# Patient Record
Sex: Male | Born: 1945
Health system: Southern US, Community
[De-identification: ages and names within clinical notes are randomized; demographics above are authoritative.]

## PROBLEM LIST (undated history)

## (undated) DIAGNOSIS — R21 Rash and other nonspecific skin eruption: Secondary | ICD-10-CM

## (undated) DIAGNOSIS — K219 Gastro-esophageal reflux disease without esophagitis: Secondary | ICD-10-CM

## (undated) DIAGNOSIS — H9110 Presbycusis, unspecified ear: Secondary | ICD-10-CM

## (undated) DIAGNOSIS — G3184 Mild cognitive impairment, so stated: Secondary | ICD-10-CM

## (undated) DIAGNOSIS — T7840XA Allergy, unspecified, initial encounter: Secondary | ICD-10-CM

## (undated) DIAGNOSIS — A048 Other specified bacterial intestinal infections: Secondary | ICD-10-CM

## (undated) DIAGNOSIS — K648 Other hemorrhoids: Secondary | ICD-10-CM

## (undated) DIAGNOSIS — Z0000 Encounter for general adult medical examination without abnormal findings: Secondary | ICD-10-CM

## (undated) DIAGNOSIS — F1910 Other psychoactive substance abuse, uncomplicated: Secondary | ICD-10-CM

## (undated) DIAGNOSIS — Z86010 Personal history of colonic polyps: Secondary | ICD-10-CM

## (undated) DIAGNOSIS — D179 Benign lipomatous neoplasm, unspecified: Secondary | ICD-10-CM

## (undated) DIAGNOSIS — H547 Unspecified visual loss: Secondary | ICD-10-CM

## (undated) DIAGNOSIS — F17200 Nicotine dependence, unspecified, uncomplicated: Secondary | ICD-10-CM

## (undated) DIAGNOSIS — B182 Chronic viral hepatitis C: Secondary | ICD-10-CM

## (undated) DIAGNOSIS — M25539 Pain in unspecified wrist: Secondary | ICD-10-CM

## (undated) DIAGNOSIS — H9313 Tinnitus, bilateral: Secondary | ICD-10-CM

## (undated) DIAGNOSIS — R634 Abnormal weight loss: Secondary | ICD-10-CM

## (undated) DIAGNOSIS — M1990 Unspecified osteoarthritis, unspecified site: Secondary | ICD-10-CM

## (undated) HISTORY — DX: Benign lipomatous neoplasm, unspecified: D17.9

## (undated) HISTORY — DX: Chronic viral hepatitis C: B18.2

## (undated) HISTORY — DX: Allergy, unspecified, initial encounter: T78.40XA

## (undated) HISTORY — DX: Rash and other nonspecific skin eruption: R21

## (undated) HISTORY — DX: Other psychoactive substance abuse, uncomplicated: F19.10

## (undated) HISTORY — DX: Unspecified visual loss: H54.7

## (undated) HISTORY — DX: Pain in unspecified wrist: M25.539

## (undated) HISTORY — DX: Other hemorrhoids: K64.8

## (undated) HISTORY — DX: Gastro-esophageal reflux disease without esophagitis: K21.9

## (undated) HISTORY — DX: Personal history of colonic polyps: Z86.010

## (undated) HISTORY — DX: Presbycusis, unspecified ear: H91.10

## (undated) HISTORY — DX: Mild cognitive impairment of uncertain or unknown etiology: G31.84

## (undated) HISTORY — DX: Unspecified osteoarthritis, unspecified site: M19.90

## (undated) HISTORY — DX: Abnormal weight loss: R63.4

## (undated) HISTORY — PX: COLONOSCOPY: SHX174

## (undated) HISTORY — DX: Encounter for general adult medical examination without abnormal findings: Z00.00

## (undated) HISTORY — DX: Nicotine dependence, unspecified, uncomplicated: F17.200

## (undated) HISTORY — DX: Tinnitus, bilateral: H93.13

## (undated) HISTORY — PX: POLYPECTOMY: SHX149

## (undated) HISTORY — DX: Other specified bacterial intestinal infections: A04.8

---

## 1988-04-15 HISTORY — PX: OTHER SURGICAL HISTORY: SHX169

## 2000-08-07 ENCOUNTER — Encounter: Payer: Self-pay | Admitting: Emergency Medicine

## 2000-08-07 ENCOUNTER — Emergency Department (HOSPITAL_COMMUNITY): Admission: EM | Admit: 2000-08-07 | Discharge: 2000-08-07 | Payer: Self-pay | Admitting: Emergency Medicine

## 2006-06-30 ENCOUNTER — Emergency Department (HOSPITAL_COMMUNITY): Admission: EM | Admit: 2006-06-30 | Discharge: 2006-06-30 | Payer: Self-pay | Admitting: Emergency Medicine

## 2008-11-07 ENCOUNTER — Emergency Department (HOSPITAL_COMMUNITY): Admission: EM | Admit: 2008-11-07 | Discharge: 2008-11-07 | Payer: Self-pay | Admitting: Emergency Medicine

## 2009-05-25 ENCOUNTER — Encounter: Payer: Self-pay | Admitting: Internal Medicine

## 2009-06-02 ENCOUNTER — Encounter (INDEPENDENT_AMBULATORY_CARE_PROVIDER_SITE_OTHER): Payer: Self-pay | Admitting: *Deleted

## 2009-06-05 ENCOUNTER — Ambulatory Visit: Payer: Self-pay | Admitting: Internal Medicine

## 2009-06-19 ENCOUNTER — Ambulatory Visit: Payer: Self-pay | Admitting: Internal Medicine

## 2009-06-19 DIAGNOSIS — Z860100 Personal history of colon polyps, unspecified: Secondary | ICD-10-CM | POA: Insufficient documentation

## 2009-06-27 ENCOUNTER — Encounter: Payer: Self-pay | Admitting: Internal Medicine

## 2010-05-15 NOTE — Procedures (Signed)
Summary: Colonoscopy  Patient: Ryan Ortega Note: All result statuses are Final unless otherwise noted.  Tests: (1) Colonoscopy (COL)   COL Colonoscopy           DONE (C)     [facility]     [address]     [place], [place]  [address]           COLONOSCOPY PROCEDURE REPORT           PATIENT:  [name], [name]  MR#:  [id number]     BIRTHDATE:  24-Mar-1946, 63 yrs. old  GENDER:  male           ENDOSCOPIST:  Iva Boop, MD, [facility]     Referred by:  Doran Durand, M.D.           PROCEDURE DATE:  06/19/2009     PROCEDURE:  Colonoscopy with snare polypectomy ADDENDUM: and     biopsy     ASA CLASS:  Class I     INDICATIONS:  Elevated Risk Screening mother had colon cancer in     40's           MEDICATIONS:   Fentanyl 75 mcg IV, Versed 6 mg IV           DESCRIPTION OF PROCEDURE:   After the risks benefits and     alternatives of the procedure were thoroughly explained, informed     consent was obtained.  Digital rectal exam was performed and     revealed no abnormalities and normal prostate.   The LB CF-[id number]     [id number] endoscope was introduced through the anus and advanced to     the cecum, which was identified by both the appendix and ileocecal     valve, without limitations.  The quality of the prep was     excellent, using MoviPrep.  The instrument was then slowly     withdrawn as the colon was fully examined.     Insertion: 2:10 minutes Withdrawal: 9:34 minutes     <<PROCEDUREIMAGES>>           FINDINGS:  A diminutive polyp was found in the descending colon.     It was 3 mm in size. The polyp was removed using cold biopsy     forceps.  A sessile polyp was found in the sigmoid colon. It was 1     cm in size. Polyp was snared, then cauterized with monopolar     cautery. Retrieval was successful. snare polyp  Moderate     diverticulosis was found in the sigmoid colon.  A diverticulum was     found in the cecum.  This was otherwise a normal examination of  the colon.   Retroflexed views in the rectum revealed internal     hemorrhoids.    The scope was then withdrawn from the patient and     the procedure completed.           COMPLICATIONS:  None           ENDOSCOPIC IMPRESSION:     1) 1 cm sessile polyp in the sigmoid colon - removed     2) 3 mm diminutive polyp in the descending colon - removed     3) Moderate diverticulosis in the sigmoid colon     4) Diverticulum in the cecum     5) Internal hemorrhoids     6) Otherwise normal examination, excellent prep     RECOMMENDATIONS:  1) No aspirin or NSAID's for 2 weeks           REPEAT EXAM:  In for Colonoscopy, pending biopsy results.           Iva Boop, MD, [facility]           CC:  Doran Durand MD     The Patient           n.     REVISED:  06/19/2009 01:03 PM     eSIGNED:   Iva Boop at 06/19/2009 01:03 PM           Freddi Starr, [id number]  Note: An exclamation mark (!) indicates a result that was not dispersed into the flowsheet. Document Creation Date: 06/19/2009 1:03 PM _______________________________________________________________________  (1) Order result status: Final Collection or observation date-time: 06/19/2009 12:49 Requested date-time:  Receipt date-time:  Reported date-time:  Referring Physician:   Ordering Physician: Stan Head [phone]) Specimen Source:  Source: Launa Grill Order Number: [id number] Lab site:   Appended Document: Colonoscopy     Procedures Next Due Date:    Colonoscopy: 06/2012

## 2010-05-15 NOTE — Miscellaneous (Signed)
Summary: previsit/rm  Clinical Lists Changes  Medications: Added new medication of MOVIPREP 100 GM  SOLR (PEG-KCL-NACL-NASULF-NA ASC-C) As per prep instructions. - Signed Rx of MOVIPREP 100 GM  SOLR (PEG-KCL-NACL-NASULF-NA ASC-C) As per prep instructions.;  #1 x 0;  Signed;  Entered by: Sherren Kerns RN;  Authorized by: Iva Boop MD, [facility];  Method used: [facility]  [address] [phone]*, [address] [place], [place], [place]  [id number], Ph: [phone] or [phone], Fax: [phone] Observations: Added new observation of ALLERGY REV: Done (06/05/2009 14:47) Added new observation of NKA: T (06/05/2009 14:47)    Prescriptions: MOVIPREP 100 GM  SOLR (PEG-KCL-NACL-NASULF-NA ASC-C) As per prep instructions.  #1 x 0   Entered by:   Sherren Kerns RN   Authorized by:   Iva Boop MD, [facility]   Signed by:   Sherren Kerns RN on 06/05/2009   Method used:   [facility]        [facility]  [address] [phone]* (retail)       [address]       [place], [place]  [id number]       Ph: [phone] or [phone]       Fax: [phone]   RxID:   [id number]

## 2010-05-15 NOTE — Letter (Signed)
Summary: [facility] Instructions  [facility]  [address] [place], [place] [address]   Phone: [phone]  Fax: [phone]       Ryan Ortega    03/08/1946    MRN: [id number]        Procedure Day Dorna Bloom:  Duanne Limerick  11:30AM     Arrival Time:  10:30AM     Procedure Time:  11:30AM     Location of Procedure:                    Juliann Pares _  [facility] (4th Floor)   PREPARATION FOR COLONOSCOPY WITH MOVIPREP   Starting 5 days prior to your procedure 06/14/09 do not eat nuts, seeds, popcorn, corn, beans, peas,  salads, or any raw vegetables.  Do not take any fiber supplements (e.g. Metamucil, Citrucel, and Benefiber).  THE DAY BEFORE YOUR PROCEDURE         DATE: 06/18/09  DAY: SUNDAY  1.  Drink clear liquids the entire day-NO SOLID FOOD  2.  Do not drink anything colored red or purple.  Avoid juices with pulp.  No orange juice.  3.  Drink at least 64 oz. (8 glasses) of fluid/clear liquids during the day to prevent dehydration and help the prep work efficiently.  CLEAR LIQUIDS INCLUDE: Water Jello Ice Popsicles Tea (sugar ok, no milk/cream) Powdered fruit flavored drinks Coffee (sugar ok, no milk/cream) Gatorade Juice: apple, white grape, white cranberry  Lemonade Clear bullion, consomm, broth Carbonated beverages (any kind) Strained chicken noodle soup Hard Candy                             4.  In the morning, mix first dose of MoviPrep solution:    Empty 1 Pouch A and 1 Pouch B into the disposable container    Add lukewarm drinking water to the top line of the container. Mix to dissolve    Refrigerate (mixed solution should be used within 24 hrs)  5.  Begin drinking the prep at 5:00 p.m. The MoviPrep container is divided by 4 marks.   Every 15 minutes drink the solution down to the next mark (approximately 8 oz) until the full liter is complete.   6.  Follow completed prep with 16 oz of clear liquid of your choice (Nothing red or purple).   Continue to drink clear liquids until bedtime.  7.  Before going to bed, mix second dose of MoviPrep solution:    Empty 1 Pouch A and 1 Pouch B into the disposable container    Add lukewarm drinking water to the top line of the container. Mix to dissolve    Refrigerate  THE DAY OF YOUR PROCEDURE      DATE: 06/19/09  GBT:DVVOHY  Beginning at 6:30AM (5 hours before procedure):         1. Every 15 minutes, drink the solution down to the next mark (approx 8 oz) until the full liter is complete.  2. Follow completed prep with 16 oz. of clear liquid of your choice.    3. You may drink clear liquids until 9:30AM (2 HOURS BEFORE PROCEDURE).   MEDICATION INSTRUCTIONS  Unless otherwise instructed, you should take regular prescription medications with a small sip of water   as early as possible the morning of your procedure.      Additional medication instructions:  n/a         OTHER  INSTRUCTIONS  You will need a responsible adult at least 66 years of age to accompany you and drive you home.   This person must remain in the waiting room during your procedure.  Wear loose fitting clothing that is easily removed.  Leave jewelry and other valuables at home.  However, you may wish to bring a book to read or  an iPod/MP3 player to listen to music as you wait for your procedure to start.  Remove all body piercing jewelry and leave at home.  Total time from sign-in until discharge is approximately 2-3 hours.  You should go home directly after your procedure and rest.  You can resume normal activities the  day after your procedure.  The day of your procedure you should not:   Drive   Make legal decisions   Operate machinery   Drink alcohol   Return to work  You will receive specific instructions about eating, activities and medications before you leave.    The above instructions have been reviewed and explained to me by   Sherren Kerns RN  June 05, 2009 3:13  PM      I fully understand and can verbalize these instructions _____________________________ Date _________

## 2010-05-15 NOTE — Letter (Signed)
Summary: Patient Notice- Polyp Results  [facility]  [address] [place], [place] [address]   Phone: [phone]  Fax: [phone]        June 27, 2009 MRN: [id number]    Ryan Ortega [address] [place], [place]  [address]    Dear Ryan Ortega,  One of the polyps removed from your colon was adenomatous. This means that it was pre-cancerous or that  they had the potential to change into cancer over time. The other polyp was not a true colon polyp.  I recommend that you have a repeat colonoscopy in 3 years to determine if you have developed any new polyps over time. If you develop any new rectal bleeding, abdominal pain or significant bowel habit changes, please contact us before then.  In addition to repeating colonoscopy, changing health habits may reduce your risk of having more colon polyps and possibly, colon cancer. You may lower your risk of future polyps and colon cancer by adopting healthy habits such as not smoking or using tobacco (if you do), being physically active, losing weight (if overweight), and eating a diet which includes fruits and vegetables and limits red meat.  Please call us if you are having persistent problems or have questions about your condition that have not been fully answered at this time.   Sincerely,  Iva Boop MD, [facility]  This letter has been electronically signed by your physician.  Appended Document: Patient Notice- Polyp Results Letter mailed 3.16.11

## 2010-05-15 NOTE — Letter (Signed)
Summary: Pt's Hx/[facility]  Pt's Hx/[facility]   Imported By: Sherian Rein 06/26/2009 11:31:15  _____________________________________________________________________  External Attachment:    Type:   Image     Comment:   External Document

## 2010-05-15 NOTE — Letter (Signed)
Summary: [facility]   Imported By: Sherian Rein 06/26/2009 11:29:49  _____________________________________________________________________  External Attachment:    Type:   Image     Comment:   External Document

## 2010-07-22 LAB — CBC
HCT: 40.4 % (ref 39.0–52.0)
Hemoglobin: 14 g/dL (ref 13.0–17.0)
MCHC: 34.6 g/dL (ref 30.0–36.0)
MCV: 98.8 fL (ref 78.0–100.0)
Platelets: 152 10*3/uL (ref 150–400)
RBC: 4.09 MIL/uL — ABNORMAL LOW (ref 4.22–5.81)
RDW: 12.9 % (ref 11.5–15.5)
WBC: 7.9 10*3/uL (ref 4.0–10.5)

## 2010-07-22 LAB — POCT I-STAT, CHEM 8
BUN: 7 mg/dL (ref 6–23)
Calcium, Ion: 1.07 mmol/L — ABNORMAL LOW (ref 1.12–1.32)
Chloride: 101 mEq/L (ref 96–112)
Creatinine, Ser: 1 mg/dL (ref 0.4–1.5)
Glucose, Bld: 94 mg/dL (ref 70–99)
HCT: 44 % (ref 39.0–52.0)
Hemoglobin: 15 g/dL (ref 13.0–17.0)
Potassium: 3.7 mEq/L (ref 3.5–5.1)
Sodium: 134 mEq/L — ABNORMAL LOW (ref 135–145)
TCO2: 19 mmol/L (ref 0–100)

## 2010-07-22 LAB — DIFFERENTIAL
Basophils Absolute: 0 10*3/uL (ref 0.0–0.1)
Basophils Relative: 0 % (ref 0–1)
Eosinophils Absolute: 0 10*3/uL (ref 0.0–0.7)
Eosinophils Relative: 1 % (ref 0–5)
Lymphocytes Relative: 19 % (ref 12–46)
Lymphs Abs: 1.5 10*3/uL (ref 0.7–4.0)
Monocytes Absolute: 0.2 10*3/uL (ref 0.1–1.0)
Monocytes Relative: 3 % (ref 3–12)
Neutro Abs: 6.1 10*3/uL (ref 1.7–7.7)
Neutrophils Relative %: 77 % (ref 43–77)

## 2010-07-22 LAB — PROTIME-INR
INR: 1 (ref 0.00–1.49)
Prothrombin Time: 13.8 seconds (ref 11.6–15.2)

## 2010-07-22 LAB — ETHANOL: Alcohol, Ethyl (B): 171 mg/dL — ABNORMAL HIGH (ref 0–10)

## 2010-07-22 LAB — APTT: aPTT: 29 seconds (ref 24–37)

## 2011-08-22 ENCOUNTER — Ambulatory Visit: Payer: Self-pay | Admitting: Gastroenterology

## 2012-03-06 ENCOUNTER — Other Ambulatory Visit: Payer: Self-pay | Admitting: Internal Medicine

## 2012-03-06 ENCOUNTER — Ambulatory Visit
Admission: RE | Admit: 2012-03-06 | Discharge: 2012-03-06 | Disposition: A | Discharge status: Home / Self Care | Payer: Medicare Other | Source: Ambulatory Visit | Attending: Internal Medicine | Admitting: Internal Medicine

## 2012-03-06 DIAGNOSIS — M25562 Pain in left knee: Secondary | ICD-10-CM

## 2012-06-25 ENCOUNTER — Encounter: Payer: Self-pay | Admitting: Internal Medicine

## 2012-06-25 DIAGNOSIS — Z860100 Personal history of colon polyps, unspecified: Secondary | ICD-10-CM

## 2012-06-25 HISTORY — DX: Personal history of colonic polyps: Z86.010

## 2012-06-25 HISTORY — DX: Personal history of colon polyps, unspecified: Z86.0100

## 2012-06-26 ENCOUNTER — Encounter: Payer: Self-pay | Admitting: Internal Medicine

## 2012-07-13 ENCOUNTER — Encounter: Payer: Self-pay | Admitting: Internal Medicine

## 2012-07-31 ENCOUNTER — Encounter: Payer: Self-pay | Admitting: *Deleted

## 2012-08-03 ENCOUNTER — Encounter: Payer: Self-pay | Admitting: Internal Medicine

## 2012-08-03 ENCOUNTER — Ambulatory Visit (INDEPENDENT_AMBULATORY_CARE_PROVIDER_SITE_OTHER): Payer: Medicare Other | Admitting: Internal Medicine

## 2012-08-03 VITALS — BP 130/70 | HR 96 | Temp 97.9°F | Resp 18 | Ht 66.0 in | Wt 135.0 lb

## 2012-08-03 DIAGNOSIS — M47812 Spondylosis without myelopathy or radiculopathy, cervical region: Secondary | ICD-10-CM

## 2012-08-03 DIAGNOSIS — M4712 Other spondylosis with myelopathy, cervical region: Secondary | ICD-10-CM

## 2012-08-03 DIAGNOSIS — B182 Chronic viral hepatitis C: Secondary | ICD-10-CM

## 2012-08-03 DIAGNOSIS — Z13220 Encounter for screening for lipoid disorders: Secondary | ICD-10-CM

## 2012-08-03 MED ORDER — PNEUMOCOCCAL VAC POLYVALENT 25 MCG/0.5ML IJ INJ
0.5000 mL | INJECTION | Freq: Once | INTRAMUSCULAR | Status: AC
  Administered 2012-08-03: 0.5 mL via INTRAMUSCULAR

## 2012-08-03 MED ORDER — TRAMADOL HCL 50 MG PO TABS: 50.0000 mg | ORAL_TABLET | Freq: Three times a day (TID) | ORAL | Status: DC | PRN

## 2012-08-03 NOTE — Progress Notes (Signed)
Patient ID: Ryan Ortega, male   DOB: Mar 11, 1946, 67 y.o.   MRN: [id number]   No Known Allergies  Chief Complaint  Patient presents with  . Medical Managment of Chronic Issues    sinus and allergy trouble    HPI: Patient is a 67 y.o. AA male seen in the office today for management of chronic diseases.  C/o left knee pain unrelieved with meloxicam--worst at night and keeps him awake.  Also notes sinus and allergy problems.  Continues to smoke cigarettes.  No hematochezia or melena.  Has cscope next month.  Goes every 4 years.    Review of Systems:  Review of Systems  Constitutional: Negative for fever.  HENT: Positive for congestion and neck pain.   Eyes: Negative for blurred vision.  Respiratory: Negative for shortness of breath.   Cardiovascular: Negative for chest pain and palpitations.  Gastrointestinal: Negative for abdominal pain, constipation, blood in stool and melena.  Genitourinary: Negative for dysuria.  Musculoskeletal: Positive for myalgias. Negative for falls.  Skin: Negative for rash.  Neurological: Positive for tingling, sensory change and headaches.  Psychiatric/Behavioral: Negative for memory loss.     Past Medical History  Diagnosis Date  . Personal history of colonic adenoma 06/25/2012  . Chronic hepatitis C without mention of hepatic coma   . Pain in joint, forearm   . Lipoma of unspecified site   . Mild cognitive impairment, so stated   . Unspecified visual loss   . Tobacco use disorder   . Tinnitus of both ears   . Hearing loss of aging   . Internal hemorrhoids without mention of complication   . Osteoarthrosis, unspecified whether generalized or localized, unspecified site   . Rash and other nonspecific skin eruption   . Loss of weight   . Routine general medical examination at a health care facility    Past Surgical History  Procedure Laterality Date  . Left shoulder  1990   Social History:   reports that he has been smoking Cigarettes.  He has  a 40 pack-year smoking history. He does not have any smokeless tobacco history on file. He reports that he drinks about 10.8 ounces of alcohol per week. He reports that he uses illicit drugs (Marijuana) about 5 times per week.  Family History  Problem Relation Age of Onset  . Cancer Mother   . Stroke Sister     Medications: Patient's Medications  New Prescriptions   No medications on file  Previous Medications   MELOXICAM (MOBIC) 15 MG TABLET    Take 15 mg by mouth daily. Take one tablet once a day for pain  Modified Medications   No medications on file  Discontinued Medications   No medications on file     Physical Exam:  Filed Vitals:   08/03/12 1347  BP: 130/70  Pulse: 96  Temp: 97.9 F (36.6 C)  TempSrc: Oral  Resp: 18  Height: 5\' 6"  (1.676 m)  Weight: 135 lb (61.236 kg)  SpO2: 95%   Physical Exam  Constitutional: He is oriented to person, place, and time. No distress.  HENT:  Head: Normocephalic and atraumatic.  Eyes: EOM are normal. Pupils are equal, round, and reactive to light.  Neck:  Decreased ROM due to pain, has radiation down arms, some thenar atrophy  Cardiovascular: Normal rate, regular rhythm, normal heart sounds and intact distal pulses.   Pulmonary/Chest: Effort normal and breath sounds normal. No respiratory distress.  Abdominal: Soft. Bowel sounds are normal. He exhibits  no distension. There is no tenderness.  Musculoskeletal: He exhibits tenderness. He exhibits no edema.  Neurological: He is alert and oriented to person, place, and time.    Assessment/Plan 1. Cervical osteoarthritis - needs imaging - traMADol (ULTRAM) 50 MG tablet; Take 1 tablet (50 mg total) by mouth every 8 (eight) hours as needed for pain.  Dispense: 90 tablet; Refill: 3 - DG Cervical Spine Complete; Future  2. Spondylosis, cervical, with myelopathy - has difficulty with weakness and dropping items due to pain -review of ED records shows all visits were pain related -  DG Cervical Spine Complete; Future  3. Need for lipid screening - check Lipid panel  4. Hepatitis C, chronic - CBC with Differential - CMP - pt is over 65 and immunocompromised due to chronic hep c so given his pneumococcal 23 valent vaccine (PNU-IMMUNE) injection 0.5 mL; Inject 0.5 mLs into the muscle once.  Labs/tests ordered:  cspine xrays, cbc, cmp, lipids

## 2012-08-17 ENCOUNTER — Encounter: Payer: Self-pay | Admitting: Internal Medicine

## 2012-08-17 ENCOUNTER — Ambulatory Visit (AMBULATORY_SURGERY_CENTER): Payer: Medicare Other | Admitting: *Deleted

## 2012-08-17 VITALS — Ht 65.0 in | Wt 131.3 lb

## 2012-08-17 DIAGNOSIS — Z860100 Personal history of colon polyps, unspecified: Secondary | ICD-10-CM

## 2012-08-17 DIAGNOSIS — Z1211 Encounter for screening for malignant neoplasm of colon: Secondary | ICD-10-CM

## 2012-08-17 MED ORDER — NA SULFATE-K SULFATE-MG SULF 17.5-3.13-1.6 GM/177ML PO SOLN: ORAL | Status: DC

## 2012-08-31 ENCOUNTER — Ambulatory Visit (AMBULATORY_SURGERY_CENTER): Payer: Medicare Other | Admitting: Internal Medicine

## 2012-08-31 ENCOUNTER — Encounter: Payer: Self-pay | Admitting: Internal Medicine

## 2012-08-31 VITALS — BP 130/73 | HR 69 | Temp 97.9°F | Resp 29 | Ht 65.0 in | Wt 131.0 lb

## 2012-08-31 DIAGNOSIS — D126 Benign neoplasm of colon, unspecified: Secondary | ICD-10-CM

## 2012-08-31 DIAGNOSIS — Z86010 Personal history of colonic polyps: Secondary | ICD-10-CM

## 2012-08-31 DIAGNOSIS — K648 Other hemorrhoids: Secondary | ICD-10-CM

## 2012-08-31 DIAGNOSIS — Z1211 Encounter for screening for malignant neoplasm of colon: Secondary | ICD-10-CM

## 2012-08-31 MED ORDER — SODIUM CHLORIDE 0.9 % IV SOLN: 500.0000 mL | INTRAVENOUS | Status: DC

## 2012-08-31 NOTE — Patient Instructions (Addendum)
I found and removed two tiny polyps from the colon. You also have hemorrhoids.  I will let you know pathology results and when to have another routine colonoscopy by mail.  Iva Boop, MD, FACG  YOU HAD AN ENDOSCOPIC PROCEDURE TODAY AT THE [facility]: Refer to the procedure report that was given to you for any specific questions about what was found during the examination.  If the procedure report does not answer your questions, please call your gastroenterologist to clarify.  If you requested that your care partner not be given the details of your procedure findings, then the procedure report has been included in a sealed envelope for you to review at your convenience later.  YOU SHOULD EXPECT: Some feelings of bloating in the abdomen. Passage of more gas than usual.  Walking can help get rid of the air that was put into your GI tract during the procedure and reduce the bloating. If you had a lower endoscopy (such as a colonoscopy or flexible sigmoidoscopy) you may notice spotting of blood in your stool or on the toilet paper. If you underwent a bowel prep for your procedure, then you may not have a normal bowel movement for a few days.  DIET: Your first meal following the procedure should be a light meal and then it is ok to progress to your normal diet.  A half-sandwich or bowl of soup is an example of a good first meal.  Heavy or fried foods are harder to digest and may make you feel nauseous or bloated.  Likewise meals heavy in dairy and vegetables can cause extra gas to form and this can also increase the bloating.  Drink plenty of fluids but you should avoid alcoholic beverages for 24 hours.  ACTIVITY: Your care partner should take you home directly after the procedure.  You should plan to take it easy, moving slowly for the rest of the day.  You can resume normal activity the day after the procedure however you should NOT DRIVE or use heavy machinery for 24 hours (because of  the sedation medicines used during the test).    SYMPTOMS TO REPORT IMMEDIATELY: A gastroenterologist can be reached at any hour.  During normal business hours, 8:30 AM to 5:00 PM Monday through Friday, call [phone].  After hours and on weekends, please call the GI answering service at [phone] who will take a message and have the physician on call contact you.   Following lower endoscopy (colonoscopy or flexible sigmoidoscopy):  Excessive amounts of blood in the stool  Significant tenderness or worsening of abdominal pains  Swelling of the abdomen that is new, acute  Fever of 100F or higher   FOLLOW UP: If any biopsies were taken you will be contacted by phone or by letter within the next 1-3 weeks.  Call your gastroenterologist if you have not heard about the biopsies in 3 weeks.  Our staff will call the home number listed on your records the next business day following your procedure to check on you and address any questions or concerns that you may have at that time regarding the information given to you following your procedure. This is a courtesy call and so if there is no answer at the home number and we have not heard from you through the emergency physician on call, we will assume that you have returned to your regular daily activities without incident.  SIGNATURES/CONFIDENTIALITY: You and/or your care partner have signed paperwork which  will be entered into your electronic medical record.  These signatures attest to the fact that that the information above on your After Visit Summary has been reviewed and is understood.  Full responsibility of the confidentiality of this discharge information lies with you and/or your care-partner.  Polyps, hemorrhoids-handout given  Repeat colonoscopy will be determined by pathology

## 2012-08-31 NOTE — Progress Notes (Signed)
Patient did not experience any of the following events: a burn prior to discharge; a fall within the facility; wrong site/side/patient/procedure/implant event; or a hospital transfer or hospital admission upon discharge from the facility. (G8907)Patient did not have preoperative order for IV antibiotic SSI prophylaxis. (G8918) ewm 

## 2012-08-31 NOTE — Op Note (Signed)
[facility] [address] [place], [address]   COLONOSCOPY PROCEDURE REPORT  PATIENT: Ryan, Ortega  MR#: [id number] BIRTHDATE: May 11, 1945 , 67  yrs. old GENDER: Male ENDOSCOPIST: Iva Boop, MD, [facility] PROCEDURE DATE:  08/31/2012 PROCEDURE:   Colonoscopy with snare polypectomy ASA CLASS:   Class III INDICATIONS:Screening and surveillance,personal history of colonic polyps.   Last colonoscopy 2011 (3 yrs ago) MEDICATIONS: propofol (Diprivan) 300mg  IV, MAC sedation, administered by CRNA, and These medications were titrated to patient response per physician's verbal order  DESCRIPTION OF PROCEDURE:   After the risks benefits and alternatives of the procedure were thoroughly explained, informed consent was obtained.  A digital rectal exam revealed no abnormalities of the rectum, A digital rectal exam revealed no prostatic nodules, and A digital rectal exam revealed the prostate was not enlarged.   The LB [id number]  endoscope was introduced through the anus and advanced to the cecum, which was identified by both the appendix and ileocecal valve. No adverse events experienced.   The quality of the prep was excellent using Suprep  The instrument was then slowly withdrawn as the colon was fully examined.      COLON FINDINGS: Two diminutive sessile polyps were found in the descending colon and rectum.  A polypectomy was performed with a cold snare.  The resection was complete and the polyp tissue was completely retrieved.   The colon mucosa was otherwise normal. Retroflexed views revealed internal hemorrhoids. The time to cecum=3 minutes 08 seconds.  Withdrawal time=9 minutes 00 seconds. The scope was withdrawn and the procedure completed. COMPLICATIONS: There were no complications.  ENDOSCOPIC IMPRESSION: 1.   Two diminutive sessile polyps were found in the descending colon and rectum; polypectomy was performed with a cold snare 2.   The colon mucosa  was otherwise normal - excellent prep 3.   Internal hemorrhoids - in the rectum  RECOMMENDATIONS: Timing of repeat colonoscopy will be determined by pathology findings in a patient w/ hx of 1 cm adenoma removed 2011   eSigned:  Iva Boop, MD, [facility] 08/31/2012 11:51 AM cc: The Patient

## 2012-08-31 NOTE — Progress Notes (Signed)
Called to room to assist during endoscopic procedure.  Patient ID and intended procedure confirmed with present staff. Received instructions for my participation in the procedure from the performing physician.  

## 2012-09-01 ENCOUNTER — Telehealth: Payer: Self-pay | Admitting: *Deleted

## 2012-09-01 NOTE — Telephone Encounter (Signed)
  Follow up Call-  Call back number 08/31/2012  Post procedure Call Back phone  # [phone]  Permission to leave phone message Yes     Patient questions:  Do you have a fever, pain , or abdominal swelling? no Pain Score  0 *  Have you tolerated food without any problems? yes  Have you been able to return to your normal activities? yes  Do you have any questions about your discharge instructions: Diet   no Medications  no Follow up visit  no  Do you have questions or concerns about your Care? no  Actions: * If pain score is 4 or above: No action needed, pain <4.

## 2012-09-03 ENCOUNTER — Encounter: Payer: Self-pay | Admitting: Internal Medicine

## 2012-09-03 NOTE — Progress Notes (Signed)
Quick Note:  2 adenomas - diminutive Next colonoscopy about 08/2017 ______

## 2013-03-19 ENCOUNTER — Other Ambulatory Visit: Payer: Self-pay | Admitting: Internal Medicine

## 2013-04-16 ENCOUNTER — Ambulatory Visit: Payer: Medicare Other | Admitting: Internal Medicine

## 2013-05-07 ENCOUNTER — Ambulatory Visit: Payer: Medicare Other | Admitting: Internal Medicine

## 2013-05-17 ENCOUNTER — Encounter: Payer: Self-pay | Admitting: Internal Medicine

## 2013-05-17 ENCOUNTER — Ambulatory Visit (INDEPENDENT_AMBULATORY_CARE_PROVIDER_SITE_OTHER): Payer: Medicare Other | Admitting: Internal Medicine

## 2013-05-17 ENCOUNTER — Ambulatory Visit
Admission: RE | Admit: 2013-05-17 | Discharge: 2013-05-17 | Disposition: A | Discharge status: Home / Self Care | Payer: Medicare Other | Source: Ambulatory Visit | Attending: Internal Medicine | Admitting: Internal Medicine

## 2013-05-17 VITALS — BP 120/68 | HR 80 | Temp 97.9°F | Wt 126.8 lb

## 2013-05-17 DIAGNOSIS — F1210 Cannabis abuse, uncomplicated: Secondary | ICD-10-CM

## 2013-05-17 DIAGNOSIS — Z23 Encounter for immunization: Secondary | ICD-10-CM

## 2013-05-17 DIAGNOSIS — B182 Chronic viral hepatitis C: Secondary | ICD-10-CM

## 2013-05-17 DIAGNOSIS — M47812 Spondylosis without myelopathy or radiculopathy, cervical region: Secondary | ICD-10-CM

## 2013-05-17 DIAGNOSIS — M4712 Other spondylosis with myelopathy, cervical region: Secondary | ICD-10-CM

## 2013-05-17 DIAGNOSIS — F1290 Cannabis use, unspecified, uncomplicated: Secondary | ICD-10-CM

## 2013-05-17 MED ORDER — GABAPENTIN 100 MG PO CAPS: 100.0000 mg | ORAL_CAPSULE | Freq: Three times a day (TID) | ORAL | Status: DC

## 2013-05-17 MED ORDER — TETANUS-DIPHTH-ACELL PERTUSSIS 5-2.5-18.5 LF-MCG/0.5 IM SUSP: 0.5000 mL | Freq: Once | INTRAMUSCULAR | Status: DC

## 2013-05-17 NOTE — Progress Notes (Signed)
Patient ID: Ryan Ortega, male   DOB: 04-08-1946, 68 y.o.   MRN: [id number]   Location:  [facility] / [place]  No Known Allergies  Chief Complaint  Patient presents with  . Acute Visit    Lt shoulder/neck pain on-going getting worse  . Immunizations    RX for Tdap to be printed  . other    depression screening done.    HPI: Patient is a 68 y.o.  seen in the office today for acute visit.  He has not been seen for over a year.  Last time, he was seen for cervical myelopathy and imaging studies were ordered.  Did not have the neck xrays done last time.  Now left shoulder and neck pain is getting worse.  Still dropping items with his right hand.  Legs also feel weaker.  Right hand numb all of the time--4 fingers.  Tramadol helped pain a lot.  Does not have any.  Pain remains.    Says mood is fine.  He is not depressed.   Review of Systems:  Review of Systems  Constitutional: Negative for malaise/fatigue.  HENT: Negative for congestion.   Eyes: Negative for blurred vision.  Respiratory: Negative for shortness of breath.   Cardiovascular: Negative for chest pain.  Gastrointestinal: Negative for constipation.  Genitourinary: Negative for dysuria.  Musculoskeletal: Negative for falls.  Skin: Negative for rash.  Neurological: Positive for tingling, sensory change and focal weakness.  Psychiatric/Behavioral: Negative for depression and memory loss.    Past Medical History  Diagnosis Date  . Personal history of colonic adenoma 06/25/2012  . Chronic hepatitis C without mention of hepatic coma   . Pain in joint, forearm   . Lipoma of unspecified site   . Mild cognitive impairment, so stated   . Unspecified visual loss   . Tobacco use disorder   . Tinnitus of both ears   . Hearing loss of aging   . Internal hemorrhoids without mention of complication   . Osteoarthrosis, unspecified whether generalized or localized, unspecified site   . Rash and other  nonspecific skin eruption   . Loss of weight   . Routine general medical examination at a health care facility   . Allergy   . Substance abuse     Past Surgical History  Procedure Laterality Date  . Left shoulder  1990  . Colonoscopy      Social History:   reports that he has been smoking Cigarettes.  He has a 40 pack-year smoking history. He has never used smokeless tobacco. He reports that he drinks about 10.8 ounces of alcohol per week. He reports that he uses illicit drugs (Marijuana) about 5 times per week.  Family History  Problem Relation Age of Onset  . Cancer Mother   . Stroke Sister   . Breast cancer Sister   . Colitis Neg Hx   . Esophageal cancer Neg Hx   . Stomach cancer Neg Hx   . Rectal cancer Neg Hx     Medications: Patient's Medications  New Prescriptions   No medications on file  Previous Medications   MELOXICAM (MOBIC) 15 MG TABLET    Take 15 mg by mouth daily. Take one tablet once a day for pain   TDAP (BOOSTRIX) 5-2.5-18.5 LF-MCG/0.5 INJECTION    Inject 0.5 mLs into the muscle once.   TRAMADOL (ULTRAM) 50 MG TABLET    TAKE 1 TABLET BY MOUTH EVERY 8 HOURS AS NEEDED  Modified  Medications   No medications on file  Discontinued Medications   No medications on file     Physical Exam: Filed Vitals:   05/17/13 1042  BP: 120/68  Pulse: 80  Temp: 97.9 F (36.6 C)  TempSrc: Oral  Weight: 126 lb 12.8 oz (57.516 kg)  SpO2: 97%   Physical Exam  Constitutional: He is oriented to person, place, and time. No distress.  HENT:  Head: Normocephalic and atraumatic.  Cardiovascular: Normal rate, regular rhythm, normal heart sounds and intact distal pulses.   Pulmonary/Chest: Effort normal and breath sounds normal.  Abdominal: Soft. Bowel sounds are normal. He exhibits no distension and no mass. There is no tenderness.  Musculoskeletal: He exhibits no edema and no tenderness.  Weakness of right hand, atrophy of thenar muscles  Neurological: He is alert and  oriented to person, place, and time.  Skin: Skin is warm and dry.  Psychiatric:  Sherran Needs sort of attitude    Assessment/Plan 1. Spondylosis, cervical, with myelopathy -due to worsening symptoms, will obtain xrays to further evaluate his neck pain and arm weakness, dropping things - CBC with Differential - Comprehensive metabolic panel - Sedimentation Rate - DG Cervical Spine Complete; Future - Drug Screen, Urine - gabapentin (NEURONTIN) 100 MG capsule; Take 1 capsule (100 mg total) by mouth 3 (three) times daily.  Dispense: 90 capsule; Refill: 3 prescribed to help with pain  2. Cervical osteoarthritis - noted to be cause on previous imaging, and now worse - CBC with Differential - Comprehensive metabolic panel - Sedimentation Rate - DG Cervical Spine Complete; Future - gabapentin (NEURONTIN) 100 MG capsule; Take 1 capsule (100 mg total) by mouth 3 (three) times daily.  Dispense: 90 capsule; Refill: 3  3. Marijuana use -continues, he admits to this--discussed it should not be combined with narcotics, benzos, etc due to potential respiratory depression, interactions - CBC with Differential - Comprehensive metabolic panel - Drug Screen, Urine  4. Hepatitis C -not treated, would benefit from treatment in the future now that there are more available options--suspect his financial situation will interfere with potential treatment options - CBC with Differential - Comprehensive metabolic panel  5. Need for tetanus booster -script given for tdap  Labs/tests ordered:   Orders Placed This Encounter  Procedures  . DG Cervical Spine Complete    Standing Status: Future     Number of Occurrences: 1     Standing Expiration Date: 07/16/2014    Order Specific Question:  Reason for Exam (SYMPTOM  OR DIAGNOSIS REQUIRED)    Answer:  ongoing numbness, tingling, weakness of upper extremities, pain in neck    Order Specific Question:  Preferred imaging location?    Answer:  [facility]  . CBC with Differential  . Comprehensive metabolic panel  . Sedimentation Rate  . Drug Screen, Urine    Next appt: 2 wks, f/u on neck pain

## 2013-05-17 NOTE — Patient Instructions (Signed)
Please go get your xray of your neck to figure out why you are dropping things and getting weaker in your arms.  This will help me figure out what you need to best manage your pain.

## 2013-05-18 LAB — COMPREHENSIVE METABOLIC PANEL
ALT: 29 IU/L (ref 0–44)
AST: 33 IU/L (ref 0–40)
Albumin/Globulin Ratio: 1.6 (ref 1.1–2.5)
Albumin: 4.6 g/dL (ref 3.6–4.8)
Alkaline Phosphatase: 77 IU/L (ref 39–117)
BUN/Creatinine Ratio: 13 (ref 10–22)
BUN: 13 mg/dL (ref 8–27)
CO2: 19 mmol/L (ref 18–29)
Calcium: 9.7 mg/dL (ref 8.6–10.2)
Chloride: 104 mmol/L (ref 97–108)
Creatinine, Ser: 0.99 mg/dL (ref 0.76–1.27)
GFR calc Af Amer: 90 mL/min/{1.73_m2} (ref >59)
GFR calc non Af Amer: 78 mL/min/{1.73_m2} (ref >59)
Globulin, Total: 2.9 g/dL (ref 1.5–4.5)
Glucose: 77 mg/dL (ref 65–99)
Potassium: 3.9 mmol/L (ref 3.5–5.2)
Sodium: 140 mmol/L (ref 134–144)
Total Bilirubin: 0.9 mg/dL (ref 0.0–1.2)
Total Protein: 7.5 g/dL (ref 6.0–8.5)

## 2013-05-18 LAB — DRUG SCREEN, URINE
Amphetamines, Urine: NEGATIVE ng/mL
Barbiturates: NEGATIVE ng/mL
Benzodiazepines: NEGATIVE ng/mL
Cannabinoid: POSITIVE ng/mL
Cocaine Metabolite: NEGATIVE ng/mL
Opiates: NEGATIVE ng/mL
Phencyclidine: NEGATIVE ng/mL

## 2013-05-18 LAB — CBC WITH DIFFERENTIAL/PLATELET
Basophils Absolute: 0 10*3/uL (ref 0.0–0.2)
Basos: 0 %
Eos: 3 %
Eosinophils Absolute: 0.2 10*3/uL (ref 0.0–0.4)
HCT: 40.7 % (ref 37.5–51.0)
Hemoglobin: 13.8 g/dL (ref 12.6–17.7)
Immature Grans (Abs): 0 10*3/uL (ref 0.0–0.1)
Immature Granulocytes: 0 %
Lymphocytes Absolute: 2.3 10*3/uL (ref 0.7–3.1)
Lymphs: 31 %
MCH: 33.4 pg — ABNORMAL HIGH (ref 26.6–33.0)
MCHC: 33.9 g/dL (ref 31.5–35.7)
MCV: 99 fL — ABNORMAL HIGH (ref 79–97)
Monocytes Absolute: 0.6 10*3/uL (ref 0.1–0.9)
Monocytes: 8 %
Neutrophils Absolute: 4.2 10*3/uL (ref 1.4–7.0)
Neutrophils Relative %: 58 %
RBC: 4.13 x10E6/uL — ABNORMAL LOW (ref 4.14–5.80)
RDW: 12.6 % (ref 12.3–15.4)
WBC: 7.4 10*3/uL (ref 3.4–10.8)

## 2013-05-18 LAB — SEDIMENTATION RATE: Sed Rate: 19 mm/hr (ref 0–30)

## 2013-05-25 ENCOUNTER — Encounter: Payer: Self-pay | Admitting: *Deleted

## 2013-06-03 ENCOUNTER — Ambulatory Visit (INDEPENDENT_AMBULATORY_CARE_PROVIDER_SITE_OTHER): Payer: Medicare Other | Admitting: Nurse Practitioner

## 2013-06-03 ENCOUNTER — Encounter: Payer: Self-pay | Admitting: Nurse Practitioner

## 2013-06-03 VITALS — BP 126/80 | HR 84 | Temp 97.5°F | Wt 127.0 lb

## 2013-06-03 DIAGNOSIS — M4712 Other spondylosis with myelopathy, cervical region: Secondary | ICD-10-CM

## 2013-06-03 NOTE — Progress Notes (Signed)
Patient ID: Ryan Ortega, male   DOB: [birth year], 68 y.o.   MRN: [id number]    No Known Allergies  Chief Complaint  Patient presents with  . Follow-up    2 week follow-up, no concerns     HPI: Patient is a 68 y.o. male seen in the office today for follow up on neck pain and left sided weakness; Patient's xray is consistent with significant arthritis and was given Rx for gabapentin 100mg  po tid for the pain.  Reports medication is helping with pain but still dropping items with his right hand. Legs are better. Right hand is still numb all of the time.   Review of Systems:  Review of Systems  Constitutional: Negative for fever, chills and weight loss.  Respiratory: Negative for shortness of breath.   Cardiovascular: Negative for chest pain.  Gastrointestinal: Negative for diarrhea and constipation.  Genitourinary: Negative for dysuria.  Musculoskeletal: Positive for neck pain. Negative for myalgias.  Neurological: Positive for dizziness, tingling, sensory change and focal weakness. Negative for weakness and headaches.     Past Medical History  Diagnosis Date  . Personal history of colonic adenoma 06/25/2012  . Chronic hepatitis C without mention of hepatic coma   . Pain in joint, forearm   . Lipoma of unspecified site   . Mild cognitive impairment, so stated   . Unspecified visual loss   . Tobacco use disorder   . Tinnitus of both ears   . Hearing loss of aging   . Internal hemorrhoids without mention of complication   . Osteoarthrosis, unspecified whether generalized or localized, unspecified site   . Rash and other nonspecific skin eruption   . Loss of weight   . Routine general medical examination at a health care facility   . Allergy   . Substance abuse    Past Surgical History  Procedure Laterality Date  . Left shoulder  1990  . Colonoscopy     Social History:   reports that he has been smoking Cigarettes.  He has a 40 pack-year smoking history. He has never used  smokeless tobacco. He reports that he drinks about 10.8 ounces of alcohol per week. He reports that he uses illicit drugs (Marijuana) about 5 times per week.  Family History  Problem Relation Age of Onset  . Cancer Mother   . Stroke Sister   . Breast cancer Sister   . Colitis Neg Hx   . Esophageal cancer Neg Hx   . Stomach cancer Neg Hx   . Rectal cancer Neg Hx     Medications: Patient's Medications  New Prescriptions   No medications on file  Previous Medications   GABAPENTIN (NEURONTIN) 100 MG CAPSULE    Take 1 capsule (100 mg total) by mouth 3 (three) times daily.  Modified Medications   No medications on file  Discontinued Medications   TDAP (BOOSTRIX) 5-2.5-18.5 LF-MCG/0.5 INJECTION    Inject 0.5 mLs into the muscle once.     Physical Exam:  Filed Vitals:   06/03/13 1052  BP: 126/80  Pulse: 84  Temp: 97.5 F (36.4 C)  TempSrc: Oral  Weight: 127 lb (57.607 kg)  SpO2: 99%    Physical Exam  Constitutional: He is oriented to person, place, and time and well-developed, well-nourished, and in no distress.  HENT:  Head: Normocephalic and atraumatic.  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 Neck supple. No thyromegaly present.  Pain to paraspinal muscles on bilateral sides  of cervical spine  Cardiovascular: Normal rate, regular rhythm and normal heart sounds.   Pulmonary/Chest: Effort normal and breath sounds normal. No respiratory distress.  Abdominal: Soft. Bowel sounds are normal. He exhibits no distension. There is no tenderness.  Musculoskeletal: Normal range of motion. He exhibits no edema and no tenderness.  Lymphadenopathy:    He has no cervical adenopathy.  Neurological: He is alert and oriented to person, place, and time. He has normal strength. He displays normal reflexes. A sensory deficit (decrease sensation to right hand and fingers) is present. No cranial nerve deficit. Gait normal. Coordination and gait normal.  Skin: Skin is  warm and dry.     Labs reviewed: Basic Metabolic Panel:  Recent Labs  05/17/13 1204  NA 140  K 3.9  CL 104  CO2 19  GLUCOSE 77  BUN 13  CREATININE 0.99  CALCIUM 9.7   Liver Function Tests:  Recent Labs  05/17/13 1204  AST 33  ALT 29  ALKPHOS 77  BILITOT 0.9  PROT 7.5   No results found for this basename: LIPASE, AMYLASE,  in the last 8760 hours No results found for this basename: AMMONIA,  in the last 8760 hours CBC:  Recent Labs  05/17/13 1204  WBC 7.4  NEUTROABS 4.2  HGB 13.8  HCT 40.7  MCV 99*   CERVICAL SPINE 4+ VIEWS  COMPARISON: None.  FINDINGS:  Diffuse degenerative disc disease changes with disc space narrowing  and spurring. Findings most pronounced from C3-4 through C5-6.  Uncovertebral spurring and facet disease causes mild to moderate  bilateral neural foraminal narrowing at C3-4 and C4-5. Prevertebral  soft tissues are normal. No fracture or subluxation.  IMPRESSION:  Degenerative disc and facet disease. Mild bilateral neural foraminal  narrowing at C3-4 and C4-5.    Assessment/Plan 1. Cervical spondylosis with myelopathy -pt has attempted NSAID which was unsuccessful in helping pain or changes in hand strength, gabapentin has helped with the pain; with diminished sensation to right hand - will cont this will get  Ambulatory referral to Neurosurgery for further evaluation and treatment -pt to follow up after neurosurgery referral -given return precautions

## 2013-08-16 ENCOUNTER — Ambulatory Visit: Payer: Medicare Other | Admitting: Internal Medicine

## 2013-08-16 DIAGNOSIS — Z0289 Encounter for other administrative examinations: Secondary | ICD-10-CM

## 2013-10-04 ENCOUNTER — Encounter: Payer: Self-pay | Admitting: Internal Medicine

## 2013-10-04 ENCOUNTER — Ambulatory Visit (INDEPENDENT_AMBULATORY_CARE_PROVIDER_SITE_OTHER): Payer: Medicare Other | Admitting: Internal Medicine

## 2013-10-04 ENCOUNTER — Ambulatory Visit
Admission: RE | Admit: 2013-10-04 | Discharge: 2013-10-04 | Disposition: A | Discharge status: Home / Self Care | Payer: Medicare Other | Source: Ambulatory Visit | Attending: Internal Medicine | Admitting: Internal Medicine

## 2013-10-04 ENCOUNTER — Telehealth: Payer: Self-pay | Admitting: *Deleted

## 2013-10-04 ENCOUNTER — Other Ambulatory Visit: Payer: Self-pay | Admitting: *Deleted

## 2013-10-04 VITALS — BP 142/72 | HR 71 | Temp 97.5°F | Resp 18 | Ht 65.0 in | Wt 122.6 lb

## 2013-10-04 DIAGNOSIS — F17200 Nicotine dependence, unspecified, uncomplicated: Secondary | ICD-10-CM

## 2013-10-04 DIAGNOSIS — R0989 Other specified symptoms and signs involving the circulatory and respiratory systems: Secondary | ICD-10-CM

## 2013-10-04 DIAGNOSIS — G992 Myelopathy in diseases classified elsewhere: Secondary | ICD-10-CM

## 2013-10-04 DIAGNOSIS — R252 Cramp and spasm: Secondary | ICD-10-CM | POA: Insufficient documentation

## 2013-10-04 DIAGNOSIS — R0609 Other forms of dyspnea: Secondary | ICD-10-CM

## 2013-10-04 DIAGNOSIS — B182 Chronic viral hepatitis C: Secondary | ICD-10-CM | POA: Insufficient documentation

## 2013-10-04 DIAGNOSIS — R0600 Dyspnea, unspecified: Secondary | ICD-10-CM | POA: Insufficient documentation

## 2013-10-04 DIAGNOSIS — R634 Abnormal weight loss: Secondary | ICD-10-CM

## 2013-10-04 DIAGNOSIS — R911 Solitary pulmonary nodule: Secondary | ICD-10-CM

## 2013-10-04 DIAGNOSIS — F17210 Nicotine dependence, cigarettes, uncomplicated: Secondary | ICD-10-CM

## 2013-10-04 DIAGNOSIS — M4712 Other spondylosis with myelopathy, cervical region: Secondary | ICD-10-CM

## 2013-10-04 DIAGNOSIS — M4802 Spinal stenosis, cervical region: Secondary | ICD-10-CM

## 2013-10-04 DIAGNOSIS — Z13220 Encounter for screening for lipoid disorders: Secondary | ICD-10-CM

## 2013-10-04 NOTE — Telephone Encounter (Signed)
Call patient regarding chest x-ray, Dr. Mariea Clonts requested that he receive a repeat x-ray with nipple markers. Patient was not at home when phone call was made and unable to leave a message. Sr RMA

## 2013-10-04 NOTE — Progress Notes (Signed)
Patient ID: Ryan Ortega, male   DOB: 03-10-1946, 68 y.o.   MRN: [id number]   Location:  [facility] / [facility]  Code Status: full code  No Known Allergies  Chief Complaint  Patient presents with  . Follow-up    HPI: Patient is a 68 y.o. black male seen in the office today for medical mgt of chronic diseases.  No significant change in numbness of his right hand.  Still dropping items.  Gabapentin is helping with the numbness of his hand.    No new concerns today.    Discussed smoking cessation--has quit in the past.  Was cold [place] in the past.  Smokes 1/2ppd.  All of his friends smoke, as well.  Sometimes doesn't have an appetite. Has been losing weight gradually over the past couple of years upon review of the vitals flowsheet.  Not any more active than before.  Always has some dyspnea on exertion.  No change.  Does have chronic cough.  Nonproductive.  No wheezing or tightness of chest that he notices.  No numbness or tingling of feet.  Denies feeling that they are cold.  Does catch a cramp in them every once in a while.  Happen at night when laying down.  Does sometimes get pain in the backs of his legs when walking.    Review of Systems:  Review of Systems  Constitutional: Positive for weight loss. Negative for fever, chills and malaise/fatigue.  HENT: Negative for congestion.   Respiratory: Positive for cough and shortness of breath. Negative for hemoptysis, sputum production and wheezing.   Cardiovascular: Negative for chest pain, palpitations and leg swelling.  Gastrointestinal: Negative for abdominal pain, constipation, blood in stool and melena.  Musculoskeletal: Negative for falls.       Leg cramps  Skin: Negative for rash.  Neurological: Negative for dizziness, loss of consciousness and headaches.  Psychiatric/Behavioral: Negative for memory loss.     Past Medical History  Diagnosis Date  . Personal history of colonic adenoma 06/25/2012    . Chronic hepatitis C without mention of hepatic coma   . Pain in joint, forearm   . Lipoma of unspecified site   . Mild cognitive impairment, so stated   . Unspecified visual loss   . Tobacco use disorder   . Tinnitus of both ears   . Hearing loss of aging   . Internal hemorrhoids without mention of complication   . Osteoarthrosis, unspecified whether generalized or localized, unspecified site   . Rash and other nonspecific skin eruption   . Loss of weight   . Routine general medical examination at a health care facility   . Allergy   . Substance abuse     Past Surgical History  Procedure Laterality Date  . Left shoulder  1990  . Colonoscopy      Social History:   reports that he has been smoking Cigarettes.  He has a 40 pack-year smoking history. He has never used smokeless tobacco. He reports that he drinks about 10.8 ounces of alcohol per week. He reports that he uses illicit drugs (Marijuana) about 5 times per week.  Family History  Problem Relation Age of Onset  . Cancer Mother   . Stroke Sister   . Breast cancer Sister   . Colitis Neg Hx   . Esophageal cancer Neg Hx   . Stomach cancer Neg Hx   . Rectal cancer Neg Hx     Medications: Patient's Medications  New  Prescriptions   No medications on file  Previous Medications   GABAPENTIN (NEURONTIN) 100 MG CAPSULE    Take 1 capsule (100 mg total) by mouth 3 (three) times daily.  Modified Medications   No medications on file  Discontinued Medications   No medications on file     Physical Exam: Filed Vitals:   10/04/13 0832  BP: 142/72  Pulse: 71  Temp: 97.5 F (36.4 C)  TempSrc: Oral  Resp: 18  Height: 5\' 5"  (1.651 m)  Weight: 122 lb 9.6 oz (55.611 kg)  SpO2: 98%  Physical Exam  Constitutional: He is oriented to person, place, and time.  Increasingly frail black male  Cardiovascular:  Regular with frequent skipped beats  Pulmonary/Chest: Effort normal and breath sounds normal. No respiratory  distress.  Abdominal: Soft. Bowel sounds are normal. He exhibits no distension and no mass. There is no tenderness.  Musculoskeletal: Normal range of motion.  Loss of thenar musculature, numbness and weakness of right hand  Neurological: He is alert and oriented to person, place, and time.  Skin: Skin is warm and dry.  Psychiatric:  Laissez-faire affect    Labs reviewed: Basic Metabolic Panel:  Recent Labs  05/17/13 1204  NA 140  K 3.9  CL 104  CO2 19  GLUCOSE 77  BUN 13  CREATININE 0.99  CALCIUM 9.7   Liver Function Tests:  Recent Labs  05/17/13 1204  AST 33  ALT 29  ALKPHOS 77  BILITOT 0.9  PROT 7.5  CBC:  Recent Labs  05/17/13 1204  WBC 7.4  NEUTROABS 4.2  HGB 13.8  HCT 40.7  MCV 99*   Assessment/Plan 1. Loss of weight - precipitous for the past few years upon review of flowsheet -pt admits (only when asked), that this also worries him -will start with CXR to evaluate due to rhonchi and emphysematous appearance  - DG Chest 2 View; Future -may need CT chest, PFTs in the future, but he does not always follow through with tests due to cost 2. Stenosis of cervical spine with myelopathy -persists, has seen Dr. Christella Noa and had MRI of his neck done -I received Dr. Lacy Duverney note before the MRI but not the MRI or f/u note--pt says he was not seen after the MRI 3. Smoking 1/2 pack a day or less - DG Chest 2 View; Future -says he would like to quit, but all friends smoke -will work on this more in the future 4. Dyspnea on exertion -says this has not progressed, but is still present so in combination with weight loss and smoking, will check CXR - DG Chest 2 View; Future 5. Bilateral leg cramps -will need ABIs done in the near future, but he has trouble following up on appts and affording testing  6. Chronic hepatitis C without hepatic coma -discussed that there are new treatments available for this, and I may refer him in the future for this at least to  determine if this might be affordable for him 7. Screening, lipid -not done anytime recently so will check - Lipid panel  Labs/tests ordered:   Orders Placed This Encounter  Procedures  . DG Chest 2 View    Standing Status: Future     Number of Occurrences:      Standing Expiration Date: 12/05/2014    Order Specific Question:  Reason for Exam (SYMPTOM  OR DIAGNOSIS REQUIRED)    Answer:  dyspnea on exertion, weight loss, smoker    Order Specific Question:  Preferred  imaging location?    Answer:  [facility]    Order Specific Question:  Call Report- Best Contact Number?    Answer:  [phone]  . Lipid panel    Order Specific Question:  Has the patient fasted?    Answer:  Yes    Next appt:  6 wks re: sob, weight loss, leg cramps

## 2013-10-04 NOTE — Telephone Encounter (Signed)
Message copied by RICE, SHARON L on Mon Oct 04, 2013  1:28 PM ------      Message from: [place], [place] L      Created: Mon Oct 04, 2013 12:35 PM       There is question whether there is a nodule in his lung or if it is a shadow of his nipple.  They want to repeat the xray with the nipple markers. ------

## 2013-10-05 ENCOUNTER — Telehealth: Payer: Self-pay

## 2013-10-05 ENCOUNTER — Encounter: Payer: Self-pay | Admitting: *Deleted

## 2013-10-05 LAB — LIPID PANEL
Chol/HDL Ratio: 1.9 ratio units (ref 0.0–5.0)
Cholesterol, Total: 136 mg/dL (ref 100–199)
HDL: 72 mg/dL (ref >39)
LDL Calculated: 39 mg/dL (ref 0–99)
Triglycerides: 124 mg/dL (ref 0–149)
VLDL Cholesterol Cal: 25 mg/dL (ref 5–40)

## 2013-10-05 NOTE — Telephone Encounter (Signed)
Wife [name] called, Dr. Mariea Clonts had received MRI and note from Dr. Christella Noa. Needs to f/u with Dr. Christella Noa due to spinal stenosis on MRI. Needs to return to [place] for repeat chest x-ray, something was seen on x-ray and need to repeat it. Gave Mrs. Bills the phone number of Dr. Christella Noa to call to make appt. Appt at [place] is walk in.

## 2013-10-06 ENCOUNTER — Ambulatory Visit
Admission: RE | Admit: 2013-10-06 | Discharge: 2013-10-06 | Disposition: A | Discharge status: Home / Self Care | Payer: Medicare Other | Source: Ambulatory Visit | Attending: Internal Medicine | Admitting: Internal Medicine

## 2013-10-06 ENCOUNTER — Other Ambulatory Visit: Payer: Self-pay | Admitting: Internal Medicine

## 2013-10-06 ENCOUNTER — Telehealth: Payer: Self-pay | Admitting: *Deleted

## 2013-10-06 DIAGNOSIS — R911 Solitary pulmonary nodule: Secondary | ICD-10-CM

## 2013-10-06 NOTE — Telephone Encounter (Signed)
Called patient to give him chest x-ray report, no answer.

## 2013-10-06 NOTE — Telephone Encounter (Signed)
Message copied by RICE, SHARON L on Wed Oct 06, 2013  5:00 PM ------      Message from: [place], [place] L      Created: Wed Oct 06, 2013  1:57 PM       Nothing bad on xray--it was his nipple that looked like a nodule. ------

## 2013-11-18 ENCOUNTER — Ambulatory Visit (INDEPENDENT_AMBULATORY_CARE_PROVIDER_SITE_OTHER): Payer: Medicare Other | Admitting: Internal Medicine

## 2013-11-18 ENCOUNTER — Encounter: Payer: Self-pay | Admitting: Internal Medicine

## 2013-11-18 VITALS — BP 120/60 | HR 76 | Temp 98.1°F | Resp 18 | Ht 65.0 in | Wt 130.6 lb

## 2013-11-18 DIAGNOSIS — B182 Chronic viral hepatitis C: Secondary | ICD-10-CM

## 2013-11-18 DIAGNOSIS — M4712 Other spondylosis with myelopathy, cervical region: Secondary | ICD-10-CM

## 2013-11-18 DIAGNOSIS — R634 Abnormal weight loss: Secondary | ICD-10-CM

## 2013-11-18 DIAGNOSIS — G992 Myelopathy in diseases classified elsewhere: Secondary | ICD-10-CM

## 2013-11-18 DIAGNOSIS — F17200 Nicotine dependence, unspecified, uncomplicated: Secondary | ICD-10-CM

## 2013-11-18 DIAGNOSIS — F17210 Nicotine dependence, cigarettes, uncomplicated: Secondary | ICD-10-CM

## 2013-11-18 DIAGNOSIS — M47812 Spondylosis without myelopathy or radiculopathy, cervical region: Secondary | ICD-10-CM

## 2013-11-18 DIAGNOSIS — M4802 Spinal stenosis, cervical region: Secondary | ICD-10-CM

## 2013-11-18 MED ORDER — GABAPENTIN 300 MG PO CAPS: 300.0000 mg | ORAL_CAPSULE | Freq: Three times a day (TID) | ORAL | Status: DC

## 2013-11-18 NOTE — Progress Notes (Signed)
Patient ID: Ryan Ortega, male   DOB: 1945/12/10, 68 y.o.   MRN: [id number]   Location:  [facility] / [place] [facility]  Code Status: full code  No Known Allergies  Chief Complaint  Patient presents with  . Medical Management of Chronic Issues    HPI: Patient is a 68 y.o.  seen in the office today for  Has gained weight now.  CXR was negative.   Has not been back to Dr. Christella Noa after his MRI.  Has to pay copay and that has prevented him from going.   Hand weakness, dropping things and numbness is about the same on the right.  Pain same in his neck also.  Gabapentin has helped some.   Leg cramps continue now and then. Smokes 1/2ppd.   Not short of breath except when exercises.   Reviewed cholesterol results with him which were excellent.    Review of Systems:  Review of Systems  Constitutional: Negative for fever.  HENT: Negative for congestion.   Eyes: Negative for blurred vision.  Respiratory: Negative for shortness of breath.   Cardiovascular: Negative for chest pain.  Gastrointestinal: Negative for abdominal pain and constipation.  Genitourinary: Negative for dysuria.  Musculoskeletal: Positive for joint pain, myalgias and neck pain. Negative for falls.  Skin: Negative for rash.  Neurological: Negative for dizziness and headaches.  Psychiatric/Behavioral: Negative for memory loss.     Past Medical History  Diagnosis Date  . Personal history of colonic adenoma 06/25/2012  . Chronic hepatitis C without mention of hepatic coma   . Pain in joint, forearm   . Lipoma of unspecified site   . Mild cognitive impairment, so stated   . Unspecified visual loss   . Tobacco use disorder   . Tinnitus of both ears   . Hearing loss of aging   . Internal hemorrhoids without mention of complication   . Osteoarthrosis, unspecified whether generalized or localized, unspecified site   . Rash and other nonspecific skin eruption   . Loss of weight   . Routine  general medical examination at a health care facility   . Allergy   . Substance abuse     Past Surgical History  Procedure Laterality Date  . Left shoulder  1990  . Colonoscopy      Social History:   reports that he has been smoking Cigarettes.  He has a 40 pack-year smoking history. He has never used smokeless tobacco. He reports that he drinks about 10.8 ounces of alcohol per week. He reports that he uses illicit drugs (Marijuana) about 5 times per week.  Family History  Problem Relation Age of Onset  . Cancer Mother   . Stroke Sister   . Breast cancer Sister   . Colitis Neg Hx   . Esophageal cancer Neg Hx   . Stomach cancer Neg Hx   . Rectal cancer Neg Hx     Medications: Patient's Medications  New Prescriptions   No medications on file  Previous Medications   GABAPENTIN (NEURONTIN) 100 MG CAPSULE    Take 1 capsule (100 mg total) by mouth 3 (three) times daily.  Modified Medications   No medications on file  Discontinued Medications   No medications on file     Physical Exam: Filed Vitals:   11/18/13 0838  BP: 120/60  Pulse: 76  Temp: 98.1 F (36.7 C)  TempSrc: Oral  Resp: 18  Height: 5\' 5"  (1.651 m)  Weight: 130 lb 9.6 oz (  59.24 kg)  SpO2: 94%  Physical Exam  Constitutional: He is oriented to person, place, and time.  Thin black male  Cardiovascular: Normal rate, regular rhythm, normal heart sounds and intact distal pulses.   Pulmonary/Chest:  Coarse rhonchi  Abdominal: Soft. Bowel sounds are normal. He exhibits no distension.  Musculoskeletal: Normal range of motion.  Neurological: He is alert and oriented to person, place, and time.  Skin: Skin is warm and dry.  Psychiatric: He has a normal mood and affect.    Labs reviewed: Basic Metabolic Panel:  Recent Labs  05/17/13 1204  NA 140  K 3.9  CL 104  CO2 19  GLUCOSE 77  BUN 13  CREATININE 0.99  CALCIUM 9.7   Liver Function Tests:  Recent Labs  05/17/13 1204  AST 33  ALT 29    ALKPHOS 77  BILITOT 0.9  PROT 7.5   No results found for this basename: LIPASE, AMYLASE,  in the last 8760 hours No results found for this basename: AMMONIA,  in the last 8760 hours CBC:  Recent Labs  05/17/13 1204  WBC 7.4  NEUTROABS 4.2  HGB 13.8  HCT 40.7  MCV 99*   Lipid Panel:  Recent Labs  10/04/13 0910  HDL 72  LDLCALC 39  TRIG 124  CHOLHDL 1.9   Assessment/Plan 1. Loss of weight -has now gained weight since last appt  2. Stenosis of cervical spine with myelopathy -persists, had MRI at Dr Lacy Duverney, but has not followed up for financial reasons  3. Smoking 1/2 pack a day or less -unable to quit and not willing to try any new approaches right now  4. Chronic hepatitis C without hepatic coma -cannot afford treatments at this time--will plan to refer when meds become more affordable  5. Spondylosis, cervical, with myelopathy -will try to increase gabapentin to 300 tid from 100 tid b/c of persistent symptoms -warned about potential side effects at higher doses--to call us if he becomes unsteady - gabapentin (NEURONTIN) 300 MG capsule; Take 1 capsule (300 mg total) by mouth 3 (three) times daily. For neck pain and numbness of right arm  Dispense: 90 capsule; Refill: 3  6. Cervical osteoarthritis - as above - gabapentin (NEURONTIN) 300 MG capsule; Take 1 capsule (300 mg total) by mouth 3 (three) times daily. For neck pain and numbness of right arm  Dispense: 90 capsule; Refill: 3    Labs/tests ordered:  F/u with Dr. Christella Noa  Next appt:  3 mos

## 2014-02-24 ENCOUNTER — Ambulatory Visit: Payer: Medicare Other | Admitting: Internal Medicine

## 2014-03-24 ENCOUNTER — Ambulatory Visit (INDEPENDENT_AMBULATORY_CARE_PROVIDER_SITE_OTHER): Payer: Medicare Other | Admitting: Internal Medicine

## 2014-03-24 ENCOUNTER — Ambulatory Visit (INDEPENDENT_AMBULATORY_CARE_PROVIDER_SITE_OTHER): Payer: Medicare Other | Admitting: *Deleted

## 2014-03-24 ENCOUNTER — Encounter: Payer: Self-pay | Admitting: Internal Medicine

## 2014-03-24 VITALS — BP 122/78 | HR 78 | Temp 98.2°F | Resp 18 | Ht 65.0 in | Wt 133.4 lb

## 2014-03-24 DIAGNOSIS — M4712 Other spondylosis with myelopathy, cervical region: Secondary | ICD-10-CM

## 2014-03-24 DIAGNOSIS — B182 Chronic viral hepatitis C: Secondary | ICD-10-CM

## 2014-03-24 DIAGNOSIS — G992 Myelopathy in diseases classified elsewhere: Secondary | ICD-10-CM

## 2014-03-24 DIAGNOSIS — R350 Frequency of micturition: Secondary | ICD-10-CM

## 2014-03-24 DIAGNOSIS — M4802 Spinal stenosis, cervical region: Secondary | ICD-10-CM

## 2014-03-24 DIAGNOSIS — K4090 Unilateral inguinal hernia, without obstruction or gangrene, not specified as recurrent: Secondary | ICD-10-CM

## 2014-03-24 DIAGNOSIS — Z23 Encounter for immunization: Secondary | ICD-10-CM

## 2014-03-24 DIAGNOSIS — F17210 Nicotine dependence, cigarettes, uncomplicated: Secondary | ICD-10-CM

## 2014-03-24 NOTE — Progress Notes (Signed)
Patient ID: Ryan Ortega, male   DOB: [birth year], 68 y.o.   MRN: [id number]   Location:  [facility] / [place] [facility]  Code Status: full code  No Known Allergies  Chief Complaint  Patient presents with  . Medical Management of Chronic Issues    HPI: Patient is a 68 y.o. black male seen in the office today for f/u of chronic conditions.    Gabapentin increased dose did help his arm pain.  Still has not returned to Ryan Ortega.    Having to go to the bathroom 6-8 times per night.  Discussed not drinking 2 hours before bedtime.    Has a little knot coming in his right groin.  Gets worse when stands.  Not painful.  Started this week.  About 3 weeks ago, picked up something heavy, and did hurt his back.  Comes and goes.    Review of Systems:  Review of Systems  Constitutional: Negative for fever and chills.  HENT: Negative for congestion.   Eyes: Negative for blurred vision.  Respiratory: Positive for cough and shortness of breath.   Cardiovascular: Negative for chest pain.  Gastrointestinal: Negative for abdominal pain.  Genitourinary: Positive for frequency. Negative for dysuria, urgency and hematuria.       Right groin lump  Musculoskeletal: Positive for myalgias. Negative for falls.  Skin: Negative for rash.  Neurological: Positive for dizziness, sensory change and focal weakness. Negative for headaches.       Arms  Psychiatric/Behavioral: Positive for memory loss. Negative for depression.     Past Medical History  Diagnosis Date  . Personal history of colonic adenoma 06/25/2012  . Chronic hepatitis C without mention of hepatic coma   . Pain in joint, forearm   . Lipoma of unspecified site   . Mild cognitive impairment, so stated   . Unspecified visual loss   . Tobacco use disorder   . Tinnitus of both ears   . Hearing loss of aging   . Internal hemorrhoids without mention of complication   . Osteoarthrosis, unspecified whether generalized or  localized, unspecified site   . Rash and other nonspecific skin eruption   . Loss of weight   . Routine general medical examination at a health care facility   . Allergy   . Substance abuse     Past Surgical History  Procedure Laterality Date  . Left shoulder  1990  . Colonoscopy      Social History:   reports that he has been smoking Cigarettes.  He has a 40 pack-year smoking history. He has never used smokeless tobacco. He reports that he drinks about 10.8 oz of alcohol per week. He reports that he uses illicit drugs (Marijuana) about 5 times per week.  Family History  Problem Relation Age of Onset  . Cancer Mother   . Stroke Sister   . Breast cancer Sister   . Colitis Neg Hx   . Esophageal cancer Neg Hx   . Stomach cancer Neg Hx   . Rectal cancer Neg Hx     Medications: Patient's Medications  New Prescriptions   No medications on file  Previous Medications   GABAPENTIN (NEURONTIN) 300 MG CAPSULE    Take 1 capsule (300 mg total) by mouth 3 (three) times daily. For neck pain and numbness of right arm  Modified Medications   No medications on file  Discontinued Medications   No medications on file     Physical Exam: Filed Vitals:  03/24/14 0840  BP: 122/78  Pulse: 78  Temp: 98.2 F (36.8 C)  TempSrc: Oral  Resp: 18  Height: 5\' 5"  (1.651 m)  Weight: 133 lb 6.4 oz (60.51 kg)  SpO2: 99%  Physical Exam  Constitutional: He is oriented to person, place, and time.  Cachectic appearing black male  HENT:  Head: Normocephalic and atraumatic.  Cardiovascular: Normal rate, regular rhythm and normal heart sounds.   Pulmonary/Chest: Effort normal and breath sounds normal.  Abdominal: Soft. Bowel sounds are normal. He exhibits no distension and no mass. There is no tenderness.  Genitourinary:  Right inguinal hernia, nontender, reducible  Musculoskeletal: Normal range of motion.  Loss of thenar eminence bilaterally, tenderness of paravertebral muscles  Neurological:  He is alert and oriented to person, place, and time.  Skin: Skin is warm and dry.    Labs reviewed: Basic Metabolic Panel:  Recent Labs  05/17/13 1204  NA 140  K 3.9  CL 104  CO2 19  GLUCOSE 77  BUN 13  CREATININE 0.99  CALCIUM 9.7   Liver Function Tests:  Recent Labs  05/17/13 1204  AST 33  ALT 29  ALKPHOS 77  BILITOT 0.9  PROT 7.5   No results for input(s): LIPASE, AMYLASE in the last 8760 hours. No results for input(s): AMMONIA in the last 8760 hours. CBC:  Recent Labs  05/17/13 1204  WBC 7.4  NEUTROABS 4.2  HGB 13.8  HCT 40.7  MCV 99*   Lipid Panel:  Recent Labs  10/04/13 0910  HDL 72  LDLCALC 39  TRIG 124  CHOLHDL 1.9   Assessment/Plan 1. Right inguinal hernia -watchful waiting, given info and warning signs of strangulation -currently has no pain whatsoever -advised not to do any heavy lifting  2. Stenosis of cervical spine with myelopathy -gradually progressing -pain is helped with increased dose of gabapentin  3. Smoking 1/2 pack a day or less -1/3 ppd -has gradually cut back but not ready to quit  4. Chronic hepatitis C without hepatic coma -refuses referral due to cost of treatment and copays - CBC With differential/Platelet - Basic metabolic panel - Hepatic function panel  5. Need for prophylactic vaccination and inoculation against influenza -flu shot given today  6. Urinary frequency - PSA today due to this and some difficulty starting stream  Labs/tests ordered:   Orders Placed This Encounter  Procedures  . CBC With differential/Platelet  . Basic metabolic panel  . Hepatic function panel  . PSA    Next appt:  3 mos  Ryan Ortega, D.O. [facility] [address], [place] [address] Cell Phone (Mon-Fri 8am-5pm):  [phone] On Call:  [phone] & follow prompts after 5pm & weekends Office Phone:  [phone] Office Fax:  [phone]

## 2014-03-24 NOTE — Patient Instructions (Signed)

## 2014-03-25 LAB — PSA: PSA: 0.8 ng/mL (ref 0.0–4.0)

## 2014-03-25 LAB — HEPATIC FUNCTION PANEL
ALT: 63 IU/L — ABNORMAL HIGH (ref 0–44)
AST: 66 IU/L — ABNORMAL HIGH (ref 0–40)
Albumin: 4.2 g/dL (ref 3.6–4.8)
Alkaline Phosphatase: 68 IU/L (ref 39–117)
Bilirubin, Direct: 0.38 mg/dL (ref 0.00–0.40)
Total Bilirubin: 1.3 mg/dL — ABNORMAL HIGH (ref 0.0–1.2)
Total Protein: 7.5 g/dL (ref 6.0–8.5)

## 2014-03-25 LAB — CBC WITH DIFFERENTIAL
Basophils Absolute: 0 10*3/uL (ref 0.0–0.2)
Basos: 1 %
Eos: 2 %
Eosinophils Absolute: 0.1 10*3/uL (ref 0.0–0.4)
HCT: 39.9 % (ref 37.5–51.0)
Hemoglobin: 13.6 g/dL (ref 12.6–17.7)
Immature Grans (Abs): 0 10*3/uL (ref 0.0–0.1)
Immature Granulocytes: 0 %
Lymphocytes Absolute: 1.8 10*3/uL (ref 0.7–3.1)
Lymphs: 38 %
MCH: 33.7 pg — ABNORMAL HIGH (ref 26.6–33.0)
MCHC: 34.1 g/dL (ref 31.5–35.7)
MCV: 99 fL — ABNORMAL HIGH (ref 79–97)
Monocytes Absolute: 0.4 10*3/uL (ref 0.1–0.9)
Monocytes: 8 %
Neutrophils Absolute: 2.5 10*3/uL (ref 1.4–7.0)
Neutrophils Relative %: 51 %
Platelets: 201 10*3/uL (ref 150–379)
RBC: 4.03 x10E6/uL — ABNORMAL LOW (ref 4.14–5.80)
RDW: 13.2 % (ref 12.3–15.4)
WBC: 4.8 10*3/uL (ref 3.4–10.8)

## 2014-03-25 LAB — BASIC METABOLIC PANEL
BUN/Creatinine Ratio: 17 (ref 10–22)
BUN: 14 mg/dL (ref 8–27)
CO2: 23 mmol/L (ref 18–29)
Calcium: 9.5 mg/dL (ref 8.6–10.2)
Chloride: 106 mmol/L (ref 97–108)
Creatinine, Ser: 0.81 mg/dL (ref 0.76–1.27)
GFR calc Af Amer: 105 mL/min/{1.73_m2} (ref >59)
GFR calc non Af Amer: 91 mL/min/{1.73_m2} (ref >59)
Glucose: 103 mg/dL — ABNORMAL HIGH (ref 65–99)
Potassium: 4.6 mmol/L (ref 3.5–5.2)
Sodium: 141 mmol/L (ref 134–144)

## 2014-04-13 ENCOUNTER — Other Ambulatory Visit: Payer: Self-pay | Admitting: Internal Medicine

## 2014-05-11 ENCOUNTER — Other Ambulatory Visit: Payer: Self-pay | Admitting: Internal Medicine

## 2014-05-11 ENCOUNTER — Other Ambulatory Visit: Payer: Self-pay | Admitting: *Deleted

## 2014-05-11 MED ORDER — GABAPENTIN 300 MG PO CAPS: ORAL_CAPSULE | ORAL | Status: DC

## 2014-05-11 NOTE — Telephone Encounter (Signed)
Patient requested to be faxed to pharmacy 

## 2014-06-24 ENCOUNTER — Ambulatory Visit (INDEPENDENT_AMBULATORY_CARE_PROVIDER_SITE_OTHER): Payer: Medicare Other | Admitting: Internal Medicine

## 2014-06-24 ENCOUNTER — Encounter: Payer: Self-pay | Admitting: Internal Medicine

## 2014-06-24 VITALS — BP 128/76 | HR 72 | Temp 97.7°F | Resp 20 | Ht 65.0 in | Wt 130.4 lb

## 2014-06-24 DIAGNOSIS — Z23 Encounter for immunization: Secondary | ICD-10-CM | POA: Diagnosis not present

## 2014-06-24 DIAGNOSIS — B182 Chronic viral hepatitis C: Secondary | ICD-10-CM | POA: Diagnosis not present

## 2014-06-24 DIAGNOSIS — M4712 Other spondylosis with myelopathy, cervical region: Secondary | ICD-10-CM

## 2014-06-24 DIAGNOSIS — G992 Myelopathy in diseases classified elsewhere: Secondary | ICD-10-CM

## 2014-06-24 DIAGNOSIS — F17210 Nicotine dependence, cigarettes, uncomplicated: Secondary | ICD-10-CM

## 2014-06-24 DIAGNOSIS — K4090 Unilateral inguinal hernia, without obstruction or gangrene, not specified as recurrent: Secondary | ICD-10-CM

## 2014-06-24 DIAGNOSIS — M4802 Spinal stenosis, cervical region: Principal | ICD-10-CM

## 2014-06-24 MED ORDER — GABAPENTIN 300 MG PO CAPS: ORAL_CAPSULE | ORAL | Status: DC

## 2014-06-24 NOTE — Progress Notes (Signed)
Patient ID: Ryan Ortega, male   DOB: 1945/12/16, 69 y.o.   MRN: [id number]   Location:  [facility] / [place] [facility]  Code Status: full code  No Known Allergies  Chief Complaint  Patient presents with  . Medical Management of Chronic Issues    3 month follow up    HPI: Patient is a 69 y.o. black male seen in the office today for med mgt of chronic diseases.    Hernia remains reducible and not painful Right hand numbness worsening.   Drinking 4 beers per day; still using marijuana, but does not use any other illicit drugs.  Drinks his beer with meals.   Discussed cutting back on alcohol  Review of Systems:  Review of Systems  Constitutional: Negative for fever and chills.  HENT: Negative for congestion.   Respiratory: Negative for shortness of breath.   Cardiovascular: Negative for chest pain.  Gastrointestinal: Negative for abdominal pain and constipation.  Genitourinary: Positive for frequency. Negative for dysuria.  Musculoskeletal: Positive for myalgias and neck pain. Negative for falls.  Skin: Negative for rash.  Psychiatric/Behavioral: Negative for memory loss.     Past Medical History  Diagnosis Date  . Personal history of colonic adenoma 06/25/2012  . Chronic hepatitis C without mention of hepatic coma   . Pain in joint, forearm   . Lipoma of unspecified site   . Mild cognitive impairment, so stated   . Unspecified visual loss   . Tobacco use disorder   . Tinnitus of both ears   . Hearing loss of aging   . Internal hemorrhoids without mention of complication   . Osteoarthrosis, unspecified whether generalized or localized, unspecified site   . Rash and other nonspecific skin eruption   . Loss of weight   . Routine general medical examination at a health care facility   . Allergy   . Substance abuse     Past Surgical History  Procedure Laterality Date  . Left shoulder  1990  . Colonoscopy      Social History:   reports that  he has been smoking Cigarettes.  He has a 40 pack-year smoking history. He has never used smokeless tobacco. He reports that he drinks about 10.8 oz of alcohol per week. He reports that he uses illicit drugs (Marijuana) about 5 times per week.  Family History  Problem Relation Age of Onset  . Cancer Mother   . Stroke Sister   . Breast cancer Sister   . Colitis Neg Hx   . Esophageal cancer Neg Hx   . Stomach cancer Neg Hx   . Rectal cancer Neg Hx     Medications: Patient's Medications  New Prescriptions   No medications on file  Previous Medications   GABAPENTIN (NEURONTIN) 300 MG CAPSULE    Take one capsule by mouth three times daily for neck pain and numbness of right arm  Modified Medications   No medications on file  Discontinued Medications   No medications on file     Physical Exam: Filed Vitals:   06/24/14 0938  BP: 128/76  Pulse: 72  Temp: 97.7 F (36.5 C)  TempSrc: Oral  Resp: 20  Height: 5\' 5"  (1.651 m)  Weight: 130 lb 6.4 oz (59.149 kg)  SpO2: 98%  Physical Exam  Constitutional: He is oriented to person, place, and time.  Thin black male  Cardiovascular: Normal rate, regular rhythm and normal heart sounds.   Pulmonary/Chest: Effort normal and breath sounds  normal.  Musculoskeletal: He exhibits tenderness.  Of left trapezius muscle   Neurological: He is alert and oriented to person, place, and time.     Labs reviewed: Basic Metabolic Panel:  Recent Labs  03/24/14 0910  NA 141  K 4.6  CL 106  CO2 23  GLUCOSE 103*  BUN 14  CREATININE 0.81  CALCIUM 9.5   Liver Function Tests:  Recent Labs  03/24/14 0910  AST 66*  ALT 63*  ALKPHOS 68  BILITOT 1.3*  PROT 7.5   No results for input(s): LIPASE, AMYLASE in the last 8760 hours. No results for input(s): AMMONIA in the last 8760 hours. CBC:  Recent Labs  03/24/14 0910  WBC 4.8  NEUTROABS 2.5  HGB 13.6  HCT 39.9  MCV 99*  PLT 201   Lipid Panel:  Recent Labs  10/04/13 0910    CHOL 136  HDL 72  LDLCALC 39  TRIG 124  CHOLHDL 1.9    Assessment/Plan 1. Stenosis of cervical spine with myelopathy - is progressing, he has not been able to afford going back to neurosurgery or any reimaging -increase the gabapentin to max dose--if still not getting relief, would go to lyrica--avoid narcotics due to his alcohol and drug use history - gabapentin (NEURONTIN) 300 MG capsule; Take two capsules by mouth three times daily for neck pain and numbness of right arm  Dispense: 180 capsule; Refill: 3  2. Right inguinal hernia -remains reducible -pt has been educated to go to ED if he develops severe pain or it cannot be reduced -avoid heavy lifting  3. Smoking 1/2 pack a day or less -continues, also uses marijuana and alcohol--advised to cut back on alcohol with his hepatitis especially  4. Chronic hepatitis C without hepatic coma - referred to health department for vaccinations against other types of hepatitis and for hep c treatment--does have insurance now - Comprehensive metabolic panel  5. Need for vaccination with 13-polyvalent pneumococcal conjugate vaccine -prevnar given  Labs/tests ordered:   Orders Placed This Encounter  Procedures  . Comprehensive metabolic panel    Order Specific Question:  Has the patient fasted?    Answer:  Yes   Next appt:  3 mos   Ryan Ortega, D.O. [facility] [address], [place] [address] Cell Phone (Mon-Fri 8am-5pm):  [phone] On Call:  [phone] & follow prompts after 5pm & weekends Office Phone:  [phone] Office Fax:  [phone]

## 2014-06-24 NOTE — Patient Instructions (Addendum)
Health Department for hepatitis C treatment and vaccinations against other types of hepatitis. [place], [place]  Please cut back your drinking to 1 beer per day.    Let's increase your gabapentin to 600mg  by mouth three times daily.

## 2014-06-25 LAB — COMPREHENSIVE METABOLIC PANEL
ALT: 47 IU/L — ABNORMAL HIGH (ref 0–44)
AST: 53 IU/L — ABNORMAL HIGH (ref 0–40)
Albumin/Globulin Ratio: 1.2 (ref 1.1–2.5)
Albumin: 4 g/dL (ref 3.6–4.8)
Alkaline Phosphatase: 75 IU/L (ref 39–117)
BUN/Creatinine Ratio: 14 (ref 10–22)
BUN: 10 mg/dL (ref 8–27)
Bilirubin Total: 0.9 mg/dL (ref 0.0–1.2)
CO2: 20 mmol/L (ref 18–29)
Calcium: 9.1 mg/dL (ref 8.6–10.2)
Chloride: 103 mmol/L (ref 97–108)
Creatinine, Ser: 0.69 mg/dL — ABNORMAL LOW (ref 0.76–1.27)
GFR calc Af Amer: 112 mL/min/{1.73_m2} (ref >59)
GFR calc non Af Amer: 97 mL/min/{1.73_m2} (ref >59)
Globulin, Total: 3.3 g/dL (ref 1.5–4.5)
Glucose: 95 mg/dL (ref 65–99)
Potassium: 4.3 mmol/L (ref 3.5–5.2)
Sodium: 141 mmol/L (ref 134–144)
Total Protein: 7.3 g/dL (ref 6.0–8.5)

## 2014-06-27 ENCOUNTER — Encounter: Payer: Self-pay | Admitting: Internal Medicine

## 2014-09-15 ENCOUNTER — Encounter: Payer: Self-pay | Admitting: Internal Medicine

## 2014-09-23 ENCOUNTER — Ambulatory Visit (INDEPENDENT_AMBULATORY_CARE_PROVIDER_SITE_OTHER): Payer: Medicare Other | Admitting: Internal Medicine

## 2014-09-23 ENCOUNTER — Encounter: Payer: Self-pay | Admitting: Internal Medicine

## 2014-09-23 VITALS — BP 132/66 | HR 79 | Temp 98.3°F | Resp 18 | Ht 65.0 in | Wt 122.0 lb

## 2014-09-23 DIAGNOSIS — M542 Cervicalgia: Secondary | ICD-10-CM | POA: Diagnosis not present

## 2014-09-23 DIAGNOSIS — B182 Chronic viral hepatitis C: Secondary | ICD-10-CM

## 2014-09-23 DIAGNOSIS — M4802 Spinal stenosis, cervical region: Principal | ICD-10-CM

## 2014-09-23 DIAGNOSIS — M4712 Other spondylosis with myelopathy, cervical region: Secondary | ICD-10-CM

## 2014-09-23 DIAGNOSIS — J069 Acute upper respiratory infection, unspecified: Secondary | ICD-10-CM

## 2014-09-23 DIAGNOSIS — Z720 Tobacco use: Secondary | ICD-10-CM | POA: Diagnosis not present

## 2014-09-23 DIAGNOSIS — G8929 Other chronic pain: Secondary | ICD-10-CM | POA: Diagnosis not present

## 2014-09-23 DIAGNOSIS — R634 Abnormal weight loss: Secondary | ICD-10-CM | POA: Diagnosis not present

## 2014-09-23 DIAGNOSIS — G992 Myelopathy in diseases classified elsewhere: Secondary | ICD-10-CM

## 2014-09-23 NOTE — Progress Notes (Signed)
Patient ID: Ryan Ortega, male   DOB: [birth year], 69 y.o.   MRN: [id number]   Location:  [facility] / [place] [facility]  Code Status: full code Goals of Care: Advanced Directive information Does patient have an advance directive?: No, Would patient like information on creating an advanced directive?: Yes - Educational materials given (previously, but unclear if he has good understanding )   Chief Complaint  Patient presents with  . Medical Management of Chronic Issues    light head cold    HPI: Patient is a 69 y.o. black male seen in the office today for med mgt chronic diseases and acute for a "light head cold".  He's not coughing, nasal drainage improved.  He was using otc treatment.  Has had this for 1-2 wks.  Has been using a cough syrup--robitussin.    He had gained some weight, but has lost again.  I am concerned with his smoking history, weight loss that he may have lung cancer.  We discussed this.  He agrees to a screening CT scan to evaluate for this.  Says he's probably been eating less.  Has cut back on his drinking to "2 40s per day."  His neck/arm pain and weakness persists.  His neck imaging has shown severe arthritis with some myelopathy, but he does not want surgery done and says he'll live with the pain and taking the gabapentin.    He went to get his hep A and B shots due to his hep C diagnosis.  He brought me the records from the [facility].  He has one more vaccine later this month.    Review of Systems:  Review of Systems  Constitutional: Positive for weight loss. Negative for fever, chills and malaise/fatigue.  HENT: Negative for congestion and hearing loss.   Eyes: Negative for blurred vision.  Respiratory: Positive for cough, sputum production and wheezing. Negative for hemoptysis and shortness of breath.   Cardiovascular: Negative for chest pain and leg swelling.  Gastrointestinal: Negative for abdominal pain.    Genitourinary: Negative for dysuria, urgency and frequency.  Musculoskeletal: Positive for myalgias and neck pain. Negative for falls.  Skin: Negative for rash.  Neurological: Positive for sensory change and focal weakness. Negative for dizziness, weakness and headaches.  Psychiatric/Behavioral: Negative for depression and memory loss.    Past Medical History  Diagnosis Date  . Personal history of colonic adenoma 06/25/2012  . Chronic hepatitis C without mention of hepatic coma   . Pain in joint, forearm   . Lipoma of unspecified site   . Mild cognitive impairment, so stated   . Unspecified visual loss   . Tobacco use disorder   . Tinnitus of both ears   . Hearing loss of aging   . Internal hemorrhoids without mention of complication   . Osteoarthrosis, unspecified whether generalized or localized, unspecified site   . Rash and other nonspecific skin eruption   . Loss of weight   . Routine general medical examination at a health care facility   . Allergy   . Substance abuse     Past Surgical History  Procedure Laterality Date  . Left shoulder  1990  . Colonoscopy      No Known Allergies Medications: Patient's Medications  New Prescriptions   No medications on file  Previous Medications   GABAPENTIN (NEURONTIN) 300 MG CAPSULE    Take two capsules by mouth three times daily for neck pain and numbness of  right arm  Modified Medications   No medications on file  Discontinued Medications   No medications on file    Physical Exam: Filed Vitals:   09/23/14 1000  BP: 132/66  Pulse: 79  Temp: 98.3 F (36.8 C)  TempSrc: Oral  Resp: 18  Height: 5\' 5"  (1.651 m)  Weight: 122 lb (55.339 kg)  SpO2: 97%   Physical Exam  Constitutional: He is oriented to person, place, and time.  Cachectic black male, ambulates independently  Cardiovascular: Normal rate, regular rhythm, normal heart sounds and intact distal pulses.   Pulmonary/Chest: Effort normal.  Coarse wet rhonchi  and expiratory wheezes  Abdominal: Soft. Bowel sounds are normal. He exhibits no distension. There is no tenderness.  Musculoskeletal: Normal range of motion. He exhibits tenderness.  Neck, some decrease in thenar eminences, decreased right hand grip  Neurological: He is alert and oriented to person, place, and time.  Skin: Skin is warm and dry.    Labs reviewed: Basic Metabolic Panel:  Recent Labs  03/24/14 0910 06/24/14 1052  NA 141 141  K 4.6 4.3  CL 106 103  CO2 23 20  GLUCOSE 103* 95  BUN 14 10  CREATININE 0.81 0.69*  CALCIUM 9.5 9.1   Liver Function Tests:  Recent Labs  03/24/14 0910 06/24/14 1052  AST 66* 53*  ALT 63* 47*  ALKPHOS 68 75  BILITOT 1.3* 0.9  PROT 7.5 7.3   No results for input(s): LIPASE, AMYLASE in the last 8760 hours. No results for input(s): AMMONIA in the last 8760 hours. CBC:  Recent Labs  03/24/14 0910  WBC 4.8  NEUTROABS 2.5  HGB 13.6  HCT 39.9  MCV 99*  PLT 201   Lipid Panel:  Recent Labs  10/04/13 0910  CHOL 136  HDL 72  LDLCALC 39  TRIG 124  CHOLHDL 1.9   Procedures since last visit: Hep A and B vaccines  Assessment/Plan 1. Stenosis of cervical spine with myelopathy -no change he says -cont gabapentin 600mg  po tid  2. Loss of weight - I am concerned he could have lung cancer considering his longstanding smoking, weight loss, cough - CT Chest Wo Contrast; Future  3. Tobacco abuse - is ongoing, also smokes marijuana - CT Chest Wo Contrast; Future  4. Chronic neck pain -has cervical OA with myelopathy - CT Chest Wo Contrast; Future  5. Acute upper respiratory infection - seems this is resolving, but has some acute bronchitis it seems probably acute on chronic, but has never had a pulmonary assessment/PFTs - CT Chest Wo Contrast; Future  Labs/tests ordered:   Orders Placed This Encounter  Procedures  . CT Chest Wo Contrast    WT-122/PREV CXR IN EPIC/NO NEEDS PER OFFICE/EPIC ORDER/[facility],DOROTHY  [phone] [facility]    Standing Status: Future     Number of Occurrences:      Standing Expiration Date: 11/23/2015    Order Specific Question:  Reason for Exam (SYMPTOM  OR DIAGNOSIS REQUIRED)    Answer:  weight loss, cough, longstanding smoker    Order Specific Question:  Preferred imaging location?    Answer:  [id number]    Next appt:  1 month to review CT results and will check liver panel also  Aylssa Herrig L. Deyon Chizek, D.O. [facility] [address], [place] [address] Cell Phone (Mon-Fri 8am-5pm):  [phone] On Call:  [phone] & follow prompts after 5pm & weekends Office Phone:  [phone] Office Fax:  [phone]       

## 2014-09-29 ENCOUNTER — Ambulatory Visit
Admission: RE | Admit: 2014-09-29 | Discharge: 2014-09-29 | Disposition: A | Discharge status: Home / Self Care | Payer: Medicare Other | Source: Ambulatory Visit | Attending: Internal Medicine | Admitting: Internal Medicine

## 2014-09-29 DIAGNOSIS — M542 Cervicalgia: Secondary | ICD-10-CM

## 2014-09-29 DIAGNOSIS — Z720 Tobacco use: Secondary | ICD-10-CM

## 2014-09-29 DIAGNOSIS — R634 Abnormal weight loss: Secondary | ICD-10-CM

## 2014-09-29 DIAGNOSIS — R918 Other nonspecific abnormal finding of lung field: Secondary | ICD-10-CM | POA: Diagnosis not present

## 2014-09-29 DIAGNOSIS — J069 Acute upper respiratory infection, unspecified: Secondary | ICD-10-CM

## 2014-09-29 DIAGNOSIS — G8929 Other chronic pain: Secondary | ICD-10-CM

## 2014-10-10 DIAGNOSIS — Z23 Encounter for immunization: Secondary | ICD-10-CM | POA: Diagnosis not present

## 2014-11-04 ENCOUNTER — Encounter: Payer: Self-pay | Admitting: Internal Medicine

## 2014-11-04 ENCOUNTER — Ambulatory Visit (INDEPENDENT_AMBULATORY_CARE_PROVIDER_SITE_OTHER): Payer: Medicare Other | Admitting: Internal Medicine

## 2014-11-04 VITALS — BP 108/68 | HR 70 | Temp 97.8°F | Resp 20 | Ht 65.0 in | Wt 120.4 lb

## 2014-11-04 DIAGNOSIS — Z720 Tobacco use: Secondary | ICD-10-CM

## 2014-11-04 DIAGNOSIS — M19011 Primary osteoarthritis, right shoulder: Secondary | ICD-10-CM

## 2014-11-04 DIAGNOSIS — R918 Other nonspecific abnormal finding of lung field: Secondary | ICD-10-CM | POA: Diagnosis not present

## 2014-11-04 DIAGNOSIS — F1010 Alcohol abuse, uncomplicated: Secondary | ICD-10-CM

## 2014-11-04 DIAGNOSIS — R634 Abnormal weight loss: Secondary | ICD-10-CM

## 2014-11-04 DIAGNOSIS — B182 Chronic viral hepatitis C: Secondary | ICD-10-CM | POA: Diagnosis not present

## 2014-11-04 NOTE — Progress Notes (Signed)
Patient ID: Ryan Ortega, male   DOB: [birth year], 69 y.o.   MRN: [id number]   Location:  [facility]  Code Status: full code Goals of Care: Advanced Directive information Does patient have an advance directive?: No, Would patient like information on creating an advanced directive?: Yes - Educational materials given   Chief Complaint  Patient presents with  . Medical Management of Chronic Issues    1 month follow-up    HPI: Patient is a 69 y.o. black male seen in the office today for medical mgt of his chronic diseases.  Pulmonary nodules:  Ct chest reviewed with pt done for screening due to weight loss and cough.    Tobacco abuse--says he quit 3-4 wks ago.    Has one more vaccine scheduled at the health dept for hepatitis a/b due to his hep c.    Down 1.5lbs more.  Eating well.  No abdominal or chest pains.    Still drinking beer.  2 40s in a day.    Right hand still feels like needs in it and keeps getting worse.  Can hardly hold anything in his hand.  CT chest also showed severe arthritis in the right shoulder.  He says "hell no" to surgery on his right shoulder even if it helped the tingling and numbness in his hand.    Review of Systems:  Review of Systems  Constitutional: Positive for weight loss. Negative for fever, chills and malaise/fatigue.  HENT: Negative for hearing loss.   Eyes: Negative for blurred vision.  Respiratory: Positive for cough. Negative for hemoptysis, sputum production and shortness of breath.   Cardiovascular: Negative for chest pain.  Gastrointestinal: Negative for abdominal pain and constipation.  Genitourinary: Negative for dysuria and urgency.  Musculoskeletal: Positive for joint pain. Negative for falls.  Neurological: Positive for tingling and sensory change. Negative for weakness.       Right hand with weakness  Endo/Heme/Allergies: Does not bruise/bleed easily.  Psychiatric/Behavioral: Negative  for memory loss.    Past Medical History  Diagnosis Date  . Personal history of colonic adenoma 06/25/2012  . Chronic hepatitis C without mention of hepatic coma   . Pain in joint, forearm   . Lipoma of unspecified site   . Mild cognitive impairment, so stated   . Unspecified visual loss   . Tobacco use disorder   . Tinnitus of both ears   . Hearing loss of aging   . Internal hemorrhoids without mention of complication   . Osteoarthrosis, unspecified whether generalized or localized, unspecified site   . Rash and other nonspecific skin eruption   . Loss of weight   . Routine general medical examination at a health care facility   . Allergy   . Substance abuse     Past Surgical History  Procedure Laterality Date  . Left shoulder  1990  . Colonoscopy      No Known Allergies Medications: Patient's Medications  New Prescriptions   No medications on file  Previous Medications   GABAPENTIN (NEURONTIN) 300 MG CAPSULE    Take two capsules by mouth three times daily for neck pain and numbness of right arm  Modified Medications   No medications on file  Discontinued Medications   No medications on file    Physical Exam: Filed Vitals:   11/04/14 1021  BP: 108/68  Pulse: 70  Temp: 97.8 F (36.6 C)  TempSrc: Oral  Resp: 20  Height: 5\' 5"  (  1.651 m)  Weight: 120 lb 6.4 oz (54.613 kg)  SpO2: 97%   Physical Exam  Constitutional: He is oriented to person, place, and time.  Cachectic appearing black male  HENT:  Head: Normocephalic and atraumatic.  Cardiovascular: Normal rate, regular rhythm, normal heart sounds and intact distal pulses.   Pulmonary/Chest: Effort normal and breath sounds normal. No respiratory distress.  Few rhonchi  Abdominal: Soft. Bowel sounds are normal.  Musculoskeletal:  Ambulates independently  Neurological: He is alert and oriented to person, place, and time.  Does not make good eye contact; abrupt responses  Skin: Skin is warm and dry.     Labs reviewed: Basic Metabolic Panel:  Recent Labs  03/24/14 0910 06/24/14 1052  NA 141 141  K 4.6 4.3  CL 106 103  CO2 23 20  GLUCOSE 103* 95  BUN 14 10  CREATININE 0.81 0.69*  CALCIUM 9.5 9.1   Liver Function Tests:  Recent Labs  03/24/14 0910 06/24/14 1052  AST 66* 53*  ALT 63* 47*  ALKPHOS 68 75  BILITOT 1.3* 0.9  PROT 7.5 7.3   No results for input(s): LIPASE, AMYLASE in the last 8760 hours. No results for input(s): AMMONIA in the last 8760 hours. CBC:  Recent Labs  03/24/14 0910  WBC 4.8  NEUTROABS 2.5  HGB 13.6  HCT 39.9  MCV 99*  PLT 201   Lipid Panel: No results for input(s): CHOL, HDL, LDLCALC, TRIG, CHOLHDL, LDLDIRECT in the last 8760 hours. No results found for: HGBA1C  Procedures since last visit: 09/29/14:  CT chest w/o contrast:  Several 2-3 mm nodular opacities as described. Followup of these nodular opacity should be based on Fleischner Society guidelines. If the patient is at high risk for bronchogenic carcinoma, follow-up chest CT at 1 year is recommended. If the patient is at low risk, no follow-up is needed. This recommendation follows the consensus statement: Guidelines for Management of Small Pulmonary Nodules Detected on CT Scans: A Statement from the [employer] as published in Radiology 2005; 237:395-400. There are no larger pulmonary nodular lesions. No edema or consolidation. No adenopathy. Multiple foci of atherosclerotic calcification. There are foci of coronary artery calcification present. ADDENDUM: There is extensive degenerative change in the right shoulder. There is postoperative change in the left shoulder region.  Assessment/Plan 1. Pulmonary nodules/lesions, multiple -pt says his cough is gone, but he did cough some while waiting -has quit smoking now for 3-4 wks  -based on several nodules and high risk for bronchogenic ca, CT in 1 year recommended and ordered- CT Chest Wo Contrast; Future -weight still  down another 1.5 lbs since last visit -if continues to lose weight and cough, will refer to pulmonary for further workup  2. Tobacco abuse -says he quit 3-4 weeks ago (has several times and restarts) -wait and see if he sticks with it--was encouraged and congratulated  3. Loss of weight -ongoing -suspect due to lung ca -intake is good he says  4. Chronic hepatitis C without hepatic coma -complete full vaccine series for Hep A and B at health dept--has one remaining vaccine--says he has an appt for it  5. Primary osteoarthritis of right shoulder -noted on his CT chest incidentally, but has had progressively worsening weakness and numbness of the fingers of his right hand -he refuses orthopedic referral b/c he does not want surgery  6. Alcohol abuse - ongoing problem--is down to 2 40s per day of beer -has had difficulty with chronic pancreatitis historically -recommended  continuing to cut back  Labs/tests ordered:   Orders Placed This Encounter  Procedures  . CT Chest Wo Contrast    Standing Status: Future     Number of Occurrences:      Standing Expiration Date: 01/05/2016    Order Specific Question:  Reason for Exam (SYMPTOM  OR DIAGNOSIS REQUIRED)    Answer:  cough, pulmonary nodule 1 year f/u    Order Specific Question:  Preferred imaging location?    Answer:  [facility]    Next appt:  4 mos.  Shyanna Klingel L. Edan Juday, D.O. [facility] [address], [place] [address] Cell Phone (Mon-Fri 8am-5pm):  [phone] On Call:  [phone] & follow prompts after 5pm & weekends Office Phone:  [phone] Office Fax:  [phone]

## 2014-11-09 ENCOUNTER — Telehealth: Payer: Self-pay | Admitting: Internal Medicine

## 2014-11-09 NOTE — Telephone Encounter (Signed)
Dr. Mariea Clonts ordered a CT CHEST WO CONTRAST on 11/04/14 for patient for 1 year out. Sent staff message to Dr. Mariea Clonts informing her that referrals expire out of the queue after 3 months. Dr. Mariea Clonts inquired about scheduling this appointment now for a year from now but the insurance company will not pre-cert an order that far in advance. Advised Dr. Mariea Clonts in staff message response that, that may happen.

## 2015-03-17 ENCOUNTER — Encounter: Payer: Self-pay | Admitting: Internal Medicine

## 2015-03-17 ENCOUNTER — Ambulatory Visit (INDEPENDENT_AMBULATORY_CARE_PROVIDER_SITE_OTHER): Payer: Medicare Other | Admitting: Internal Medicine

## 2015-03-17 VITALS — BP 140/82 | HR 68 | Temp 97.6°F | Wt 128.0 lb

## 2015-03-17 DIAGNOSIS — G992 Myelopathy in diseases classified elsewhere: Secondary | ICD-10-CM

## 2015-03-17 DIAGNOSIS — R634 Abnormal weight loss: Secondary | ICD-10-CM

## 2015-03-17 DIAGNOSIS — Z23 Encounter for immunization: Secondary | ICD-10-CM | POA: Diagnosis not present

## 2015-03-17 DIAGNOSIS — Z720 Tobacco use: Secondary | ICD-10-CM

## 2015-03-17 DIAGNOSIS — M4712 Other spondylosis with myelopathy, cervical region: Secondary | ICD-10-CM | POA: Diagnosis not present

## 2015-03-17 DIAGNOSIS — R918 Other nonspecific abnormal finding of lung field: Secondary | ICD-10-CM

## 2015-03-17 DIAGNOSIS — F1010 Alcohol abuse, uncomplicated: Secondary | ICD-10-CM

## 2015-03-17 DIAGNOSIS — M4802 Spinal stenosis, cervical region: Secondary | ICD-10-CM

## 2015-03-17 NOTE — Progress Notes (Signed)
Patient ID: Ryan Ortega, male   DOB: April 14, 1946, 69 y.o.   MRN: [id number]   Location: [facility] Provider: Rexene Edison. Mariea Clonts, D.O., C.M.D.  Goals of Care: Advanced Directive information Does patient have an advance directive?: No, Would patient like information on creating an advanced directive?: Yes - Educational materials given (at a previous visit )  Chief Complaint  Patient presents with  . Medical Management of Chronic Issues    4 month check pulmonary nodules, loss of weight    HPI: Patient is a 69 y.o. male seen in the office today for medical mgt of chronic diseases especially f/u on pulmonary nodules and weight loss.  He has gained 8 lbs.    Denies coughing.  Smoking still:  Still 1pack per week.    Numbness in hand worse on right hand.  Has been out of his gabapentin for 3 weeks.  Discussed referral for surgery in the past, but he did not want to go due to cost and not wanting surgery.    Goes Monday at 9:30 for his last hep a/b series at health dept due to his hep C.    Eyes are very watery.  Says he ordered glasses, but hasn't gotten them yet.  No pain, redness, just watering.  Uses drops at home for dry eyes.    Alcohol:  One 40 per day.  No liquor.    Review of Systems:  Review of Systems  Constitutional: Negative for fever, chills and malaise/fatigue.       Up 8 lbs now today (wearing several layers of clothes, too)  HENT: Negative for congestion.   Eyes: Positive for blurred vision. Negative for photophobia, pain and redness.       Eyes watering--says they're dry and uses drops at home  Respiratory: Positive for sputum production. Negative for cough and shortness of breath.        Denies cough, but coughing  Cardiovascular: Negative for chest pain, palpitations and leg swelling.  Gastrointestinal: Negative for abdominal pain.  Genitourinary: Negative for dysuria.  Musculoskeletal: Negative for falls.  Skin: Negative for rash.  Neurological: Positive  for tingling. Negative for weakness and headaches.       Right hand ulnar aspect  Psychiatric/Behavioral: Negative for depression and memory loss.    Past Medical History  Diagnosis Date  . Personal history of colonic adenoma 06/25/2012  . Chronic hepatitis C without mention of hepatic coma   . Pain in joint, forearm   . Lipoma of unspecified site   . Mild cognitive impairment, so stated   . Unspecified visual loss   . Tobacco use disorder   . Tinnitus of both ears   . Hearing loss of aging   . Internal hemorrhoids without mention of complication   . Osteoarthrosis, unspecified whether generalized or localized, unspecified site   . Rash and other nonspecific skin eruption   . Loss of weight   . Routine general medical examination at a health care facility   . Allergy   . Substance abuse     Past Surgical History  Procedure Laterality Date  . Left shoulder  1990  . Colonoscopy      No Known Allergies    Medication List       This list is accurate as of: 03/17/15 11:03 AM.  Always use your most recent med list.               gabapentin 300 MG capsule  Commonly known  as:  NEURONTIN  Take two capsules by mouth three times daily for neck pain and numbness of right arm        Health Maintenance  Topic Date Due  . INFLUENZA VACCINE  11/14/2014  . COLONOSCOPY  08/31/2017  . TETANUS/TDAP  05/18/2023  . ZOSTAVAX  Completed  . Hepatitis C Screening  Completed  . PNA vac Low Risk Adult  Completed    Physical Exam: Filed Vitals:   03/17/15 1037  BP: 140/82  Pulse: 68  Temp: 97.6 F (36.4 C)  TempSrc: Oral  Weight: 128 lb (58.06 kg)  SpO2: 99%   Body mass index is 21.3 kg/(m^2). Physical Exam  Constitutional:  Cachectic black male  HENT:  Head: Normocephalic and atraumatic.  Eyes: EOM are normal. Pupils are equal, round, and reactive to light.  Watery eyes  Cardiovascular: Normal rate, regular rhythm and normal heart sounds.   Pulmonary/Chest: Effort  normal.  Coarse wet rhonchi   Abdominal: Bowel sounds are normal.  Musculoskeletal:  Diminished sensation last three fingers of right hand and pain when reaches back to put his coat on  Skin: Skin is warm and dry.  Psychiatric:  Flat affect    Labs reviewed: Basic Metabolic Panel:  Recent Labs  03/24/14 0910 06/24/14 1052  NA 141 141  K 4.6 4.3  CL 106 103  CO2 23 20  GLUCOSE 103* 95  BUN 14 10  CREATININE 0.81 0.69*  CALCIUM 9.5 9.1   Liver Function Tests:  Recent Labs  03/24/14 0910 06/24/14 1052  AST 66* 53*  ALT 63* 47*  ALKPHOS 68 75  BILITOT 1.3* 0.9  PROT 7.5 7.3  ALBUMIN 4.2 4.0   No results for input(s): LIPASE, AMYLASE in the last 8760 hours. No results for input(s): AMMONIA in the last 8760 hours. CBC:  Recent Labs  03/24/14 0910  WBC 4.8  NEUTROABS 2.5  HGB 13.6  HCT 39.9  MCV 99*  PLT 201   Lipid Panel: No results for input(s): CHOL, HDL, LDLCALC, TRIG, CHOLHDL, LDLDIRECT in the last 8760 hours. No results found for: HGBA1C  Procedures since last visit: CT chest with multiple nodules reviewed from 09/29/14--for f/u CT next summer  Assessment/Plan 1. Pulmonary nodules/lesions, multiple -continues to smoke despite awareness that these could be malignant and could be responsible for his cough, weight loss  2. Tobacco abuse -is back to about a pack per week -agrees to try to cut down again (had quit at last visit, but started back)  3. Loss of weight -has now gained 8 lbs which can't all be his clothes -suspicious given his tobacco and alcohol abuse--concerned for malignancy and has pulmonary nodules none large enough for biopsy but needs recheck of ct next summer -still smoking and drinking  4. Alcohol abuse -down to 1 40 a day from 2, he says -counseled that he should continue to cut down, but encouraged for cutting down to that  5. 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 Need for influenza vaccination -flu shot given  Labs/tests ordered:   Orders Placed This Encounter  Procedures  . Flu Vaccine QUAD 36+ mos IM    Next appt:  4 mos for med mgt  Daqwan Dougal L. Pascal Stiggers, D.O. [facility] [address] [place], [place]  [id number] Cell Phone (Mon-Fri 8am-5pm):  [phone] On Call:  [phone] & follow prompts after 5pm & weekends Office Phone:  [phone] Office Fax:  [phone]

## 2015-03-20 DIAGNOSIS — Z23 Encounter for immunization: Secondary | ICD-10-CM | POA: Diagnosis not present

## 2015-03-24 ENCOUNTER — Other Ambulatory Visit: Payer: Self-pay | Admitting: Internal Medicine

## 2015-05-23 DIAGNOSIS — H5201 Hypermetropia, right eye: Secondary | ICD-10-CM | POA: Diagnosis not present

## 2015-05-23 DIAGNOSIS — H2511 Age-related nuclear cataract, right eye: Secondary | ICD-10-CM | POA: Diagnosis not present

## 2015-05-23 DIAGNOSIS — H524 Presbyopia: Secondary | ICD-10-CM | POA: Diagnosis not present

## 2015-05-23 DIAGNOSIS — H52221 Regular astigmatism, right eye: Secondary | ICD-10-CM | POA: Diagnosis not present

## 2015-05-23 DIAGNOSIS — H2702 Aphakia, left eye: Secondary | ICD-10-CM | POA: Diagnosis not present

## 2015-07-20 ENCOUNTER — Ambulatory Visit: Payer: Medicare Other | Admitting: Internal Medicine

## 2015-08-14 ENCOUNTER — Other Ambulatory Visit: Payer: Self-pay | Admitting: Internal Medicine

## 2015-08-17 ENCOUNTER — Ambulatory Visit (INDEPENDENT_AMBULATORY_CARE_PROVIDER_SITE_OTHER): Payer: Medicare Other | Admitting: Internal Medicine

## 2015-08-17 ENCOUNTER — Other Ambulatory Visit: Payer: Self-pay

## 2015-08-17 ENCOUNTER — Encounter: Payer: Self-pay | Admitting: Internal Medicine

## 2015-08-17 VITALS — BP 120/70 | HR 75 | Temp 97.8°F | Ht 65.0 in | Wt 127.0 lb

## 2015-08-17 DIAGNOSIS — B182 Chronic viral hepatitis C: Secondary | ICD-10-CM

## 2015-08-17 DIAGNOSIS — M4712 Other spondylosis with myelopathy, cervical region: Secondary | ICD-10-CM | POA: Diagnosis not present

## 2015-08-17 DIAGNOSIS — R634 Abnormal weight loss: Secondary | ICD-10-CM | POA: Diagnosis not present

## 2015-08-17 DIAGNOSIS — G992 Myelopathy in diseases classified elsewhere: Secondary | ICD-10-CM

## 2015-08-17 DIAGNOSIS — F1010 Alcohol abuse, uncomplicated: Secondary | ICD-10-CM

## 2015-08-17 DIAGNOSIS — R918 Other nonspecific abnormal finding of lung field: Secondary | ICD-10-CM | POA: Diagnosis not present

## 2015-08-17 DIAGNOSIS — Z720 Tobacco use: Secondary | ICD-10-CM | POA: Diagnosis not present

## 2015-08-17 DIAGNOSIS — M4802 Spinal stenosis, cervical region: Secondary | ICD-10-CM

## 2015-08-17 NOTE — Progress Notes (Signed)
Patient ID: Ryan Ortega, male   DOB: 1946-01-05, 70 y.o.   MRN: [id number]   Location:  [facility] clinic Provider:  Shaneequa Bahner L. Mariea Clonts, D.O., C.M.D.  Code Status: full code Goals of Care:  Advanced Directives 08/17/2015  Does patient have an advance directive? No  Would patient like information on creating an advanced directive? No - patient declined information   Chief Complaint  Patient presents with  . Medical Management of Chronic Issues    4 mth follo-up    HPI: Patient is a 70 y.o. male seen today for medical management of chronic diseases.    Reports smoking less:  1pack per 3 days. Alcohol:  1 40 per day.  Weight stable--just down one lb and wearing a lot less clothes this time.  Cervical OA:  Unchanged.  Using gabapentin with some benefit.  Appetite is about the same.    Saw his eye doctor and got a prescription for two new pair of glasses.  Past Medical History  Diagnosis Date  . Personal history of colonic adenoma 06/25/2012  . Chronic hepatitis C without mention of hepatic coma   . Pain in joint, forearm   . Lipoma of unspecified site   . Mild cognitive impairment, so stated   . Unspecified visual loss   . Tobacco use disorder   . Tinnitus of both ears   . Hearing loss of aging   . Internal hemorrhoids without mention of complication   . Osteoarthrosis, unspecified whether generalized or localized, unspecified site   . Rash and other nonspecific skin eruption   . Loss of weight   . Routine general medical examination at a health care facility   . Allergy   . Substance abuse     Past Surgical History  Procedure Laterality Date  . Left shoulder  1990  . Colonoscopy      No Known Allergies    Medication List       This list is accurate as of: 08/17/15  8:56 AM.  Always use your most recent med list.               gabapentin 300 MG capsule  Commonly known as:  NEURONTIN  TAKE TWO CAPSULES BY MOUTH THREE TIMES DAILY FOR NECK PAIN AND NUMBNESS OF RIGHT  ARM        Review of Systems:  Review of Systems  Constitutional: Positive for weight loss. Negative for malaise/fatigue.  HENT: Negative for congestion and hearing loss.   Eyes: Negative for blurred vision.  Respiratory: Positive for cough and sputum production. Negative for shortness of breath and wheezing.   Cardiovascular: Negative for chest pain, palpitations and leg swelling.  Gastrointestinal: Negative for abdominal pain, constipation, blood in stool and melena.  Genitourinary: Negative for dysuria, urgency and frequency.  Musculoskeletal: Positive for myalgias and neck pain. Negative for falls.  Skin: Negative for rash.  Neurological: Positive for tingling and sensory change. Negative for dizziness, loss of consciousness and weakness.  Psychiatric/Behavioral: Negative for depression and memory loss.    Health Maintenance  Topic Date Due  . INFLUENZA VACCINE  11/14/2015  . COLONOSCOPY  08/31/2017  . TETANUS/TDAP  05/18/2023  . ZOSTAVAX  Completed  . Hepatitis C Screening  Completed  . PNA vac Low Risk Adult  Completed    Physical Exam: Filed Vitals:   08/17/15 0826  BP: 120/70  Pulse: 75  Temp: 97.8 F (36.6 C)  TempSrc: Oral  Height: 5\' 5"  (1.651 m)  Weight:  127 lb (57.607 kg)  SpO2: 98%   Body mass index is 21.13 kg/(m^2). Physical Exam  Constitutional: He is oriented to person, place, and time. No distress.  Frail black male  HENT:  Head: Normocephalic and atraumatic.  Eyes:  Wearing glasses  Cardiovascular: Normal rate, regular rhythm, normal heart sounds and intact distal pulses.   Pulmonary/Chest: Effort normal. He has wheezes.  Coarse rhonchi in lower lungs  Musculoskeletal: Normal range of motion.  Loss of thenar eminence bilaterally  Neurological: He is alert and oriented to person, place, and time.  Skin: Skin is warm and dry.  Psychiatric:  anxious    Labs reviewed: Needs another set of labs  CT chest w/o contrast 09/29/14:  Several 2-3  mm nodular opacities as described. Followup of these nodular opacity should be based on Fleischner Society guidelines. If the patient is at high risk for bronchogenic carcinoma, follow-up chest CT at 1 year is recommended. If the patient is at low risk, no follow-up is needed. This recommendation follows the consensus statement: Guidelines for Management of Small Pulmonary Nodules Detected on CT Scans: A Statement from the [place] as published in Radiology 2005; 237:395-400. There are no larger pulmonary nodular lesions. No edema or consolidation. No adenopathy. Multiple foci of atherosclerotic calcification. There are foci of coronary artery calcification present.  Assessment/Plan 1. Multiple pulmonary nodules determined by computed tomography of lung - CT CHEST NODULE FOLLOW UP LOW DOSE W/O; Future - previous was 09/29/14  2. Tobacco abuse - continues to smoke - not ready to quit  3. Alcohol abuse - continues to drink 1 40 per day--counseled on importance of cutting down due to his hepatitis also  4. Loss of weight - stable since last visit - does not eat very well  And smokes - await f/u CT chest in summer to check on nodules  5. Stenosis of cervical spine with myelopathy -cont current gabapentin tid  6. Chronic hepatitis C without hepatic coma (HCC) - emphasized importance of alcohol cessation due to this liver disease -cannot afford treatment by report  Labs/tests ordered:   Orders Placed This Encounter  Procedures  . CT CHEST NODULE FOLLOW UP LOW DOSE W/O    Standing Status: Future     Number of Occurrences:      Standing Expiration Date: 10/16/2016    Order Specific Question:  Reason for Exam (SYMPTOM  OR DIAGNOSIS REQUIRED)    Answer:  multiple pulmonary nodules on 6/16 CT    Order Specific Question:  Preferred imaging location?    Answer:  [facility]  . Lipid panel    Order Specific Question:  Has the patient fasted?    Answer:  Yes  . Basic  metabolic panel    Order Specific Question:  Has the patient fasted?    Answer:  Yes  . Hepatic Function Panel   Next appt:  3 mos med mgt   Jakaiya Netherland L. Florean Hoobler, D.O. [facility] [address], [place] [address] Cell Phone (Mon-Fri 8am-5pm):  [phone] On Call:  [phone] & follow prompts after 5pm & weekends Office Phone:  [phone] Office Fax:  [phone]

## 2015-08-18 ENCOUNTER — Other Ambulatory Visit: Payer: Medicare Other

## 2015-08-18 DIAGNOSIS — B182 Chronic viral hepatitis C: Secondary | ICD-10-CM | POA: Diagnosis not present

## 2015-08-19 LAB — BASIC METABOLIC PANEL
BUN/Creatinine Ratio: 16 (ref 10–24)
BUN: 12 mg/dL (ref 8–27)
CO2: 22 mmol/L (ref 18–29)
Calcium: 9.3 mg/dL (ref 8.6–10.2)
Chloride: 100 mmol/L (ref 96–106)
Creatinine, Ser: 0.76 mg/dL (ref 0.76–1.27)
GFR calc Af Amer: 106 mL/min/{1.73_m2} (ref >59)
GFR calc non Af Amer: 92 mL/min/{1.73_m2} (ref >59)
Glucose: 96 mg/dL (ref 65–99)
Potassium: 4 mmol/L (ref 3.5–5.2)
Sodium: 137 mmol/L (ref 134–144)

## 2015-08-19 LAB — HEPATIC FUNCTION PANEL
ALT: 77 IU/L — ABNORMAL HIGH (ref 0–44)
AST: 68 IU/L — ABNORMAL HIGH (ref 0–40)
Albumin: 4 g/dL (ref 3.5–4.8)
Alkaline Phosphatase: 85 IU/L (ref 39–117)
Bilirubin Total: 0.8 mg/dL (ref 0.0–1.2)
Bilirubin, Direct: 0.32 mg/dL (ref 0.00–0.40)
Total Protein: 7.3 g/dL (ref 6.0–8.5)

## 2015-08-19 LAB — LIPID PANEL
Chol/HDL Ratio: 1.7 ratio units (ref 0.0–5.0)
Cholesterol, Total: 124 mg/dL (ref 100–199)
HDL: 75 mg/dL (ref >39)
LDL Calculated: 38 mg/dL (ref 0–99)
Triglycerides: 56 mg/dL (ref 0–149)
VLDL Cholesterol Cal: 11 mg/dL (ref 5–40)

## 2015-08-21 ENCOUNTER — Encounter: Payer: Self-pay | Admitting: *Deleted

## 2015-08-21 ENCOUNTER — Other Ambulatory Visit: Payer: Self-pay | Admitting: *Deleted

## 2015-08-21 DIAGNOSIS — R918 Other nonspecific abnormal finding of lung field: Secondary | ICD-10-CM

## 2015-09-19 ENCOUNTER — Ambulatory Visit
Admission: RE | Admit: 2015-09-19 | Discharge: 2015-09-19 | Disposition: A | Discharge status: Home / Self Care | Payer: Medicare Other | Source: Ambulatory Visit | Attending: Internal Medicine | Admitting: Internal Medicine

## 2015-09-19 DIAGNOSIS — R918 Other nonspecific abnormal finding of lung field: Secondary | ICD-10-CM | POA: Diagnosis not present

## 2015-12-11 ENCOUNTER — Other Ambulatory Visit: Payer: Self-pay | Admitting: *Deleted

## 2015-12-11 MED ORDER — GABAPENTIN 300 MG PO CAPS: ORAL_CAPSULE | ORAL | 3 refills | Status: DC

## 2015-12-11 NOTE — Telephone Encounter (Signed)
[facility] 

## 2015-12-19 ENCOUNTER — Other Ambulatory Visit: Payer: Self-pay | Admitting: Internal Medicine

## 2015-12-19 ENCOUNTER — Other Ambulatory Visit: Payer: Self-pay

## 2015-12-21 ENCOUNTER — Encounter: Payer: Self-pay | Admitting: Internal Medicine

## 2015-12-21 ENCOUNTER — Ambulatory Visit (INDEPENDENT_AMBULATORY_CARE_PROVIDER_SITE_OTHER): Payer: Medicare Other | Admitting: Internal Medicine

## 2015-12-21 VITALS — BP 120/70 | HR 62 | Temp 97.8°F | Wt 132.0 lb

## 2015-12-21 DIAGNOSIS — G992 Myelopathy in diseases classified elsewhere: Secondary | ICD-10-CM

## 2015-12-21 DIAGNOSIS — R918 Other nonspecific abnormal finding of lung field: Secondary | ICD-10-CM | POA: Diagnosis not present

## 2015-12-21 DIAGNOSIS — B182 Chronic viral hepatitis C: Secondary | ICD-10-CM | POA: Diagnosis not present

## 2015-12-21 DIAGNOSIS — M4802 Spinal stenosis, cervical region: Secondary | ICD-10-CM

## 2015-12-21 DIAGNOSIS — M4712 Other spondylosis with myelopathy, cervical region: Secondary | ICD-10-CM | POA: Diagnosis not present

## 2015-12-21 DIAGNOSIS — Z720 Tobacco use: Secondary | ICD-10-CM

## 2015-12-21 DIAGNOSIS — Z23 Encounter for immunization: Secondary | ICD-10-CM

## 2015-12-21 DIAGNOSIS — G4700 Insomnia, unspecified: Secondary | ICD-10-CM

## 2015-12-21 MED ORDER — MIRTAZAPINE 7.5 MG PO TABS
7.5000 mg | ORAL_TABLET | Freq: Every day | ORAL | 3 refills | Status: DC
Start: 2015-12-21 — End: 2016-01-24

## 2015-12-21 NOTE — Progress Notes (Signed)
Location:  [facility] clinic Provider:  Wen Merced L. Mariea Clonts, D.O., C.M.D.  Code Status: full code Goals of Care:  Advanced Directives 12/21/2015  Does patient have an advance directive? No  Would patient like information on creating an advanced directive? No - patient declined information  Pre-existing out of facility DNR order (yellow form or pink MOST form) -     Chief Complaint  Patient presents with  . Medical Management of Chronic Issues    44mth follow-up    HPI: Patient is a 70 y.o. male seen today for medical management of chronic diseases.    Says his drinking and smoking are the same.  Is smoking less than a 1/2 ppd.  Says he is close to quitting.    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  Is dropping items more often.  Cannot tighten bolts on his car.  Says he "ain't getting no surgery" on his neck.  Is taking gabapentin 600mg  po tid.  Denies sleepiness or unsteadiness.    He stays awake at night and ends up napping in the daytime.  Appears he has actually gained a couple of lbs since last visit (some might be more clothing b/c it's a cool morning).  Past Medical History:  Diagnosis Date  . Allergy   . Chronic hepatitis C without mention of hepatic coma   . Hearing loss of aging   . Internal hemorrhoids without mention of complication   . Lipoma of unspecified site   . Loss of weight   . Mild cognitive impairment, so stated   . Osteoarthrosis, unspecified whether generalized or localized, unspecified site   . Pain in joint, forearm   . Personal history of colonic adenoma 06/25/2012  . Rash and other nonspecific skin eruption   . Routine general medical examination at a health care facility   . Substance abuse   . Tinnitus of both ears   . Tobacco use disorder   . Unspecified visual loss     Past Surgical History:  Procedure Laterality Date  . COLONOSCOPY    . left shoulder  1990    No Known Allergies    Medication List       Accurate as of  12/21/15  8:28 AM. Always use your most recent med list.          gabapentin 300 MG capsule Commonly known as:  NEURONTIN TAKE TWO CAPSULES BY MOUTH THREE TIMES DAILY FOR NECK PAIN AND NUMBNESS OF RIGHT ARM      Review of Systems:  Review of Systems  Constitutional: Positive for malaise/fatigue. Negative for chills and fever.  HENT: Positive for hearing loss. Negative for congestion.   Eyes: Negative for blurred vision.       Glasses  Respiratory: Negative for cough and shortness of breath.   Cardiovascular: Negative for chest pain, palpitations and leg swelling.  Gastrointestinal: Negative for abdominal pain, blood in stool, constipation and melena.  Genitourinary: Negative for dysuria.  Musculoskeletal: Positive for neck pain. Negative for falls.  Skin: Negative for rash.  Neurological: Positive for sensory change and focal weakness. Negative for dizziness, loss of consciousness and weakness.       Right arm  Endo/Heme/Allergies: Does not bruise/bleed easily.  Psychiatric/Behavioral: Negative for depression and memory loss. The patient has insomnia.     Health Maintenance  Topic Date Due  . INFLUENZA VACCINE  11/14/2015  . COLONOSCOPY  08/31/2017  . TETANUS/TDAP  05/18/2023  . ZOSTAVAX  Completed  .  Hepatitis C Screening  Completed  . PNA vac Low Risk Adult  Completed    Physical Exam: Vitals:   12/21/15 0808  BP: 120/70  Pulse: 62  Temp: 97.8 F (36.6 C)  TempSrc: Oral  SpO2: 98%  Weight: 132 lb (59.9 kg)   Body mass index is 21.97 kg/m. Physical Exam  Constitutional: He is oriented to person, place, and time. No distress.  Cardiovascular: Normal rate, regular rhythm, normal heart sounds and intact distal pulses.   Pulmonary/Chest: Effort normal and breath sounds normal. No respiratory distress.  Abdominal: Bowel sounds are normal.  Musculoskeletal: Normal range of motion. He exhibits tenderness.  Muscle mass loss of right greater than left hand; holds right  hand due to pain; neck pain severe  Neurological: He is alert and oriented to person, place, and time.  Skin: Skin is warm and dry.    Labs reviewed: Basic Metabolic Panel:  Recent Labs  08/18/15 0815  NA 137  K 4.0  CL 100  CO2 22  GLUCOSE 96  BUN 12  CREATININE 0.76  CALCIUM 9.3   Liver Function Tests:  Recent Labs  08/18/15 0815  AST 68*  ALT 77*  ALKPHOS 85  BILITOT 0.8  PROT 7.3  ALBUMIN 4.0   No results for input(s): LIPASE, AMYLASE in the last 8760 hours. No results for input(s): AMMONIA in the last 8760 hours. CBC: No results for input(s): WBC, NEUTROABS, HGB, HCT, MCV, PLT in the last 8760 hours. Lipid Panel:  Recent Labs  08/18/15 0815  CHOL 124  HDL 75  LDLCALC 38  TRIG 56  CHOLHDL 1.7   Assessment/Plan 1. Insomnia - new complaint today--will try remeron - mirtazapine (REMERON) 7.5 MG tablet; Take 1 tablet (7.5 mg total) by mouth at bedtime.  Dispense: 30 tablet; Refill: 3  2. Multiple pulmonary nodules determined by computed tomography of lung -f/u was stable, next due in 6/18  3. Stenosis of cervical spine with myelopathy -is progressing with more weakness of right arm over time  4. Need for prophylactic vaccination and inoculation against influenza -flu shot given today  5. Chronic hepatitis C without hepatic coma (HCC) -has refused tx due to cost  6. Tobacco abuse -ongoing, not ready to quit, but says he's getting there due to cost of a pack being $6  Labs/tests ordered:   Orders Placed This Encounter  Procedures  . Flu Vaccine QUAD 36+ mos PF IM (Fluarix & Fluzone Quad PF)    Next appt:  03/22/2016 med mgt   Antoino Westhoff L. Jahi Roza, D.O. [facility] [address], [place] [address] Cell Phone (Mon-Fri 8am-5pm):  [phone] On Call:  [phone] & follow prompts after 5pm & weekends Office Phone:  [phone] Office Fax:  [phone]

## 2016-01-24 ENCOUNTER — Other Ambulatory Visit: Payer: Self-pay

## 2016-01-24 DIAGNOSIS — F5101 Primary insomnia: Secondary | ICD-10-CM

## 2016-01-24 MED ORDER — MIRTAZAPINE 7.5 MG PO TABS: 7.5000 mg | ORAL_TABLET | Freq: Every day | ORAL | 0 refills | Status: DC

## 2016-02-28 ENCOUNTER — Telehealth: Payer: Self-pay | Admitting: Internal Medicine

## 2016-02-28 NOTE — Telephone Encounter (Signed)
left msg asking pt to confirm this AWV appt w/ nurse. VDM (DD) °

## 2016-03-22 ENCOUNTER — Encounter: Payer: Self-pay | Admitting: Internal Medicine

## 2016-03-22 ENCOUNTER — Ambulatory Visit (INDEPENDENT_AMBULATORY_CARE_PROVIDER_SITE_OTHER): Payer: Medicare Other | Admitting: Internal Medicine

## 2016-03-22 VITALS — BP 138/78 | HR 74 | Temp 98.1°F | Ht 65.0 in | Wt 137.0 lb

## 2016-03-22 DIAGNOSIS — Z720 Tobacco use: Secondary | ICD-10-CM | POA: Diagnosis not present

## 2016-03-22 DIAGNOSIS — G992 Myelopathy in diseases classified elsewhere: Secondary | ICD-10-CM

## 2016-03-22 DIAGNOSIS — R634 Abnormal weight loss: Secondary | ICD-10-CM

## 2016-03-22 DIAGNOSIS — F5101 Primary insomnia: Secondary | ICD-10-CM

## 2016-03-22 DIAGNOSIS — M4802 Spinal stenosis, cervical region: Secondary | ICD-10-CM

## 2016-03-22 DIAGNOSIS — M4712 Other spondylosis with myelopathy, cervical region: Secondary | ICD-10-CM | POA: Diagnosis not present

## 2016-03-22 DIAGNOSIS — B182 Chronic viral hepatitis C: Secondary | ICD-10-CM | POA: Diagnosis not present

## 2016-03-22 DIAGNOSIS — R918 Other nonspecific abnormal finding of lung field: Secondary | ICD-10-CM

## 2016-03-22 LAB — CBC WITH DIFFERENTIAL/PLATELET
Basophils Absolute: 65 cells/uL (ref 0–200)
Basophils Relative: 1 %
Eosinophils Absolute: 325 cells/uL (ref 15–500)
Eosinophils Relative: 5 %
HCT: 43.2 % (ref 38.5–50.0)
Hemoglobin: 14.4 g/dL (ref 13.2–17.1)
Lymphocytes Relative: 41 %
Lymphs Abs: 2665 cells/uL (ref 850–3900)
MCH: 33 pg (ref 27.0–33.0)
MCHC: 33.3 g/dL (ref 32.0–36.0)
MCV: 99.1 fL (ref 80.0–100.0)
MPV: 10.1 fL (ref 7.5–12.5)
Monocytes Absolute: 455 cells/uL (ref 200–950)
Monocytes Relative: 7 %
Neutro Abs: 2990 cells/uL (ref 1500–7800)
Neutrophils Relative %: 46 %
Platelets: 272 10*3/uL (ref 140–400)
RBC: 4.36 MIL/uL (ref 4.20–5.80)
RDW: 12.8 % (ref 11.0–15.0)
WBC: 6.5 10*3/uL (ref 3.8–10.8)

## 2016-03-22 LAB — BASIC METABOLIC PANEL
BUN: 8 mg/dL (ref 7–25)
CO2: 24 mmol/L (ref 20–31)
Calcium: 8.7 mg/dL (ref 8.6–10.3)
Chloride: 105 mmol/L (ref 98–110)
Creat: 0.72 mg/dL (ref 0.70–1.18)
Glucose, Bld: 100 mg/dL — ABNORMAL HIGH (ref 65–99)
Potassium: 4.4 mmol/L (ref 3.5–5.3)
Sodium: 137 mmol/L (ref 135–146)

## 2016-03-22 LAB — HEPATIC FUNCTION PANEL
ALT: 40 U/L (ref 9–46)
AST: 41 U/L — ABNORMAL HIGH (ref 10–35)
Albumin: 3.6 g/dL (ref 3.6–5.1)
Alkaline Phosphatase: 103 U/L (ref 40–115)
Bilirubin, Direct: 0.2 mg/dL (ref <=0.2)
Indirect Bilirubin: 0.6 mg/dL (ref 0.2–1.2)
Total Bilirubin: 0.8 mg/dL (ref 0.2–1.2)
Total Protein: 7.4 g/dL (ref 6.1–8.1)

## 2016-03-22 MED ORDER — MIRTAZAPINE 15 MG PO TABS: 15.0000 mg | ORAL_TABLET | Freq: Every day | ORAL | 3 refills | Status: DC

## 2016-03-22 NOTE — Progress Notes (Signed)
Location:  [facility] clinic Provider:  Tasha Jindra L. Mariea Clonts, D.O., C.M.D.  Code Status: full code Goals of Care:  Advanced Directives 12/21/2015  Does Patient Have a Medical Advance Directive? No  Would patient like information on creating a medical advance directive? No - patient declined information  Pre-existing out of facility DNR order (yellow form or pink MOST form) -   Chief Complaint  Patient presents with  . Medical Management of Chronic Issues    35mth follow-up    HPI: Patient is a 70 y.o. male seen today for medical management of chronic diseases.    Tobacco abuse--about the same amount.  Still not ready to quit.  Aware of risks.    He has no concerns.    Right arm dropping, weakness, numbness and tingling are the same.  Continues on the gabapentin.    His weight is up 5 lbs since the last visit.  He is also wearing heavy clothing.    Remeron is not helping him much.  He is taking it nightly and still cannot get to sleep.    Leg cramps are only every now and then, not persistent.  Pulmonary nodules not due for reeval until summer.   Past Medical History:  Diagnosis Date  . Allergy   . Chronic hepatitis C without mention of hepatic coma   . Hearing loss of aging   . Internal hemorrhoids without mention of complication   . Lipoma of unspecified site   . Loss of weight   . Mild cognitive impairment, so stated   . Osteoarthrosis, unspecified whether generalized or localized, unspecified site   . Pain in joint, forearm   . Personal history of colonic adenoma 06/25/2012  . Rash and other nonspecific skin eruption   . Routine general medical examination at a health care facility   . Substance abuse   . Tinnitus of both ears   . Tobacco use disorder   . Unspecified visual loss     Past Surgical History:  Procedure Laterality Date  . COLONOSCOPY    . left shoulder  1990    No Known Allergies    Medication List       Accurate as of 03/22/16  8:24 AM. Always use  your most recent med list.          gabapentin 300 MG capsule Commonly known as:  NEURONTIN TAKE TWO CAPSULES BY MOUTH THREE TIMES DAILY FOR NECK PAIN AND NUMBNESS OF RIGHT ARM   mirtazapine 7.5 MG tablet Commonly known as:  REMERON Take 1 tablet (7.5 mg total) by mouth at bedtime.       Review of Systems:  Review of Systems  Constitutional: Negative for chills, fever and malaise/fatigue.       Has gained some weight with remeron   Respiratory: Positive for cough and wheezing. Negative for sputum production and shortness of breath.   Cardiovascular: Negative for chest pain and palpitations.  Gastrointestinal: Negative for abdominal pain and constipation.  Genitourinary: Negative for dysuria.  Musculoskeletal: Negative for falls.  Neurological: Positive for tingling, sensory change and focal weakness. Negative for weakness.  Psychiatric/Behavioral: Negative for depression and memory loss. The patient has insomnia.     Health Maintenance  Topic Date Due  . COLONOSCOPY  08/31/2017  . TETANUS/TDAP  05/18/2023  . INFLUENZA VACCINE  Completed  . ZOSTAVAX  Completed  . Hepatitis C Screening  Completed  . PNA vac Low Risk Adult  Completed    Physical Exam: Vitals:  03/22/16 0816  BP: 138/78  Pulse: 74  Temp: 98.1 F (36.7 C)  TempSrc: Oral  SpO2: 96%  Weight: 137 lb (62.1 kg)  Height: 5\' 5"  (1.651 m)   Body mass index is 22.8 kg/m. Physical Exam  Constitutional: He is oriented to person, place, and time. No distress.  Cardiovascular: Normal rate, regular rhythm, normal heart sounds and intact distal pulses.   Pulmonary/Chest: Effort normal. He has wheezes.  Musculoskeletal: Normal range of motion.  Loss of interosseous muscle mass right greater than left hand, drops things on right  Neurological: He is alert and oriented to person, place, and time. Coordination normal.  Skin: Skin is warm and dry.  Psychiatric: He has a normal mood and affect.  Flat affect     Labs reviewed: Basic Metabolic Panel:  Recent Labs  08/18/15 0815  NA 137  K 4.0  CL 100  CO2 22  GLUCOSE 96  BUN 12  CREATININE 0.76  CALCIUM 9.3   Liver Function Tests:  Recent Labs  08/18/15 0815  AST 68*  ALT 77*  ALKPHOS 85  BILITOT 0.8  PROT 7.3  ALBUMIN 4.0   No results for input(s): LIPASE, AMYLASE in the last 8760 hours. No results for input(s): AMMONIA in the last 8760 hours. CBC: No results for input(s): WBC, NEUTROABS, HGB, HCT, MCV, PLT in the last 8760 hours. Lipid Panel:  Recent Labs  08/18/15 0815  CHOL 124  HDL 75  LDLCALC 38  TRIG 56  CHOLHDL 1.7   Assessment/Plan 1. Primary insomnia - still not falling asleep with 7.5mg  so increase to 15mg  and see if he does better - mirtazapine (REMERON) 15 MG tablet; Take 1 tablet (15 mg total) by mouth at bedtime.  Dispense: 90 tablet; Refill: 3  2. Tobacco abuse -ongoing, not ready to quit, has cut down over the past few years as I've been counseling him on risks and due to nodules on chest imaging  3. Loss of weight - has gained 5 lbs since last visit with start of remeron 7.5mg  - CBC with Differential/Platelet - Basic metabolic panel - Hepatic Function Panel  4. Pulmonary nodules/lesions, multiple -f/u CT chest in 6/18  5. Stenosis of cervical spine with myelopathy -ongoing, continues on gabapentin for neuropathic pain/radiculopathy -has weakness of right arm and drops items -he is adamantly opposed to surgery and would not even see the neurosurgeon for a consult -suspect leg cramps are related to this, as well  6. Chronic hepatitis C without hepatic coma ([place]) - f/u labs: - Hepatic Function Panel  Labs/tests ordered:   Orders Placed This Encounter  Procedures  . CBC with Differential/Platelet  . Basic metabolic panel  . Hepatic Function Panel    Next appt:  04/12/2016   Pessy Delamar L. Ivanna Kocak, D.O. [facility] [address], [place] [address] Cell Phone (Mon-Fri 8am-5pm):  [phone] On Call:  [phone] & follow prompts after 5pm & weekends Office Phone:  [phone] Office Fax:  [phone]

## 2016-03-25 ENCOUNTER — Encounter: Payer: Self-pay | Admitting: *Deleted

## 2016-04-12 ENCOUNTER — Ambulatory Visit (INDEPENDENT_AMBULATORY_CARE_PROVIDER_SITE_OTHER): Payer: Medicare Other

## 2016-04-12 VITALS — BP 180/100 | HR 74 | Temp 97.3°F | Ht 65.0 in | Wt 141.8 lb

## 2016-04-12 DIAGNOSIS — Z0000 Encounter for general adult medical examination without abnormal findings: Secondary | ICD-10-CM | POA: Diagnosis not present

## 2016-04-12 NOTE — Progress Notes (Signed)
Subjective:   Ryan Ortega is a 70 y.o. male who presents for an Initial Medicare Annual Wellness Visit.  Review of Systems  Cardiac Risk Factors include: advanced age (>36men, >58 women);male gender;hypertension;sedentary lifestyle;smoking/ tobacco exposure;family history of premature cardiovascular disease    Objective:    Today's Vitals   04/12/16 0827  BP: (!) 180/100  Pulse: 74  Temp: 97.3 F (36.3 C)  TempSrc: Oral  SpO2: 99%  Weight: 141 lb 12.8 oz (64.3 kg)  Height: 5\' 5"  (1.651 m)  PainSc: 0-No pain   Body mass index is 23.6 kg/m.  Current Medications (verified) Outpatient Encounter Prescriptions as of 04/12/2016  Medication Sig  . gabapentin (NEURONTIN) 300 MG capsule TAKE TWO CAPSULES BY MOUTH THREE TIMES DAILY FOR NECK PAIN AND NUMBNESS OF RIGHT ARM  . mirtazapine (REMERON) 15 MG tablet Take 1 tablet (15 mg total) by mouth at bedtime.   No facility-administered encounter medications on file as of 04/12/2016.     Allergies (verified) Patient has no known allergies.   History: Past Medical History:  Diagnosis Date  . Allergy   . Chronic hepatitis C without mention of hepatic coma   . Hearing loss of aging   . Internal hemorrhoids without mention of complication   . Lipoma of unspecified site   . Loss of weight   . Mild cognitive impairment, so stated   . Osteoarthrosis, unspecified whether generalized or localized, unspecified site   . Pain in joint, forearm   . Personal history of colonic adenoma 06/25/2012  . Rash and other nonspecific skin eruption   . Routine general medical examination at a health care facility   . Substance abuse   . Tinnitus of both ears   . Tobacco use disorder   . Unspecified visual loss    Past Surgical History:  Procedure Laterality Date  . COLONOSCOPY    . left shoulder  1990   Family History  Problem Relation Age of Onset  . Cancer Mother   . Stroke Sister   . Breast cancer Sister   . Colitis Neg Hx   .  Esophageal cancer Neg Hx   . Stomach cancer Neg Hx   . Rectal cancer Neg Hx    Social History   Occupational History  . Not on file.   Social History Main Topics  . Smoking status: Current Some Day Smoker    Packs/day: 1.00    Years: 40.00    Types: Cigarettes  . Smokeless tobacco: Never Used  . Alcohol use 10.8 oz/week    18 Cans of beer per week  . Drug use:     Frequency: 5.0 times per week    Types: Marijuana     Comment: smokes marijuna "every now and then during the week"  . Sexual activity: Yes   Tobacco Counseling Ready to quit: No Counseling given: No   Activities of Daily Living In your present state of health, do you have any difficulty performing the following activities: 04/12/2016  Hearing? Y  Vision? N  Difficulty concentrating or making decisions? N  Walking or climbing stairs? N  Dressing or bathing? N  Doing errands, shopping? Y  Preparing Food and eating ? N  Using the Toilet? N  In the past six months, have you accidently leaked urine? N  Do you have problems with loss of bowel control? N  Managing your Medications? Y  Managing your Finances? Y  Housekeeping or managing your Housekeeping? N  Some recent  data might be hidden    Immunizations and Health Maintenance Immunization History  Administered Date(s) Administered  . DTaP 07[birth year]08/1956  . Hepatitis A 09/05/2014  . Hepatitis B 09/05/2014  . Influenza,inj,Quad PF,36+ Mos 03/24/2014, 03/17/2015, 12/21/2015  . Pneumococcal Conjugate-13 06/24/2014  . Pneumococcal Polysaccharide-23 07/07/2009, 08/03/2012  . Td 07/08/1951  . Tdap 05/17/2013  . Varicella 07/07/1957  . Zoster 07/07/2004   There are no preventive care reminders to display for this patient.  Patient Care Team: Gayland Curry, DO as PCP - General (Geriatric Medicine) Gayland Curry, DO (Geriatric Medicine)  Indicate any recent Medical Services you may have received from other than [facility] providers in the past year (date may be  approximate).    Assessment:   This is a routine wellness examination for Ac.  Hearing/Vision screen  Hearing Screening   125Hz  250Hz  500Hz  1000Hz  2000Hz  3000Hz  4000Hz  6000Hz  8000Hz   Right ear:   100 100 100  100    Left ear:   0 0 0  0    Comments: Pt has never had a hearing screen done. Tinnitus in left ear. Failed office hearing screen. Pt will think about a referral to audiology.   Vision Screening Comments: Pt states he had an eye exam done in summer of 2017. Unable to verify MD or Office done at.   Dietary issues and exercise activities discussed: Current Exercise Habits: The patient does not participate in regular exercise at present, Exercise limited by: None identified  Goals    . <enter goal here>          Starting 04/12/16, I will maintain my current lifestyle.       Depression Screen PHQ 2/9 Scores 04/12/2016 03/22/2016 12/21/2015 09/23/2014  PHQ - 2 Score 0 0 0 0    Fall Risk Fall Risk  04/12/2016 03/22/2016 12/21/2015 11/04/2014 09/23/2014  Falls in the past year? No No No No No    Cognitive Function: MMSE - Mini Mental State Exam 04/12/2016  Not completed: (No Data)  Orientation to time 5  Orientation to Place 4  Registration 3  Attention/ Calculation 0  Recall 3  Language- name 2 objects 2  Language- repeat 1  Language- follow 3 step command 2  Language- read & follow direction 0  Write a sentence 0  Copy design 0  Total score 20        Screening Tests Health Maintenance  Topic Date Due  . COLONOSCOPY  08/31/2017  . TETANUS/TDAP  05/18/2023  . INFLUENZA VACCINE  Completed  . ZOSTAVAX  Completed  . Hepatitis C Screening  Completed  . PNA vac Low Risk Adult  Completed        Plan:    I have personally reviewed and addressed the Medicare Annual Wellness questionnaire and have noted the following in the patient's chart:  A. Medical and social history B. Use of alcohol, tobacco or illicit drugs  C. Current medications and  supplements D. Functional ability and status E.  Nutritional status F.  Physical activity G. Advance directives H. List of other physicians I.  Hospitalizations, surgeries, and ER visits in previous 12 months J.  [place] to include hearing, vision, cognitive, depression L. Referrals and appointments - none  In addition, I have reviewed and discussed with patient certain preventive protocols, quality metrics, and best practice recommendations. A written personalized care plan for preventive services as well as general preventive health recommendations were provided to patient.  See attached scanned questionnaire for  additional information.   Signed,   Allyn Kenner, LPN Health Advisor

## 2016-04-12 NOTE — Progress Notes (Signed)
   I reviewed health advisor's note, was available for consultation and agree with the assessment and plan as written.    Runell Kovich L. Keiden Deskin, D.O. [facility] [address], [place] [address] Cell Phone (Mon-Fri 8am-5pm):  [phone] On Call:  [phone] & follow prompts after 5pm & weekends Office Phone:  [phone] Office Fax:  [phone]   Quick Notes   Health Maintenance:  Pt is up to date on maintenance.     Abnormal Screen: Yes; MMSE-20/30 Failed Clock test; pt stopped school after 7th grade, so he refused the spelling, writing and reading of the test.     Patient Concerns:  Pt has tinnitus in left ear, failed office hearing screen. He would like to discuss with you about a referral to audiology.     Nurse Concerns:  BP-180/100; Recheck: 172/98

## 2016-04-12 NOTE — Patient Instructions (Addendum)
Ryan Ortega , Thank you for taking time to come for your Medicare Wellness Visit. I appreciate your ongoing commitment to your health goals. Please review the following plan we discussed and let me know if I can assist you in the future.   These are the goals we discussed: Goals    . <enter goal here>          Starting 04/12/16, I will maintain my current lifestyle.        This is a list of the screening recommended for you and due dates:  Health Maintenance  Topic Date Due  . Colon Cancer Screening  08/31/2017  . Tetanus Vaccine  05/18/2023  . Flu Shot  Completed  . Shingles Vaccine  Completed  .  Hepatitis C: One time screening is recommended by Center for Disease Control  (CDC) for  adults born from [birth year] through 1966.   Completed  . Pneumonia vaccines  Completed  Preventive Care for Adults  A healthy lifestyle and preventive care can promote health and wellness. Preventive health guidelines for adults include the following key practices.  . A routine yearly physical is a good way to check with your health care provider about your health and preventive screening. It is a chance to share any concerns and updates on your health and to receive a thorough exam.  . Visit your dentist for a routine exam and preventive care every 6 months. Brush your teeth twice a day and floss once a day. Good oral hygiene prevents tooth decay and gum disease.  . The frequency of eye exams is based on your age, health, family medical history, use  of contact lenses, and other factors. Follow your health care provider's ecommendations for frequency of eye exams.  . Eat a healthy diet. Foods like vegetables, fruits, whole grains, low-fat dairy products, and lean protein foods contain the nutrients you need without too many calories. Decrease your intake of foods high in solid fats, added sugars, and salt. Eat the right amount of calories for you. Get information about a proper diet from your health care  provider, if necessary.  . Regular physical exercise is one of the most important things you can do for your health. Most adults should get at least 150 minutes of moderate-intensity exercise (any activity that increases your heart rate and causes you to sweat) each week. In addition, most adults need muscle-strengthening exercises on 2 or more days a week.  Silver Sneakers may be a benefit available to you. To determine eligibility, you may visit the website: www.silversneakers.com or contact program at 727-609-6878 Mon-Fri between 8AM-8PM.   . Maintain a healthy weight. The body mass index (BMI) is a screening tool to identify possible weight problems. It provides an estimate of body fat based on height and weight. Your health care provider can find your BMI and can help you achieve or maintain a healthy weight.   For adults 20 years and older: ? A BMI below 18.5 is considered underweight. ? A BMI of 18.5 to 24.9 is normal. ? A BMI of 25 to 29.9 is considered overweight. ? A BMI of 30 and above is considered obese.   . Maintain normal blood lipids and cholesterol levels by exercising and minimizing your intake of saturated fat. Eat a balanced diet with plenty of fruit and vegetables. Blood tests for lipids and cholesterol should begin at age 63 and be repeated every 5 years. If your lipid or cholesterol levels are high, you are  over 71, or you are at high risk for heart disease, you may need your cholesterol levels checked more frequently. Ongoing high lipid and cholesterol levels should be treated with medicines if diet and exercise are not working.  . If you smoke, find out from your health care provider how to quit. If you do not use tobacco, please do not start.  . If you choose to drink alcohol, please do not consume more than 2 drinks per day. One drink is considered to be 12 ounces (355 mL) of beer, 5 ounces (148 mL) of wine, or 1.5 ounces (44 mL) of liquor.  . If you are 38-79 years  old, ask your health care provider if you should take aspirin to prevent strokes.  . Use sunscreen. Apply sunscreen liberally and repeatedly throughout the day. You should seek shade when your shadow is shorter than you. Protect yourself by wearing long sleeves, pants, a wide-brimmed hat, and sunglasses year round, whenever you are outdoors.  . Once a month, do a whole body skin exam, using a mirror to look at the skin on your back. Tell your health care provider of new moles, moles that have irregular borders, moles that are larger than a pencil eraser, or moles that have changed in shape or color.

## 2016-06-20 ENCOUNTER — Ambulatory Visit (INDEPENDENT_AMBULATORY_CARE_PROVIDER_SITE_OTHER): Payer: Medicare Other | Admitting: Internal Medicine

## 2016-06-20 ENCOUNTER — Encounter: Payer: Self-pay | Admitting: Internal Medicine

## 2016-06-20 VITALS — BP 120/70 | HR 72 | Temp 97.7°F | Wt 140.0 lb

## 2016-06-20 DIAGNOSIS — F17210 Nicotine dependence, cigarettes, uncomplicated: Secondary | ICD-10-CM

## 2016-06-20 DIAGNOSIS — F1010 Alcohol abuse, uncomplicated: Secondary | ICD-10-CM | POA: Diagnosis not present

## 2016-06-20 DIAGNOSIS — M4802 Spinal stenosis, cervical region: Principal | ICD-10-CM

## 2016-06-20 DIAGNOSIS — M4712 Other spondylosis with myelopathy, cervical region: Secondary | ICD-10-CM | POA: Diagnosis not present

## 2016-06-20 DIAGNOSIS — R634 Abnormal weight loss: Secondary | ICD-10-CM | POA: Diagnosis not present

## 2016-06-20 DIAGNOSIS — Z13220 Encounter for screening for lipoid disorders: Secondary | ICD-10-CM | POA: Diagnosis not present

## 2016-06-20 DIAGNOSIS — R030 Elevated blood-pressure reading, without diagnosis of hypertension: Secondary | ICD-10-CM

## 2016-06-20 DIAGNOSIS — G992 Myelopathy in diseases classified elsewhere: Secondary | ICD-10-CM

## 2016-06-20 NOTE — Progress Notes (Signed)
Location:  [facility] clinic Provider:  Demarco Bacci L. Mariea Clonts, D.O., C.M.D.  Code Status: full code Goals of Care:  Advanced Directives 06/20/2016  Does Patient Have a Medical Advance Directive? No  Would patient like information on creating a medical advance directive? -  Pre-existing out of facility DNR order (yellow form or pink MOST form) -   Chief Complaint  Patient presents with  . Medical Management of Chronic Issues    107mth follow-up    HPI: Patient is a 71 y.o. male seen today for medical management of chronic diseases.  BP is much better this visit off of spam.  Today is 120/70.    Tobacco abuse--about the same.  Not ready to quit.  Has been trying to cut down for years and is smoking less than he once did and still less than 1/2ppd.  He has started to wear a soft brace on his wrist which does help a little with his neuropathic pain.  He has refused surgical eval for his cervical myelopathy and radiculopathy.    No new concerns.    Drinking a 40 per day of alcohol.  This is improved from previous when he was drinking 2+ of these.  Down 2 lbs which could be from cutting out spam and fluid retention.  Past Medical History:  Diagnosis Date  . Allergy   . Chronic hepatitis C without mention of hepatic coma   . Hearing loss of aging   . Internal hemorrhoids without mention of complication   . Lipoma of unspecified site   . Loss of weight   . Mild cognitive impairment, so stated   . Osteoarthrosis, unspecified whether generalized or localized, unspecified site   . Pain in joint, forearm   . Personal history of colonic adenoma 06/25/2012  . Rash and other nonspecific skin eruption   . Routine general medical examination at a health care facility   . Substance abuse   . Tinnitus of both ears   . Tobacco use disorder   . Unspecified visual loss     Past Surgical History:  Procedure Laterality Date  . COLONOSCOPY    . left shoulder  1990    No Known Allergies  Allergies as  of 06/20/2016   No Known Allergies     Medication List       Accurate as of 06/20/16  8:59 AM. Always use your most recent med list.          gabapentin 300 MG capsule Commonly known as:  NEURONTIN TAKE TWO CAPSULES BY MOUTH THREE TIMES DAILY FOR NECK PAIN AND NUMBNESS OF RIGHT ARM   mirtazapine 15 MG tablet Commonly known as:  REMERON Take 1 tablet (15 mg total) by mouth at bedtime.       Review of Systems:  Review of Systems  Constitutional: Positive for weight loss. Negative for chills, diaphoresis, fever and malaise/fatigue.  HENT: Negative for congestion and hearing loss.   Eyes: Negative for blurred vision.       Glasses  Respiratory: Negative for cough, hemoptysis, sputum production, shortness of breath and wheezing.   Cardiovascular: Negative for chest pain, palpitations and leg swelling.  Gastrointestinal: Negative for abdominal pain, blood in stool, constipation and melena.  Genitourinary: Negative for dysuria.  Musculoskeletal: Negative for falls.  Skin: Negative for itching and rash.  Neurological: Positive for tingling, sensory change and focal weakness. Negative for weakness.       Right greater than left hand with loss of interdigital muscles  and thenar eminence   Endo/Heme/Allergies: Does not bruise/bleed easily.  Psychiatric/Behavioral: Negative for depression and memory loss. The patient has insomnia.     Health Maintenance  Topic Date Due  . COLONOSCOPY  08/31/2017  . TETANUS/TDAP  05/18/2023  . INFLUENZA VACCINE  Completed  . Hepatitis C Screening  Completed  . PNA vac Low Risk Adult  Completed    Physical Exam: Vitals:   06/20/16 0841  BP: 120/70  Pulse: 72  Temp: 97.7 F (36.5 C)  TempSrc: Oral  SpO2: 98%  Weight: 140 lb (63.5 kg)   Body mass index is 23.3 kg/m. Physical Exam  Constitutional: He is oriented to person, place, and time. No distress.  Cachectic appearing male  Cardiovascular: Normal rate and regular rhythm.   Murmur  heard. Pulmonary/Chest: Effort normal and breath sounds normal. No respiratory distress. He has no wheezes.  Clubbing of digits  Abdominal: Bowel sounds are normal.  Musculoskeletal: Normal range of motion. He exhibits tenderness.  Right shoulder, wearing supportive brace on right wrist   Neurological: He is alert and oriented to person, place, and time.  Skin: Skin is warm and dry.    Labs reviewed: Basic Metabolic Panel:  Recent Labs  08/18/15 0815 03/22/16 0839  NA 137 137  K 4.0 4.4  CL 100 105  CO2 22 24  GLUCOSE 96 100*  BUN 12 8  CREATININE 0.76 0.72  CALCIUM 9.3 8.7   Liver Function Tests:  Recent Labs  08/18/15 0815 03/22/16 0839  AST 68* 41*  ALT 77* 40  ALKPHOS 85 103  BILITOT 0.8 0.8  PROT 7.3 7.4  ALBUMIN 4.0 3.6   No results for input(s): LIPASE, AMYLASE in the last 8760 hours. No results for input(s): AMMONIA in the last 8760 hours. CBC:  Recent Labs  03/22/16 0839  WBC 6.5  NEUTROABS 2,990  HGB 14.4  HCT 43.2  MCV 99.1  PLT 272   Lipid Panel:  Recent Labs  08/18/15 0815  CHOL 124  HDL 75  LDLCALC 38  TRIG 56  CHOLHDL 1.7   Assessment/Plan 1. Stenosis of cervical spine with myelopathy -ongoing, has some improvement in pain with gabapentin and hand pain with wrist brace  2. Smoking 1/2 pack a day or less -is smoking about the same as last visit  3. Alcohol abuse - is drinking 1 40 per day by report which is a reduction from the past - CBC with Differential/Platelet; Future - COMPLETE METABOLIC PANEL WITH GFR; Future  4. Loss of weight -down 2 more lbs but has stopped eating spam and other salty foods - CBC with Differential/Platelet; Future - COMPLETE METABOLIC PANEL WITH GFR; Future  5. Screening, lipid - will recheck lipid panel before next visit - CBC with Differential/Platelet; Future - COMPLETE METABOLIC PANEL WITH GFR; Future - Lipid panel; Future  6. Elevated blood pressure reading without diagnosis of  hypertension -last visit after eating a bunch of spam but now bp wnl  Labs/tests ordered:   Orders Placed This Encounter  Procedures  . CBC with Differential/Platelet    Standing Status:   Future    Standing Expiration Date:   02/20/2017  . COMPLETE METABOLIC PANEL WITH GFR    [facility]    Standing Status:   Future    Standing Expiration Date:   02/20/2017  . Lipid panel    Standing Status:   Future    Standing Expiration Date:   02/20/2017    Order Specific Question:  Has the patient fasted?    Answer:   Yes   Next appt:  10/24/2016  Monserrath Junio L. Nakema Fake, D.O. [facility] [address], [place] [address] Cell Phone (Mon-Fri 8am-5pm):  [phone] On Call:  [phone] & follow prompts after 5pm & weekends Office Phone:  [phone] Office Fax:  [phone]

## 2016-10-21 ENCOUNTER — Other Ambulatory Visit: Payer: Self-pay

## 2016-10-24 ENCOUNTER — Encounter: Payer: Self-pay | Admitting: Internal Medicine

## 2016-10-24 ENCOUNTER — Ambulatory Visit (INDEPENDENT_AMBULATORY_CARE_PROVIDER_SITE_OTHER): Payer: PPO | Admitting: Internal Medicine

## 2016-10-24 ENCOUNTER — Encounter (HOSPITAL_COMMUNITY): Payer: Self-pay

## 2016-10-24 ENCOUNTER — Emergency Department (HOSPITAL_COMMUNITY): Payer: PPO

## 2016-10-24 ENCOUNTER — Emergency Department (HOSPITAL_COMMUNITY)
Admission: EM | Admit: 2016-10-24 | Discharge: 2016-10-24 | Disposition: A | Discharge status: Home / Self Care | Payer: PPO | Attending: Emergency Medicine | Admitting: Emergency Medicine

## 2016-10-24 VITALS — BP 160/90 | HR 71 | Temp 97.5°F | Wt 129.0 lb

## 2016-10-24 DIAGNOSIS — F17210 Nicotine dependence, cigarettes, uncomplicated: Secondary | ICD-10-CM | POA: Insufficient documentation

## 2016-10-24 DIAGNOSIS — R1011 Right upper quadrant pain: Secondary | ICD-10-CM | POA: Diagnosis not present

## 2016-10-24 DIAGNOSIS — K5730 Diverticulosis of large intestine without perforation or abscess without bleeding: Secondary | ICD-10-CM | POA: Diagnosis not present

## 2016-10-24 DIAGNOSIS — F1010 Alcohol abuse, uncomplicated: Secondary | ICD-10-CM | POA: Diagnosis not present

## 2016-10-24 DIAGNOSIS — R634 Abnormal weight loss: Secondary | ICD-10-CM | POA: Diagnosis not present

## 2016-10-24 LAB — COMPREHENSIVE METABOLIC PANEL
ALT: 31 U/L (ref 17–63)
AST: 24 U/L (ref 15–41)
Albumin: 3.9 g/dL (ref 3.5–5.0)
Alkaline Phosphatase: 74 U/L (ref 38–126)
Anion gap: 12 (ref 5–15)
BUN: 5 mg/dL — ABNORMAL LOW (ref 6–20)
CO2: 18 mmol/L — ABNORMAL LOW (ref 22–32)
Calcium: 9.3 mg/dL (ref 8.9–10.3)
Chloride: 106 mmol/L (ref 101–111)
Creatinine, Ser: 1.08 mg/dL (ref 0.61–1.24)
GFR calc Af Amer: >60 mL/min (ref >60)
GFR calc non Af Amer: >60 mL/min (ref >60)
Glucose, Bld: 89 mg/dL (ref 65–99)
Potassium: 3.5 mmol/L (ref 3.5–5.1)
Sodium: 136 mmol/L (ref 135–145)
Total Bilirubin: 1.6 mg/dL — ABNORMAL HIGH (ref 0.3–1.2)
Total Protein: 7.5 g/dL (ref 6.5–8.1)

## 2016-10-24 LAB — CBC WITH DIFFERENTIAL/PLATELET
Basophils Absolute: 0 10*3/uL (ref 0.0–0.1)
Basophils Relative: 0 %
Eosinophils Absolute: 0.1 10*3/uL (ref 0.0–0.7)
Eosinophils Relative: 1 %
HCT: 43.7 % (ref 39.0–52.0)
Hemoglobin: 15.4 g/dL (ref 13.0–17.0)
Lymphocytes Relative: 23 %
Lymphs Abs: 1.4 10*3/uL (ref 0.7–4.0)
MCH: 34.1 pg — ABNORMAL HIGH (ref 26.0–34.0)
MCHC: 35.2 g/dL (ref 30.0–36.0)
MCV: 96.7 fL (ref 78.0–100.0)
Monocytes Absolute: 0.3 10*3/uL (ref 0.1–1.0)
Monocytes Relative: 5 %
Neutro Abs: 4.5 10*3/uL (ref 1.7–7.7)
Neutrophils Relative %: 71 %
Platelets: 182 10*3/uL (ref 150–400)
RBC: 4.52 MIL/uL (ref 4.22–5.81)
RDW: 12.5 % (ref 11.5–15.5)
WBC: 6.3 10*3/uL (ref 4.0–10.5)

## 2016-10-24 LAB — I-STAT TROPONIN, ED: Troponin i, poc: 0.02 ng/mL (ref 0.00–0.08)

## 2016-10-24 LAB — I-STAT CG4 LACTIC ACID, ED: Lactic Acid, Venous: 1.17 mmol/L (ref 0.5–1.9)

## 2016-10-24 LAB — URINALYSIS, ROUTINE W REFLEX MICROSCOPIC
Bacteria, UA: NONE SEEN
Bilirubin Urine: NEGATIVE
Glucose, UA: 50 mg/dL — AB
Hgb urine dipstick: NEGATIVE
Ketones, ur: 80 mg/dL — AB
Leukocytes, UA: NEGATIVE
Nitrite: NEGATIVE
Protein, ur: 100 mg/dL — AB
Specific Gravity, Urine: 1.016 (ref 1.005–1.030)
pH: 5 (ref 5.0–8.0)

## 2016-10-24 LAB — LIPASE, BLOOD: Lipase: 22 U/L (ref 11–51)

## 2016-10-24 MED ORDER — SUCRALFATE 1 GM/10ML PO SUSP: 1.0000 g | Freq: Three times a day (TID) | ORAL | 0 refills | Status: DC

## 2016-10-24 MED ORDER — OMEPRAZOLE 20 MG PO CPDR: 20.0000 mg | DELAYED_RELEASE_CAPSULE | Freq: Every day | ORAL | 0 refills | Status: DC

## 2016-10-24 MED ORDER — MORPHINE SULFATE (PF) 4 MG/ML IV SOLN
4.0000 mg | Freq: Once | INTRAVENOUS | Status: AC
  Administered 2016-10-24: 4 mg via INTRAVENOUS
  Filled 2016-10-24: qty 1

## 2016-10-24 MED ORDER — SODIUM CHLORIDE 0.9 % IV BOLUS (SEPSIS)
500.0000 mL | Freq: Once | INTRAVENOUS | Status: AC
  Administered 2016-10-24: 500 mL via INTRAVENOUS

## 2016-10-24 MED ORDER — ONDANSETRON HCL 4 MG/2ML IJ SOLN
4.0000 mg | Freq: Once | INTRAMUSCULAR | Status: AC
  Administered 2016-10-24: 4 mg via INTRAVENOUS
  Filled 2016-10-24: qty 2

## 2016-10-24 MED ORDER — IOPAMIDOL (ISOVUE-300) INJECTION 61%
INTRAVENOUS | Status: AC
  Administered 2016-10-24: 100 mL
  Filled 2016-10-24: qty 100

## 2016-10-24 NOTE — ED Provider Notes (Signed)
Emergency Department Provider Note   I have reviewed the triage vital signs and the nursing notes.   HISTORY  Chief Complaint Abdominal Pain   HPI Ryan Ortega is a 71 y.o. male with PMH of Hep C and EtOH use presents to the emergency department for evaluation of severe right-sided abdominal pain. Symptoms been ongoing for the last 2 weeks. He describes constant right-sided pain worse in the right upper quadrant without modifying factor. Specifically, no worsening pain with eating. He has no associated diarrhea or vomiting. He has had some mild constipation. No blood in the stools. Denies chest pain or difficulty breathing. No similar pain in the past. No radiation of symptoms. The patient was evaluated by his PCP today and referred to the emergency department. Patient does endorse some mild constipation with last bowel movement 3 days ago.   Past Medical History:  Diagnosis Date  . Allergy   . Chronic hepatitis C without mention of hepatic coma   . Hearing loss of aging   . Internal hemorrhoids without mention of complication   . Lipoma of unspecified site   . Loss of weight   . Mild cognitive impairment, so stated   . Osteoarthrosis, unspecified whether generalized or localized, unspecified site   . Pain in joint, forearm   . Personal history of colonic adenoma 06/25/2012  . Rash and other nonspecific skin eruption   . Routine general medical examination at a health care facility   . Substance abuse   . Tinnitus of both ears   . Tobacco use disorder   . Unspecified visual loss     Patient Active Problem List   Diagnosis Date Noted  . Screening, lipid 06/20/2016  . Pulmonary nodules/lesions, multiple 11/04/2014  . Tobacco abuse 11/04/2014  . 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 Alcohol abuse 11/04/2014  . Stenosis of cervical spine with myelopathy 10/04/2013  . Smoking 1/2 pack a day or less 10/04/2013  . Dyspnea on exertion 10/04/2013  .  Bilateral leg cramps 10/04/2013  . Chronic hepatitis C without hepatic coma ([place]) 10/04/2013  . Loss of weight   . Personal history of colonic adenoma 06/19/2009    Past Surgical History:  Procedure Laterality Date  . COLONOSCOPY    . left shoulder  1990    Current Outpatient Rx  . Order #: [id number] Class: Normal  . Order #: [id number] Class: Normal  . Order #: [id number] Class: Print  . Order #: [id number] Class: Print    Allergies Patient has no known allergies.  Family History  Problem Relation Age of Onset  . Cancer Mother   . Stroke Sister   . Breast cancer Sister   . Colitis Neg Hx   . Esophageal cancer Neg Hx   . Stomach cancer Neg Hx   . Rectal cancer Neg Hx     Social History Social History  Substance Use Topics  . Smoking status: Current Some Day Smoker    Packs/day: 1.00    Years: 40.00    Types: Cigarettes  . Smokeless tobacco: Never Used  . Alcohol use 10.8 oz/week    18 Cans of beer per week    Review of Systems  Constitutional: No fever/chills Eyes: No visual changes. ENT: No sore throat. Cardiovascular: Denies chest pain. Respiratory: Denies shortness of breath. Gastrointestinal: Positive right sided abdominal pain.  No nausea, no vomiting.  No diarrhea. Positive constipation. Genitourinary: Negative for dysuria. Musculoskeletal: Negative for back pain. Skin: Negative for rash. Neurological: Negative for  headaches, focal weakness or numbness.  10-point ROS otherwise negative.  ____________________________________________   PHYSICAL EXAM:  VITAL SIGNS: ED Triage Vitals  Enc Vitals Group     BP 10/24/16 0930 (!) 183/99     Pulse Rate 10/24/16 0930 66     Resp --      Temp 10/24/16 0920 98.4 F (36.9 C)     Temp Source 10/24/16 0920 Oral     SpO2 10/24/16 0930 98 %     Weight 10/24/16 0914 129 lb (58.5 kg)     Height 10/24/16 0920 5\' 6"  (1.676 m)     Pain Score 10/24/16 1000 10    Constitutional: Alert and oriented. Patient  appears uncomfortable.  Eyes: Conjunctivae are normal.  Head: Atraumatic. Nose: No congestion/rhinnorhea. Mouth/Throat: Mucous membranes are moist.  Oropharynx non-erythematous. Neck: No stridor.   Cardiovascular: Normal rate, regular rhythm. Good peripheral circulation. Grossly normal heart sounds.   Respiratory: Normal respiratory effort.  No retractions. Lungs CTAB. Gastrointestinal: Soft with focal right sided abdominal tenderness. No rebound or guarding. Worse in the RUQ but negative Murphy's sign. No distention.  Musculoskeletal: No lower extremity tenderness nor edema. No gross deformities of extremities. Neurologic:  Normal speech and language. No gross focal neurologic deficits are appreciated.  Skin:  Skin is warm, dry and intact. No rash noted.  ____________________________________________   LABS (all labs ordered are listed, but only abnormal results are displayed)  Labs Reviewed  COMPREHENSIVE METABOLIC PANEL - Abnormal; Notable for the following:       Result Value   CO2 18 (*)    BUN 5 (*)    Total Bilirubin 1.6 (*)    All other components within normal limits  CBC WITH DIFFERENTIAL/PLATELET - Abnormal; Notable for the following:    MCH 34.1 (*)    All other components within normal limits  URINALYSIS, ROUTINE W REFLEX MICROSCOPIC - Abnormal; Notable for the following:    Glucose, UA 50 (*)    Ketones, ur 80 (*)    Protein, ur 100 (*)    Squamous Epithelial / LPF 0-5 (*)    All other components within normal limits  LIPASE, BLOOD  I-STAT TROPOININ, ED  I-STAT CG4 LACTIC ACID, ED   ____________________________________________  EKG   EKG Interpretation  Date/Time:  Thursday October 24 2016 09:53:23 EDT Ventricular Rate:  69 PR Interval:    QRS Duration: 81 QT Interval:  425 QTC Calculation: 456 R Axis:   76 Text Interpretation:  Sinus rhythm Atrial premature complex Consider left atrial enlargement No STEMI.  Confirmed by Nanda Quinton [phone]) on 10/24/2016  10:13:50 AM       ____________________________________________  RADIOLOGY  Ct Abdomen Pelvis W Contrast  Result Date: 10/24/2016 CLINICAL DATA:  Right-sided abdominal pain, 2 weeks duration. EXAM: CT ABDOMEN AND PELVIS WITH CONTRAST TECHNIQUE: Multidetector CT imaging of the abdomen and pelvis was performed using the standard protocol following bolus administration of intravenous contrast. CONTRAST:  111mL ISOVUE-300 IOPAMIDOL (ISOVUE-300) INJECTION 61% COMPARISON:  CT chest 09/19/2015 FINDINGS: Lower chest: There is infiltrate/volume loss in the left lower lobe. Right lung base is clear. Heart size is normal. No pericardial fluid. Hepatobiliary: Normal Pancreas: Normal Spleen: Normal Adrenals/Urinary Tract: Adrenal glands are normal. Kidneys are normal except for a few simple cysts on the right. Bladder is normal. Stomach/Bowel: The appendix is normal. There is diverticulosis without evidence of diverticulitis. No small bowel abnormality is seen. Vascular/Lymphatic: Aortic atherosclerosis without aneurysm. IVC is normal. No retroperitoneal mass or lymphadenopathy.  Reproductive: Normal Other: No free fluid or air. Musculoskeletal: Ordinary degenerative spondylosis. No acute finding. IMPRESSION: No cause of right-sided abdominal pain identified. Diverticulosis of the right: Without imaging evidence of diverticulitis. The appendix appears normal by CT. Left lower lobe atelectasis/pneumonia. Aortic atherosclerosis. Electronically Signed   By: Nelson Chimes M.D.   On: 10/24/2016 11:03    ____________________________________________   PROCEDURES  Procedure(s) performed:   Procedures  None ____________________________________________   INITIAL IMPRESSION / ASSESSMENT AND PLAN / ED COURSE  Pertinent labs & imaging results that were available during my care of the patient were reviewed by me and considered in my medical decision making (see chart for details).  Patient presents to the  emergency department for evaluation of right-sided abdominal pain has some mild constipation. He has focal right sided abdominal tenderness in both the upper and lower quadrants. Negative Murphy sign. 2 weeks of pain has gradually worsening. Patient appears uncomfortable but is in no acute distress. Low suspicion for vascular etiology of pain given the location. Doubt ischemic colitis given his focal discomfort but plan to obtain i-STAT lactate. I also added a troponin and EKG with his pain focused in the right upper quadrant partially overlying his right lower chest wall. Plan to obtain CT scan for further evaluation. No rectal pain.   11:27 AM CT scan with no acute abdominal findings. There is some atelectasis at the left lung base but patient has no signs or symptoms to suggest pneumonia. Plan for management of gastritis at home with plan for PCP follow-up and possible GI referral. With 2 weeks of constant pain no troponin trending.   At this time, I do not feel there is any life-threatening condition present. I have reviewed and discussed all results (EKG, imaging, lab, urine as appropriate), exam findings with patient. I have reviewed nursing notes and appropriate previous records.  I feel the patient is safe to be discharged home without further emergent workup. Discussed usual and customary return precautions. Patient and family (if present) verbalize understanding and are comfortable with this plan.  Patient will follow-up with their primary care provider. If they do not have a primary care provider, information for follow-up has been provided to them. All questions have been answered.  ____________________________________________  FINAL CLINICAL IMPRESSION(S) / ED DIAGNOSES  Final diagnoses:  Right upper quadrant abdominal pain     MEDICATIONS GIVEN DURING THIS VISIT:  Medications  morphine 4 MG/ML injection 4 mg (4 mg Intravenous Given 10/24/16 1000)  ondansetron (ZOFRAN) injection 4 mg  (4 mg Intravenous Given 10/24/16 1000)  sodium chloride 0.9 % bolus 500 mL (0 mLs Intravenous Stopped 10/24/16 1035)  iopamidol (ISOVUE-300) 61 % injection (100 mLs  Contrast Given 10/24/16 1044)     NEW OUTPATIENT MEDICATIONS STARTED DURING THIS VISIT:  New Prescriptions   OMEPRAZOLE (PRILOSEC) 20 MG CAPSULE    Take 1 capsule (20 mg total) by mouth daily.   SUCRALFATE (CARAFATE) 1 GM/10ML SUSPENSION    Take 10 mLs (1 g total) by mouth 4 (four) times daily -  with meals and at bedtime.      Note:  This document was prepared using Dragon voice recognition software and may include unintentional dictation errors.  Nanda Quinton, MD Emergency Medicine    Long, [facility], MD 10/24/16 1136

## 2016-10-24 NOTE — ED Triage Notes (Signed)
Pt brought in by EMS from MD office for RUQ ABD pain. Per pt, pain has been going on for about 2 weeks. Pt endorses loss of appetite. Pt denies n/v/d.

## 2016-10-24 NOTE — Discharge Instructions (Signed)

## 2016-10-24 NOTE — Progress Notes (Signed)
Location:  [facility] clinic Provider:  Nataliyah Packham L. Mariea Clonts, D.O., C.M.D.  Code Status: full code Goals of Care:  Advanced Directives 10/24/2016  Does Patient Have a Medical Advance Directive? No  Would patient like information on creating a medical advance directive? No - Patient declined  Pre-existing out of facility DNR order (yellow form or pink MOST form) -    Chief Complaint  Patient presents with  . Medical Management of Chronic Issues    27mth follow-up, abdominal pain x2 weeks    HPI: Patient is a 71 y.o. male seen today for medical management of chronic diseases.    Whole abdomen hurting for 2 weeks.  10/10 pain.  Has taken medication for upset stomach otc.  Not vomiting, nausea.  Is having bms every now and then, but hardly eating.  Normal BMs.  No blood.  No black stool.  Last ate yesterday morning.  Last BM Monday (4 days ago).  He has been losing weight and is now down 11 lbs since March.  He has his gallbladder and appendix in place.  He has a h/o alcohol and tobacco abuse.  He has had a similar pain before, but it got better with "upset stomach medication".  Past Medical History:  Diagnosis Date  . Allergy   . Chronic hepatitis C without mention of hepatic coma   . Hearing loss of aging   . Internal hemorrhoids without mention of complication   . Lipoma of unspecified site   . Loss of weight   . Mild cognitive impairment, so stated   . Osteoarthrosis, unspecified whether generalized or localized, unspecified site   . Pain in joint, forearm   . Personal history of colonic adenoma 06/25/2012  . Rash and other nonspecific skin eruption   . Routine general medical examination at a health care facility   . Substance abuse   . Tinnitus of both ears   . Tobacco use disorder   . Unspecified visual loss     Past Surgical History:  Procedure Laterality Date  . COLONOSCOPY    . left shoulder  1990    No Known Allergies  Allergies as of 10/24/2016   No Known Allergies     Medication List       Accurate as of 10/24/16  8:26 AM. Always use your most recent med list.          gabapentin 300 MG capsule Commonly known as:  NEURONTIN TAKE TWO CAPSULES BY MOUTH THREE TIMES DAILY FOR NECK PAIN AND NUMBNESS OF RIGHT ARM   mirtazapine 15 MG tablet Commonly known as:  REMERON Take 1 tablet (15 mg total) by mouth at bedtime.       Review of Systems:  Review of Systems  Constitutional: Positive for malaise/fatigue and weight loss. Negative for chills and fever.  HENT: Negative for congestion.   Eyes:       Glasses  Respiratory: Negative for cough and shortness of breath.   Cardiovascular: Negative for chest pain and palpitations.  Gastrointestinal: Positive for abdominal pain and nausea. Negative for blood in stool, constipation, diarrhea, melena and vomiting.  Genitourinary: Negative for dysuria, frequency and urgency.  Musculoskeletal: Positive for neck pain.  Skin: Negative for itching and rash.  Neurological: Positive for tingling, sensory change and weakness.  Psychiatric/Behavioral: Negative for depression and memory loss. The patient is not nervous/anxious.        Alcohol and tobacco abuse    Health Maintenance  Topic Date Due  .  INFLUENZA VACCINE  11/13/2016  . COLONOSCOPY  08/31/2017  . TETANUS/TDAP  05/18/2023  . Hepatitis C Screening  Completed  . PNA vac Low Risk Adult  Completed    Physical Exam: Vitals:   10/24/16 0818  BP: (!) 160/90  Pulse: 71  Temp: (!) 97.5 F (36.4 C)  TempSrc: Oral  SpO2: 97%  Weight: 129 lb (58.5 kg)   Body mass index is 21.47 kg/m. Physical Exam  Constitutional: He is oriented to person, place, and time. He appears well-developed and well-nourished.  Appears distressed with high bp, HR in 90s-100s during examination  HENT:  Head: Normocephalic and atraumatic.  Cardiovascular: Normal rate, regular rhythm, normal heart sounds and intact distal pulses.   Pulmonary/Chest: Effort normal and breath  sounds normal. No respiratory distress.  Abdominal: Soft. Bowel sounds are normal. He exhibits no distension and no mass. There is tenderness. There is guarding. There is no rebound. No hernia.  RUQ and midepigastric tenderness  Musculoskeletal: Normal range of motion. He exhibits no tenderness.  Neurological: He is alert and oriented to person, place, and time.  Skin: Skin is warm and dry. Capillary refill takes less than 2 seconds.  Psychiatric: He has a normal mood and affect.    Labs reviewed: Basic Metabolic Panel:  Recent Labs  03/22/16 0839  NA 137  K 4.4  CL 105  CO2 24  GLUCOSE 100*  BUN 8  CREATININE 0.72  CALCIUM 8.7   Liver Function Tests:  Recent Labs  03/22/16 0839  AST 41*  ALT 40  ALKPHOS 103  BILITOT 0.8  PROT 7.4  ALBUMIN 3.6   No results for input(s): LIPASE, AMYLASE in the last 8760 hours. No results for input(s): AMMONIA in the last 8760 hours. CBC:  Recent Labs  03/22/16 0839  WBC 6.5  NEUTROABS 2,990  HGB 14.4  HCT 43.2  MCV 99.1  PLT 272   Assessment/Plan 1. Right upper quadrant pain -Acute RUQ to midepigastric pain with loss of weight, poor appetite, nausea, lethargy, h/o alcohol and tobacco abuse -differential:  Cholecystitis, pancreatitis, ischemic bowel as pain is out of proportion to exam in smoker  2. Smoking 1/2 pack a day or less -ongoing, puts him at risk for malignancy  3. Alcohol abuse -not in past few days due to illness, but has been chronically abusing alcohol, at risk for withdrawal and pancreatitis  4. Loss of weight -ongoing, more rapidly progressive in past 2 weeks with minimal po intake  REFERRED TO ED DUE TO HTN, TACHYCARDIA, ACUTE RUQ 10/10 AND MIDEPIGASTRIC PAIN WITH H/O ALCOHOL AND TOBACCO ABUSE  Labs/tests ordered:  No orders of the defined types were placed in this encounter.  Next appt:  F/U post-hospitalization  Diyan Dave L. Emannuel Vise, D.O. [facility] [address], [place] [address] Cell Phone (Mon-Fri 8am-5pm):  [phone] On Call:  [phone] & follow prompts after 5pm & weekends Office Phone:  [phone] Office Fax:  [phone]

## 2016-10-24 NOTE — ED Notes (Signed)
Pt is in stable condition upon d/c and ambulates from ED. 

## 2017-01-28 ENCOUNTER — Emergency Department (HOSPITAL_COMMUNITY)
Admission: EM | Admit: 2017-01-28 | Discharge: 2017-01-29 | Disposition: A | Discharge status: Home / Self Care | Payer: PPO | Attending: Emergency Medicine | Admitting: Emergency Medicine

## 2017-01-28 DIAGNOSIS — R079 Chest pain, unspecified: Secondary | ICD-10-CM | POA: Diagnosis not present

## 2017-01-28 DIAGNOSIS — K4090 Unilateral inguinal hernia, without obstruction or gangrene, not specified as recurrent: Secondary | ICD-10-CM | POA: Diagnosis not present

## 2017-01-28 DIAGNOSIS — F17210 Nicotine dependence, cigarettes, uncomplicated: Secondary | ICD-10-CM | POA: Diagnosis not present

## 2017-01-28 DIAGNOSIS — Z79899 Other long term (current) drug therapy: Secondary | ICD-10-CM | POA: Diagnosis not present

## 2017-01-28 DIAGNOSIS — R1011 Right upper quadrant pain: Secondary | ICD-10-CM | POA: Diagnosis not present

## 2017-01-28 DIAGNOSIS — R0781 Pleurodynia: Secondary | ICD-10-CM | POA: Diagnosis not present

## 2017-01-28 NOTE — ED Triage Notes (Signed)
Per EMS pt from home, R sided pain radiated to flank no tenderness, pressure started yesterday and is getting worse. 324 mg aspirin, 1 nitro given, no cardiac h/s

## 2017-01-29 ENCOUNTER — Encounter (HOSPITAL_COMMUNITY): Payer: Self-pay | Admitting: Emergency Medicine

## 2017-01-29 ENCOUNTER — Emergency Department (HOSPITAL_COMMUNITY): Payer: PPO

## 2017-01-29 DIAGNOSIS — R1011 Right upper quadrant pain: Secondary | ICD-10-CM | POA: Diagnosis not present

## 2017-01-29 DIAGNOSIS — R0781 Pleurodynia: Secondary | ICD-10-CM | POA: Diagnosis not present

## 2017-01-29 DIAGNOSIS — R079 Chest pain, unspecified: Secondary | ICD-10-CM | POA: Diagnosis not present

## 2017-01-29 LAB — CBC
HCT: 44.1 % (ref 39.0–52.0)
Hemoglobin: 15.4 g/dL (ref 13.0–17.0)
MCH: 33.6 pg (ref 26.0–34.0)
MCHC: 34.9 g/dL (ref 30.0–36.0)
MCV: 96.3 fL (ref 78.0–100.0)
Platelets: 167 10*3/uL (ref 150–400)
RBC: 4.58 MIL/uL (ref 4.22–5.81)
RDW: 12.3 % (ref 11.5–15.5)
WBC: 8.8 10*3/uL (ref 4.0–10.5)

## 2017-01-29 LAB — URINALYSIS, ROUTINE W REFLEX MICROSCOPIC
Bacteria, UA: NONE SEEN
Bilirubin Urine: NEGATIVE
Glucose, UA: NEGATIVE mg/dL
Hgb urine dipstick: NEGATIVE
Ketones, ur: NEGATIVE mg/dL
Leukocytes, UA: NEGATIVE
Nitrite: NEGATIVE
Protein, ur: 30 mg/dL — AB
Specific Gravity, Urine: >1.046 — ABNORMAL HIGH (ref 1.005–1.030)
Squamous Epithelial / LPF: NONE SEEN
pH: 5 (ref 5.0–8.0)

## 2017-01-29 LAB — BASIC METABOLIC PANEL
Anion gap: 10 (ref 5–15)
BUN: 7 mg/dL (ref 6–20)
CO2: 21 mmol/L — ABNORMAL LOW (ref 22–32)
Calcium: 9.2 mg/dL (ref 8.9–10.3)
Chloride: 104 mmol/L (ref 101–111)
Creatinine, Ser: 0.75 mg/dL (ref 0.61–1.24)
GFR calc Af Amer: >60 mL/min (ref >60)
GFR calc non Af Amer: >60 mL/min (ref >60)
Glucose, Bld: 140 mg/dL — ABNORMAL HIGH (ref 65–99)
Potassium: 3.5 mmol/L (ref 3.5–5.1)
Sodium: 135 mmol/L (ref 135–145)

## 2017-01-29 LAB — LIPASE, BLOOD: Lipase: 24 U/L (ref 11–51)

## 2017-01-29 LAB — HEPATIC FUNCTION PANEL
ALT: 26 U/L (ref 17–63)
AST: 21 U/L (ref 15–41)
Albumin: 3.6 g/dL (ref 3.5–5.0)
Alkaline Phosphatase: 61 U/L (ref 38–126)
Bilirubin, Direct: 0.4 mg/dL (ref 0.1–0.5)
Indirect Bilirubin: 1.9 mg/dL — ABNORMAL HIGH (ref 0.3–0.9)
Total Bilirubin: 2.3 mg/dL — ABNORMAL HIGH (ref 0.3–1.2)
Total Protein: 7.5 g/dL (ref 6.5–8.1)

## 2017-01-29 LAB — I-STAT TROPONIN, ED: Troponin i, poc: 0.01 ng/mL (ref 0.00–0.08)

## 2017-01-29 MED ORDER — ONDANSETRON 4 MG PO TBDP: 4.0000 mg | ORAL_TABLET | Freq: Three times a day (TID) | ORAL | 0 refills | Status: DC | PRN

## 2017-01-29 MED ORDER — IOPAMIDOL (ISOVUE-300) INJECTION 61%
INTRAVENOUS | Status: AC
  Filled 2017-01-29: qty 100

## 2017-01-29 MED ORDER — DOCUSATE SODIUM 100 MG PO CAPS: 100.0000 mg | ORAL_CAPSULE | Freq: Two times a day (BID) | ORAL | 0 refills | Status: DC

## 2017-01-29 MED ORDER — HYDROCODONE-ACETAMINOPHEN 5-325 MG PO TABS: 1.0000 | ORAL_TABLET | Freq: Four times a day (QID) | ORAL | 0 refills | Status: DC | PRN

## 2017-01-29 MED ORDER — ONDANSETRON HCL 4 MG/2ML IJ SOLN
4.0000 mg | Freq: Once | INTRAMUSCULAR | Status: AC
  Administered 2017-01-29: 4 mg via INTRAVENOUS
  Filled 2017-01-29: qty 2

## 2017-01-29 MED ORDER — IOPAMIDOL (ISOVUE-370) INJECTION 76%
INTRAVENOUS | Status: AC
  Administered 2017-01-29: 100 mL
  Filled 2017-01-29: qty 100

## 2017-01-29 MED ORDER — FENTANYL CITRATE (PF) 100 MCG/2ML IJ SOLN
50.0000 ug | Freq: Once | INTRAMUSCULAR | Status: AC
  Administered 2017-01-29: 50 ug via INTRAVENOUS
  Filled 2017-01-29: qty 2

## 2017-01-29 NOTE — ED Notes (Signed)
PT states understanding of care given, follow up care, and medication prescribed. PT ambulated from ED to car with a steady gait. 

## 2017-01-29 NOTE — ED Provider Notes (Signed)
TIME SEEN: 12:20 AM  CHIEF COMPLAINT: abdominal pain  HPI: Pt is a 71 y.o. male with history of hepatitis C who presents to the emergency department with right-sided chest pain that has now moved into the upper abdomen. States pain started last week. Patient describes the pain as an achy pain. No aggravating or relieving factors. He has had subjective fevers, diaphoresis. No shortness of breath, cough, nausea, vomiting or diarrhea. Patient denies history of abdominal surgeries. Patient is a very poor historian.  He denies chest pain currently.  ROS: See HPI Constitutional: subjective fever Eyes: no drainage  ENT: no runny nose   Cardiovascular:  no chest pain  Resp: no SOB  GI: no vomiting GU: no dysuria Integumentary: no rash  Allergy: no hives  Musculoskeletal: no leg swelling  Neurological: no slurred speech ROS otherwise negative  PAST MEDICAL HISTORY/PAST SURGICAL HISTORY:  Past Medical History:  Diagnosis Date  . Allergy   . Chronic hepatitis C without mention of hepatic coma   . Hearing loss of aging   . Internal hemorrhoids without mention of complication   . Lipoma of unspecified site   . Loss of weight   . Mild cognitive impairment, so stated   . Osteoarthrosis, unspecified whether generalized or localized, unspecified site   . Pain in joint, forearm   . Personal history of colonic adenoma 06/25/2012  . Rash and other nonspecific skin eruption   . Routine general medical examination at a health care facility   . Substance abuse ([place])   . Tinnitus of both ears   . Tobacco use disorder   . Unspecified visual loss     MEDICATIONS:  Prior to Admission medications   Medication Sig Start Date End Date Taking? Authorizing Provider  gabapentin (NEURONTIN) 300 MG capsule TAKE TWO CAPSULES BY MOUTH THREE TIMES DAILY FOR NECK PAIN AND NUMBNESS OF RIGHT ARM 12/19/15  Yes Reed, Tiffany L, DO  mirtazapine (REMERON) 15 MG tablet Take 1 tablet (15 mg total) by mouth at bedtime.  03/22/16  Yes Reed, Tiffany L, DO  omeprazole (PRILOSEC) 20 MG capsule Take 1 capsule (20 mg total) by mouth daily. 10/24/16 01/29/17 Yes Long, Wonda Olds, MD    ALLERGIES:  No Known Allergies  SOCIAL HISTORY:  Social History  Substance Use Topics  . Smoking status: Current Some Day Smoker    Packs/day: 1.00    Years: 40.00    Types: Cigarettes  . Smokeless tobacco: Never Used  . Alcohol use 10.8 oz/week    18 Cans of beer per week    FAMILY HISTORY: Family History  Problem Relation Age of Onset  . Cancer Mother   . Stroke Sister   . Breast cancer Sister   . Colitis Neg Hx   . Esophageal cancer Neg Hx   . Stomach cancer Neg Hx   . Rectal cancer Neg Hx     EXAM: BP (!) 162/74   Pulse 61   Temp 98.3 F (36.8 C) (Oral)   Resp (!) 21   Ht 5\' 6"  (1.676 m)   Wt 54.4 kg (120 lb)   SpO2 95%   BMI 19.37 kg/m  CONSTITUTIONAL: Alert and oriented and responds appropriately to questions. Elderly, chronically ill-appearing, appears uncomfortable HEAD: Normocephalic EYES: Conjunctivae clear, pupils appear equal, EOMI ENT: normal nose; moist mucous membranes NECK: Supple, no meningismus, no nuchal rigidity, no LAD  CARD: RRR; S1 and S2 appreciated; no murmurs, no clicks, no rubs, no gallops CHEST:  Chest wall is  nontender to palpation.  No crepitus, ecchymosis, erythema, warmth, rash or other lesions present.   RESP: Normal chest excursion without splinting or tachypnea; breath sounds clear and equal bilaterally; no wheezes, no rhonchi, no rales, no hypoxia or respiratory distress, speaking full sentences ABD/GI: Normal bowel sounds; non-distended; soft, tender in the center of the abdomen, negative Murphy sign, no rebound, no guarding, no peritoneal signs, no hepatosplenomegaly, right inguinal hernia that is reducible with out overlying erythema, warmth, ecchymosis or signs of necrosis BACK:  The back appears normal and is non-tender to palpation, there is no CVA tenderness EXT:  Normal ROM in all joints; non-tender to palpation; no edema; normal capillary refill; no cyanosis, no calf tenderness or swelling    SKIN: Normal color for age and race; warm; no rash NEURO: Moves all extremities equally PSYCH: The patient's mood and manner are appropriate. Grooming and personal hygiene are appropriate.  MEDICAL DECISION MAKING: Pt here with right-sided lateral chest pain that's now moved into the abdomen. Differential diagnosis includes cholelithiasis, cholecystitis, pancreatitis, kidney stone, UTI, PE, dissection. Low suspicion for ACS. Labs obtained in triage are unremarkable including negative troponin. No leukocytosis. Normal creatinine. We'll add on LFTs and lipase. Chest x-ray clear. EKG shows no ischemic abnormality. We'll obtain CT of the chest, abdomen and pelvis for further violation. We'll give fentanyl, Zofran for symptomatic relief.  ED PROGRESS: Patient's labs are unremarkable. No leukocytosis. Normal LFTs Pe[place] and creatinine. Troponin negative. Urine shows no sign of infection or blood.CT scan shows a new right inguinal hernia containing distal ileal loops that may be causing an early bowel obstruction. No sign of incarceration. No dissection. No pneumonia. No other acute abnormality. On examination, patient has a small hernia that is easily reducible. He states he is no longer having pain. He has no abdominal distention. No vomiting. Eating and drinking well. Normal bowel movements. Doubt bowel obstruction based on clinical exam. We'll fluid challenge patient.   4:05 AM  Pt tolerating fluid challenge without difficulty. With outpatient general surgery follow-up as needed. He has a PCP. We'll discharge a short course of pain medication and nausea medicine. Discussed return precautions for his right inguinal hernia. Patient comfortable with this plan.   At this time, I do not feel there is any life-threatening condition present. I have reviewed and discussed all results  (EKG, imaging, lab, urine as appropriate) and exam findings with patient/family. I have reviewed nursing notes and appropriate previous records.  I feel the patient is safe to be discharged home without further emergent workup and can continue workup as an outpatient as needed. Discussed usual and customary return precautions. Patient/family verbalize understanding and are comfortable with this plan.  Outpatient follow-up has been provided if needed. All questions have been answered.    EKG Interpretation  Date/Time:  Wednesday January 29 2017 00:12:33 EDT Ventricular Rate:  57 PR Interval:    QRS Duration: 97 QT Interval:  445 QTC Calculation: 434 R Axis:   68 Text Interpretation:  Sinus rhythm No significant change since last tracing Confirmed by Mackenzie Groom, Cyril Mourning [phone]) on 01/29/2017 12:20:56 AM         Bannie Lobban, Delice Bison, DO 01/29/17 [id number]

## 2017-01-29 NOTE — Discharge Instructions (Signed)
You have a hernia in your right lower abdomen which is likely the cause of your pain today. If this hernia changes colors or is red or hot, it does not reduce (push back flat into your abdomen), you have fever with it, pain is uncontrolled, vomiting that is uncontrolled, please return to the hospital. You may follow up with your primary care physician for this. You may also follow-up with a general surgeon to see if elective surgery is an option if this continues to bother you.

## 2017-02-03 ENCOUNTER — Ambulatory Visit (INDEPENDENT_AMBULATORY_CARE_PROVIDER_SITE_OTHER): Payer: PPO | Admitting: Nurse Practitioner

## 2017-02-03 ENCOUNTER — Encounter: Payer: Self-pay | Admitting: Nurse Practitioner

## 2017-02-03 VITALS — BP 130/84 | HR 79 | Temp 97.3°F | Ht 66.0 in | Wt 123.0 lb

## 2017-02-03 DIAGNOSIS — R634 Abnormal weight loss: Secondary | ICD-10-CM | POA: Diagnosis not present

## 2017-02-03 DIAGNOSIS — R109 Unspecified abdominal pain: Secondary | ICD-10-CM

## 2017-02-03 DIAGNOSIS — R110 Nausea: Secondary | ICD-10-CM | POA: Diagnosis not present

## 2017-02-03 NOTE — Progress Notes (Signed)
Careteam: Patient Care Team: Gayland Curry, DO as PCP - General (Geriatric Medicine) Gayland Curry, DO (Geriatric Medicine)  Advanced Directive information    No Known Allergies  Chief Complaint  Patient presents with  . Follow-up    ER follow-up: Hernia. Patient c/o unable to eat anything, able to tolerate fluids   . Medication Refill    Discuss Prilosec, ? if patient needs to take and if yes needs rx   . Immunizations    Flu vaccine today if provider agrees      HPI: Patient is a 71 y.o. male seen in the office today for follow up for ED visti on 01/28/17. He presented to ED for right-sided chest pain that moved into the upper abdomen. States pain started three weeks ago. Patient described the pain as an achy pain. No aggravating or relieving factors, shortness of breath, cough, nausea, vomiting or diarrhea.   Of note he was sent to ED in July due to RUQ pain. CT scan with no acute abdominal findings. There was noted to have some atelectasis at the left lung base but patient had no signs or symptoms to suggest pneumonia. Plan was for management of gastritis at home with PCP follow-up and possible GI referral. He never followed up in office after ED visit.   During recent ED visit CT unremarkable showed new right sided inguinal hernia with possible early SBO in ED 11/29/16. Had BM (diarrhea) today.  Patient states that he has a reduced appetite and has not eaten solid food in 8 days. He says his only diety intake has been orange juice. Patient has gained 3 lbs since ED visit. Wife reports he has eaten and he eats a lot of greasy foods and had chicken the other day (pt states he gave this to the dog).   Patient is a poor historian wife does not contribute a lot either. Unsure of medication taking but reported the medication from the ED (hydrocodone and zofran) were no help . conts to tolerate fluids without problem.  No vomiting noted.  Wife reports she has been encouraging  bland diet.   Review of Systems:  Review of Systems  Constitutional: Negative for chills, fever and weight loss.  HENT: Negative for hearing loss and sinus pain.   Eyes: Negative for blurred vision and pain.  Respiratory: Negative for cough and shortness of breath.   Cardiovascular: Negative for chest pain and palpitations.  Gastrointestinal: Positive for abdominal pain, blood in stool, diarrhea and nausea. Negative for vomiting.  Genitourinary: Negative for dysuria and frequency.  Musculoskeletal: Negative for myalgias.  Neurological: Positive for dizziness.  Psychiatric/Behavioral: Negative for depression and suicidal ideas.    Past Medical History:  Diagnosis Date  . Allergy   . Chronic hepatitis C without mention of hepatic coma   . Hearing loss of aging   . Internal hemorrhoids without mention of complication   . Lipoma of unspecified site   . Loss of weight   . Mild cognitive impairment, so stated   . Osteoarthrosis, unspecified whether generalized or localized, unspecified site   . Pain in joint, forearm   . Personal history of colonic adenoma 06/25/2012  . Rash and other nonspecific skin eruption   . Routine general medical examination at a health care facility   . Substance abuse ([place])   . Tinnitus of both ears   . Tobacco use disorder   . Unspecified visual loss    Past Surgical History:  Procedure  Laterality Date  . COLONOSCOPY    . left shoulder  1990   Social History:   reports that he has been smoking Cigarettes.  He has a 40.00 pack-year smoking history. He has never used smokeless tobacco. He reports that he drinks about 10.8 oz of alcohol per week . He reports that he uses drugs, including Marijuana, about 5 times per week.  Family History  Problem Relation Age of Onset  . Cancer Mother   . Stroke Sister   . Breast cancer Sister   . Colitis Neg Hx   . Esophageal cancer Neg Hx   . Stomach cancer Neg Hx   . Rectal cancer Neg Hx      Medications: Patient's Medications  New Prescriptions   No medications on file  Previous Medications   DOCUSATE SODIUM (COLACE) 100 MG CAPSULE    Take 1 capsule (100 mg total) by mouth every 12 (twelve) hours.   GABAPENTIN (NEURONTIN) 300 MG CAPSULE    TAKE TWO CAPSULES BY MOUTH THREE TIMES DAILY FOR NECK PAIN AND NUMBNESS OF RIGHT ARM   HYDROCODONE-ACETAMINOPHEN (NORCO/VICODIN) 5-325 MG TABLET    Take 1-2 tablets by mouth every 6 (six) hours as needed.   MIRTAZAPINE (REMERON) 15 MG TABLET    Take 1 tablet (15 mg total) by mouth at bedtime.   OMEPRAZOLE (PRILOSEC) 20 MG CAPSULE    Take 1 capsule (20 mg total) by mouth daily.   ONDANSETRON (ZOFRAN ODT) 4 MG DISINTEGRATING TABLET    Take 1 tablet (4 mg total) by mouth every 8 (eight) hours as needed for nausea or vomiting.  Modified Medications   No medications on file  Discontinued Medications   No medications on file     Physical Exam:  Vitals:   02/03/17 1247  BP: 130/84  Pulse: 79  Temp: (!) 97.3 F (36.3 C)  TempSrc: Oral  SpO2: 98%  Weight: 123 lb (55.8 kg)  Height: '5\' 6"'  (1.676 m)   Body mass index is 19.85 kg/m.  Physical Exam  Constitutional: He is oriented to person, place, and time. He appears well-developed and well-nourished. No distress.  HENT:  Head: Normocephalic and atraumatic.  Nose: Nose normal.  Mouth/Throat: Oropharynx is clear and moist. No oropharyngeal exudate.  Eyes: Pupils are equal, round, and reactive to light. Conjunctivae and EOM are normal. Right eye exhibits no discharge. Left eye exhibits no discharge. No scleral icterus.  Neck: Normal range of motion. Neck supple. No JVD present. No tracheal deviation present.  Cardiovascular: Normal rate, regular rhythm, normal heart sounds and intact distal pulses.   No murmur heard. Pulmonary/Chest: Effort normal and breath sounds normal. No respiratory distress.  Abdominal: Soft. Bowel sounds are normal. There is tenderness.    Musculoskeletal:  Normal range of motion. He exhibits no edema or tenderness.  Lymphadenopathy:    He has no cervical adenopathy.  Neurological: He is alert and oriented to person, place, and time.  Skin: Skin is warm and dry. He is not diaphoretic.  Psychiatric: He has a normal mood and affect. Cognition and memory are impaired.  Patient oriented to person only.    Labs reviewed: Basic Metabolic Panel:  Recent Labs  03/22/16 0839 10/24/16 0946 01/29/17 0014  NA 137 136 135  K 4.4 3.5 3.5  CL 105 106 104  CO2 24 18* 21*  GLUCOSE 100* 89 140*  BUN 8 5* 7  CREATININE 0.72 1.08 0.75  CALCIUM 8.7 9.3 9.2   Liver Function Tests:  Recent  Labs  03/22/16 0839 10/24/16 0946 01/29/17 0014  AST 41* 24 21  ALT 40 31 26  ALKPHOS 103 74 61  BILITOT 0.8 1.6* 2.3*  PROT 7.4 7.5 7.5  ALBUMIN 3.6 3.9 3.6    Recent Labs  10/24/16 0946 01/29/17 0014  LIPASE 22 24   No results for input(s): AMMONIA in the last 8760 hours. CBC:  Recent Labs  03/22/16 0839 10/24/16 0946 01/29/17 0014  WBC 6.5 6.3 8.8  NEUTROABS 2,990 4.5  --   HGB 14.4 15.4 15.4  HCT 43.2 43.7 44.1  MCV 99.1 96.7 96.3  PLT 272 182 167   Lipid Panel: No results for input(s): CHOL, HDL, LDLCALC, TRIG, CHOLHDL, LDLDIRECT in the last 8760 hours. TSH: No results for input(s): TSH in the last 8760 hours. A1C: No results found for: HGBA1C   Assessment/Plan 1. Loss of weight Instructions given for bland diet and to slowly reintroduce solid foods.  - H. pylori breath test- not taking omeprazole at this time.  - Ambulatory referral to Gastroenterology -has Rx for remeron but does not appear to be taken, encouraged to start this.   2. Abdominal pain, unspecified abdominal location Possible SBO found during CT, positive BS with diarrhea, also with right inguinal hernia noted but no tenderness in this area. Referral to GI specialist for abdominal pain, diarrhea, and loss of appetite. - H. pylori breath test - CMP with  eGFR - CBC with Differential/Platelets - Ambulatory referral to Gastroenterology  3. Nausea -without vomiting.  -h pylori test -bland diet, advance as tolerated  Next appt: strict precautions given as to when to seek medical attention via ED, to follow up in 2 weeks.  Carlos American. Harle Battiest  [facility] [phone] 8 am - 5 pm) [phone] (after hours)

## 2017-02-03 NOTE — Patient Instructions (Addendum)
Bland diet, advance as tolerates  To take Remeron at bedtime- this helps with appetite.   Limit greasy food  To go to the emergency room if unable to tolerate food or liquid by mouth, vomiting,  fever occurs or worsening of abdominal pain.

## 2017-02-04 ENCOUNTER — Other Ambulatory Visit: Payer: Self-pay | Admitting: Nurse Practitioner

## 2017-02-04 DIAGNOSIS — A048 Other specified bacterial intestinal infections: Secondary | ICD-10-CM

## 2017-02-04 LAB — CBC WITH DIFFERENTIAL/PLATELET
Basophils Absolute: 53 cells/uL (ref 0–200)
Basophils Relative: 0.7 %
Eosinophils Absolute: 38 cells/uL (ref 15–500)
Eosinophils Relative: 0.5 %
HCT: 46.3 % (ref 38.5–50.0)
Hemoglobin: 15.9 g/dL (ref 13.2–17.1)
Lymphs Abs: 1958 cells/uL (ref 850–3900)
MCH: 32.8 pg (ref 27.0–33.0)
MCHC: 34.3 g/dL (ref 32.0–36.0)
MCV: 95.5 fL (ref 80.0–100.0)
MPV: 11.1 fL (ref 7.5–12.5)
Monocytes Relative: 9.7 %
Neutro Abs: 4725 cells/uL (ref 1500–7800)
Neutrophils Relative %: 63 %
Platelets: 162 10*3/uL (ref 140–400)
RBC: 4.85 10*6/uL (ref 4.20–5.80)
RDW: 11.5 % (ref 11.0–15.0)
Total Lymphocyte: 26.1 %
WBC mixed population: 728 cells/uL (ref 200–950)
WBC: 7.5 10*3/uL (ref 3.8–10.8)

## 2017-02-04 LAB — COMPLETE METABOLIC PANEL WITH GFR
AG Ratio: 1.3 (calc) (ref 1.0–2.5)
ALT: 37 U/L (ref 9–46)
AST: 27 U/L (ref 10–35)
Albumin: 4.3 g/dL (ref 3.6–5.1)
Alkaline phosphatase (APISO): 64 U/L (ref 40–115)
BUN: 12 mg/dL (ref 7–25)
CO2: 32 mmol/L (ref 20–32)
Calcium: 9.9 mg/dL (ref 8.6–10.3)
Chloride: 97 mmol/L — ABNORMAL LOW (ref 98–110)
Creat: 0.84 mg/dL (ref 0.70–1.18)
GFR, Est African American: 101 mL/min/{1.73_m2} (ref >=60)
GFR, Est Non African American: 87 mL/min/{1.73_m2} (ref >=60)
Globulin: 3.3 g/dL (calc) (ref 1.9–3.7)
Glucose, Bld: 105 mg/dL — ABNORMAL HIGH (ref 65–99)
Potassium: 3.4 mmol/L — ABNORMAL LOW (ref 3.5–5.3)
Sodium: 136 mmol/L (ref 135–146)
Total Bilirubin: 2.1 mg/dL — ABNORMAL HIGH (ref 0.2–1.2)
Total Protein: 7.6 g/dL (ref 6.1–8.1)

## 2017-02-04 LAB — H. PYLORI BREATH TEST: H. pylori Breath Test: DETECTED — AB

## 2017-02-04 MED ORDER — AMOXICILL-CLARITHRO-LANSOPRAZ PO MISC: Freq: Two times a day (BID) | ORAL | 0 refills | Status: DC

## 2017-02-05 ENCOUNTER — Encounter: Payer: Self-pay | Admitting: Nurse Practitioner

## 2017-02-17 ENCOUNTER — Encounter: Payer: Self-pay | Admitting: Nurse Practitioner

## 2017-02-17 ENCOUNTER — Ambulatory Visit (INDEPENDENT_AMBULATORY_CARE_PROVIDER_SITE_OTHER): Payer: PPO | Admitting: Nurse Practitioner

## 2017-02-17 VITALS — BP 134/74 | HR 74 | Temp 98.4°F | Resp 17 | Ht 66.0 in | Wt 137.0 lb

## 2017-02-17 DIAGNOSIS — Z23 Encounter for immunization: Secondary | ICD-10-CM

## 2017-02-17 DIAGNOSIS — R634 Abnormal weight loss: Secondary | ICD-10-CM

## 2017-02-17 DIAGNOSIS — A048 Other specified bacterial intestinal infections: Secondary | ICD-10-CM | POA: Diagnosis not present

## 2017-02-17 NOTE — Progress Notes (Signed)
Careteam: Patient Care Team: Gayland Curry, DO as PCP - General (Geriatric Medicine) Gayland Curry, DO (Geriatric Medicine)  Advanced Directive information Does Patient Have a Medical Advance Directive?: No, Would patient like information on creating a medical advance directive?: No - Patient declined  No Known Allergies  Chief Complaint  Patient presents with  . Follow-up    Pt is being seen for a 2 week follow up on abdominal pain. Pt reports that he still has some gas pain but it is not severe.   . Immunizations    Pt wants flu vaccine today.     HPI: Patient is a 71 y.o. male seen in the office today to follow up abdominal pain. Pt was diagnosised with H pylori at last visit due to severe abdominal pain and not being able to tolerate POs. Today he reports he is doing much better. Denies pain. States he is able to eat much better. Weight is up at this time.  Moving bowels normal.  Has not completed prevpac at this time but reports he is taking it as prescribed. Unsure how much he has left.  Review of Systems:  Review of Systems  Constitutional: Negative for chills, fever and weight loss (weight gain).  Respiratory: Negative for cough and shortness of breath.   Cardiovascular: Negative for chest pain and palpitations.  Gastrointestinal: Negative for abdominal pain, blood in stool, diarrhea, nausea and vomiting.  Musculoskeletal: Negative for myalgias.  Neurological: Negative for dizziness and focal weakness.    Past Medical History:  Diagnosis Date  . Allergy   . Chronic hepatitis C without mention of hepatic coma   . H. pylori infection   . Hearing loss of aging   . Internal hemorrhoids without mention of complication   . Lipoma of unspecified site   . Loss of weight   . Mild cognitive impairment, so stated   . Osteoarthrosis, unspecified whether generalized or localized, unspecified site   . Pain in joint, forearm   . Personal history of colonic adenoma  06/25/2012  . Rash and other nonspecific skin eruption   . Routine general medical examination at a health care facility   . Substance abuse ([place])   . Tinnitus of both ears   . Tobacco use disorder   . Unspecified visual loss    Past Surgical History:  Procedure Laterality Date  . COLONOSCOPY    . left shoulder  1990   Social History:   reports that he has been smoking cigarettes.  He has a 40.00 pack-year smoking history. he has never used smokeless tobacco. He reports that he drinks about 10.8 oz of alcohol per week. He reports that he uses drugs. Drug: Marijuana. Frequency: 5.00 times per week.  Family History  Problem Relation Age of Onset  . Cancer Mother   . Stroke Sister   . Breast cancer Sister   . Colitis Neg Hx   . Esophageal cancer Neg Hx   . Stomach cancer Neg Hx   . Rectal cancer Neg Hx     Medications:   Medication List        Accurate as of 02/17/17 10:18 AM. Always use your most recent med list.          amoxicillin-clarithromycin-lansoprazole combo pack Commonly known as:  PREVPAC Take by mouth 2 (two) times daily. Follow package directions.   docusate sodium 100 MG capsule Commonly known as:  COLACE Take 1 capsule (100 mg total) by mouth every 12 (  twelve) hours.   gabapentin 300 MG capsule Commonly known as:  NEURONTIN TAKE TWO CAPSULES BY MOUTH THREE TIMES DAILY FOR NECK PAIN AND NUMBNESS OF RIGHT ARM   HYDROcodone-acetaminophen 5-325 MG tablet Commonly known as:  NORCO/VICODIN Take 1-2 tablets by mouth every 6 (six) hours as needed.   mirtazapine 15 MG tablet Commonly known as:  REMERON Take 1 tablet (15 mg total) by mouth at bedtime.   omeprazole 20 MG capsule Commonly known as:  PRILOSEC   ondansetron 4 MG disintegrating tablet Commonly known as:  ZOFRAN ODT Take 1 tablet (4 mg total) by mouth every 8 (eight) hours as needed for nausea or vomiting.        Physical Exam:  Vitals:   02/17/17 1014  BP: 134/74  Pulse: 74  Resp:  17  Temp: 98.4 F (36.9 C)  TempSrc: Oral  SpO2: 98%  Weight: 137 lb (62.1 kg)  Height: 5\' 6"  (1.676 m)   Body mass index is 22.11 kg/m.  Physical Exam  Constitutional: He is oriented to person, place, and time. He appears well-developed and well-nourished. No distress.  HENT:  Head: Normocephalic and atraumatic.  Mouth/Throat: Oropharynx is clear and moist. No oropharyngeal exudate.  Poor dentition   Eyes: Conjunctivae and EOM are normal. Pupils are equal, round, and reactive to light.  Cardiovascular: Normal rate, regular rhythm and normal heart sounds.  Pulmonary/Chest: Effort normal and breath sounds normal.  Abdominal: Soft. Bowel sounds are normal. He exhibits no distension and no mass. There is no tenderness. There is no rebound and no guarding.  Musculoskeletal: He exhibits no edema or tenderness.  Neurological: He is alert and oriented to person, place, and time.  Skin: Skin is warm and dry. He is not diaphoretic.  Psychiatric: He has a normal mood and affect. Cognition and memory are impaired.   Labs reviewed: Basic Metabolic Panel: Recent Labs    10/24/16 0946 01/29/17 0014 02/03/17 0150  NA 136 135 136  K 3.5 3.5 3.4*  CL 106 104 97*  CO2 18* 21* 32  GLUCOSE 89 140* 105*  BUN 5* 7 12  CREATININE 1.08 0.75 0.84  CALCIUM 9.3 9.2 9.9   Liver Function Tests: Recent Labs    03/22/16 0839 10/24/16 0946 01/29/17 0014 02/03/17 0150  AST 41* 24 21 27   ALT 40 31 26 37  ALKPHOS 103 74 61  --   BILITOT 0.8 1.6* 2.3* 2.1*  PROT 7.4 7.5 7.5 7.6  ALBUMIN 3.6 3.9 3.6  --    Recent Labs    10/24/16 0946 01/29/17 0014  LIPASE 22 24   No results for input(s): AMMONIA in the last 8760 hours. CBC: Recent Labs    03/22/16 0839 10/24/16 0946 01/29/17 0014 02/03/17 0150  WBC 6.5 6.3 8.8 7.5  NEUTROABS 2,990 4.5  --  4,725  HGB 14.4 15.4 15.4 15.9  HCT 43.2 43.7 44.1 46.3  MCV 99.1 96.7 96.3 95.5  PLT 272 182 167 162   Lipid Panel: No results for  input(s): CHOL, HDL, LDLCALC, TRIG, CHOLHDL, LDLDIRECT in the last 8760 hours. TSH: No results for input(s): TSH in the last 8760 hours. A1C: No results found for: HGBA1C   Assessment/Plan 1. Loss of weight Weight gain noted at time, report increase appetite. Will cont to monitor conts on Remeron.   2. H. pylori infection Prevpac given for h pylori, pt reports compliance with medication. To complete course at this time.   3. Flu vaccine given today  Next appt: 3 months  Jessica K. Harle Battiest  [facility] [phone] 8 am - 5 pm) [phone] (after hours)

## 2017-02-17 NOTE — Patient Instructions (Signed)
Cont all of your medication as prescribed.

## 2017-02-17 NOTE — Addendum Note (Signed)
Addended by: Denyse Amass on: 02/17/2017 10:42 AM   Modules accepted: Orders

## 2017-04-07 ENCOUNTER — Encounter: Payer: Self-pay | Admitting: Internal Medicine

## 2017-04-07 ENCOUNTER — Ambulatory Visit (INDEPENDENT_AMBULATORY_CARE_PROVIDER_SITE_OTHER): Payer: PPO | Admitting: Internal Medicine

## 2017-04-07 VITALS — BP 118/74 | HR 72 | Ht 65.0 in | Wt 133.0 lb

## 2017-04-07 DIAGNOSIS — K4090 Unilateral inguinal hernia, without obstruction or gangrene, not specified as recurrent: Secondary | ICD-10-CM | POA: Diagnosis not present

## 2017-04-07 DIAGNOSIS — A048 Other specified bacterial intestinal infections: Secondary | ICD-10-CM | POA: Diagnosis not present

## 2017-04-07 NOTE — Progress Notes (Signed)
Ryan Ortega 71 y.o. [birth year] [id number]  Assessment & Plan:   Encounter Diagnoses  Name Primary?  . H. pylori infection Yes  . Right inguinal hernia    He seems significantly improved since detection and treatment of his H. pylori infection though it is clear he did not take the complete course of Prevpac as he has 1 or 2 daily doses remaining.  I am recommending he repeat testing for H. pylori.  It sounds like that can be done in his primary care office with a breath test.  I reviewed problems with a hernia, should he have signs of incarceration he was advised to go to the emergency department.  He says he will ask primary care for referral about this.  He has a history of hepatitis C listed in the chart but I do not know what the status of that is.  If he does have active hepatitis C it would be reasonable to refer him to the infectious disease clinic to be considered for treatment.  I will communicate with primary care.  His liver looks okay on imaging and there do not seem to be other signs and labs of significant liver dysfunction, fortunately.  I will see him back as needed, he will be due for a colonoscopy on a routine basis next year.   Subjective:   Chief Complaint: Weight loss and abdominal pain  HPI The patient is here with his wife for evaluation of right upper quadrant pain and weight loss.  The patient presented to primary care in July of this year complaining of right upper quadrant pain and weight loss, he was actually then sent and evaluated in the emergency department with a CT scan that were unrevealing.  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  He was seen off and on and then in October had a CT Angie of the chest abdomen pelvis for questionable dissection because of right upper quadrant pain nausea and chest pain back pain and fever.  That was negative except for a right inguinal hernia that was new.  He did have renal cysts,  atherosclerosis mucus impaction and left lower lobe bronchi and some calcified hilar lymph nodes.  He then got tested for H. pylori in the office with a breath test, was treated with a Prevpac and since then the pain is gone and he has gained weight.  He does have 1 daily dose card of the Prevpac in his pocket today that he shows me and apparently one more at home so he did not take the whole Prevpac.  I do not think he is taking omeprazole which is on his med list.  It sounds like he does take his gabapentin and that is about it.  Social history indicates that he continues to drink alcohol.  There is a reported history of hepatitis C in the chart that I see after the patient has left, but I do not see any supporting documentation of that with lab tests.  Wt Readings from Last 3 Encounters:  04/07/17 133 lb (60.3 kg)  02/17/17 137 lb (62.1 kg)  02/03/17 123 lb (55.8 kg)    No Known Allergies Current Meds  Medication Sig  . amoxicillin-clarithromycin-lansoprazole (PREVPAC) combo pack Take by mouth 2 (two) times daily. Follow package directions.  . docusate sodium (COLACE) 100 MG capsule Take 1 capsule (100 mg total) by mouth every 12 (twelve) hours.  . gabapentin (NEURONTIN) 300 MG capsule TAKE TWO CAPSULES BY MOUTH THREE  TIMES DAILY FOR NECK PAIN AND NUMBNESS OF RIGHT ARM  . HYDROcodone-acetaminophen (NORCO/VICODIN) 5-325 MG tablet Take 1-2 tablets by mouth every 6 (six) hours as needed.  . mirtazapine (REMERON) 15 MG tablet Take 1 tablet (15 mg total) by mouth at bedtime.  Marland Kitchen omeprazole (PRILOSEC) 20 MG capsule Take 20 mg by mouth as needed.   . ondansetron (ZOFRAN ODT) 4 MG disintegrating tablet Take 1 tablet (4 mg total) by mouth every 8 (eight) hours as needed for nausea or vomiting. (Patient taking differently: Take 4 mg by mouth as needed for nausea or vomiting. )   Past Medical History:  Diagnosis Date  . Allergy   . Chronic hepatitis C without mention of hepatic coma   . H. pylori  infection   . Hearing loss of aging   . Internal hemorrhoids without mention of complication   . Lipoma of unspecified site   . Loss of weight   . Mild cognitive impairment, so stated   . Osteoarthrosis, unspecified whether generalized or localized, unspecified site   . Pain in joint, forearm   . Personal history of colonic adenoma 06/25/2012  . Rash and other nonspecific skin eruption   . Routine general medical examination at a health care facility   . Substance abuse ([place])   . Tinnitus of both ears   . Tobacco use disorder   . Unspecified visual loss    Past Surgical History:  Procedure Laterality Date  . COLONOSCOPY    . left shoulder  1990   Social History   Social History Narrative   He is married with 4 children   He is retired from concrete work   Drinks about 18 cans of beer a week   family history includes Breast cancer in his sister; Cancer in his mother; Stroke in his sister.   Review of Systems As per HPI  Objective:   Physical Exam  BP 118/74   Pulse 72   Ht 5\' 5"  (1.651 m)   Wt 133 lb (60.3 kg)   BMI 22.13 kg/m   Thin well-developed black man in no acute distress Eyes anicteric Lungs are clear Heart sounds are normal Abdomen is thin soft nontender no organomegaly or mass Extremities are without cyanosis clubbing or edema Affect somewhat flat, he appears alert and oriented   Data reviewed includes that mentioned in the HPI, labs CT scans primary care notes from 2018.

## 2017-04-07 NOTE — Patient Instructions (Signed)
Dr Carlean Purl is going to message your PCP about doing a breath test in their office.   Follow up with Dr Carlean Purl as needed.    I appreciate the opportunity to care for you. Silvano Rusk, MD, [facility]

## 2017-04-14 ENCOUNTER — Ambulatory Visit (INDEPENDENT_AMBULATORY_CARE_PROVIDER_SITE_OTHER): Payer: PPO

## 2017-04-14 VITALS — BP 148/66 | HR 98 | Temp 98.1°F | Ht 65.0 in | Wt 133.0 lb

## 2017-04-14 DIAGNOSIS — Z0000 Encounter for general adult medical examination without abnormal findings: Secondary | ICD-10-CM

## 2017-04-14 NOTE — Patient Instructions (Signed)
Mr. Ryan Ortega , Thank you for taking time to come for your Medicare Wellness Visit. I appreciate your ongoing commitment to your health goals. Please review the following plan we discussed and let me know if I can assist you in the future.   Screening recommendations/referrals: Colonoscopy up to date. Due 09/01/2022 Recommended yearly ophthalmology/optometry visit for glaucoma screening and checkup Recommended yearly dental visit for hygiene and checkup  Vaccinations: Influenza vaccine up to date. Due 2019 fall season Pneumococcal vaccine up to date Tdap vaccine up to date. Due 05/18/2023 Shingles vaccine due, declined  Advanced directives: Advance directive discussed with you today.You have a copy to complete at home and have notarized. Once this is complete please bring a copy in to our office so we can scan it into your chart.   Conditions/risks identified: None  Next appointment: Sherrie Mustache, NP 72/08/2017 @ 10:15 am             Theodoro Doing, RN 04/17/2018 @ 9:15am  Preventive Care 72 Years and Older, Male Preventive care refers to lifestyle choices and visits with your health care provider that can promote health and wellness. What does preventive care include?  A yearly physical exam. This is also called an annual well check.  Dental exams once or twice a year.  Routine eye exams. Ask your health care provider how often you should have your eyes checked.  Personal lifestyle choices, including:  Daily care of your teeth and gums.  Regular physical activity.  Eating a healthy diet.  Avoiding tobacco and drug use.  Limiting alcohol use.  Practicing safe sex.  Taking low doses of aspirin every day.  Taking vitamin and mineral supplements as recommended by your health care provider. What happens during an annual well check? The services and screenings done by your health care provider during your annual well check will depend on your age, overall health, lifestyle risk  factors, and family history of disease. Counseling  Your health care provider may ask you questions about your:  Alcohol use.  Tobacco use.  Drug use.  Emotional well-being.  Home and relationship well-being.  Sexual activity.  Eating habits.  History of falls.  Memory and ability to understand (cognition).  Work and work Statistician. Screening  You may have the following tests or measurements:  Height, weight, and BMI.  Blood pressure.  Lipid and cholesterol levels. These may be checked every 5 years, or more frequently if you are over 34 years old.  Skin check.  Lung cancer screening. You may have this screening every year starting at age 12 if you have a 30-pack-year history of smoking and currently smoke or have quit within the past 15 years.  Fecal occult blood test (FOBT) of the stool. You may have this test every year starting at age 27.  Flexible sigmoidoscopy or colonoscopy. You may have a sigmoidoscopy every 5 years or a colonoscopy every 10 years starting at age 34.  Prostate cancer screening. Recommendations will vary depending on your family history and other risks.  Hepatitis C blood test.  Hepatitis B blood test.  Sexually transmitted disease (STD) testing.  Diabetes screening. This is done by checking your blood sugar (glucose) after you have not eaten for a while (fasting). You may have this done every 1-3 years.  Abdominal aortic aneurysm (AAA) screening. You may need this if you are a current or former smoker.  Osteoporosis. You may be screened starting at age 62 if you are at high risk. Talk  with your health care provider about your test results, treatment options, and if necessary, the need for more tests. Vaccines  Your health care provider may recommend certain vaccines, such as:  Influenza vaccine. This is recommended every year.  Tetanus, diphtheria, and acellular pertussis (Tdap, Td) vaccine. You may need a Td booster every 10  years.  Zoster vaccine. You may need this after age 26.  Pneumococcal 13-valent conjugate (PCV13) vaccine. One dose is recommended after age 56.  Pneumococcal polysaccharide (PPSV23) vaccine. One dose is recommended after age 14. Talk to your health care provider about which screenings and vaccines you need and how often you need them. This information is not intended to replace advice given to you by your health care provider. Make sure you discuss any questions you have with your health care provider. Document Released: 04/28/2015 Document Revised: 12/20/2015 Document Reviewed: 01/31/2015 Elsevier Interactive Patient Education  2017 [place] Prevention in the Home Falls can cause injuries. They can happen to people of all ages. There are many things you can do to make your home safe and to help prevent falls. What can I do on the outside of my home?  Regularly fix the edges of walkways and driveways and fix any cracks.  Remove anything that might make you trip as you walk through a door, such as a raised step or threshold.  Trim any bushes or trees on the path to your home.  Use bright outdoor lighting.  Clear any walking paths of anything that might make someone trip, such as rocks or tools.  Regularly check to see if handrails are loose or broken. Make sure that both sides of any steps have handrails.  Any raised decks and porches should have guardrails on the edges.  Have any leaves, snow, or ice cleared regularly.  Use sand or salt on walking paths during winter.  Clean up any spills in your garage right away. This includes oil or grease spills. What can I do in the bathroom?  Use night lights.  Install grab bars by the toilet and in the tub and shower. Do not use towel bars as grab bars.  Use non-skid mats or decals in the tub or shower.  If you need to sit down in the shower, use a plastic, non-slip stool.  Keep the floor dry. Clean up any water that  spills on the floor as soon as it happens.  Remove soap buildup in the tub or shower regularly.  Attach bath mats securely with double-sided non-slip rug tape.  Do not have throw rugs and other things on the floor that can make you trip. What can I do in the bedroom?  Use night lights.  Make sure that you have a light by your bed that is easy to reach.  Do not use any sheets or blankets that are too big for your bed. They should not hang down onto the floor.  Have a firm chair that has side arms. You can use this for support while you get dressed.  Do not have throw rugs and other things on the floor that can make you trip. What can I do in the kitchen?  Clean up any spills right away.  Avoid walking on wet floors.  Keep items that you use a lot in easy-to-reach places.  If you need to reach something above you, use a strong step stool that has a grab bar.  Keep electrical cords out of the way.  Do  not use floor polish or wax that makes floors slippery. If you must use wax, use non-skid floor wax.  Do not have throw rugs and other things on the floor that can make you trip. What can I do with my stairs?  Do not leave any items on the stairs.  Make sure that there are handrails on both sides of the stairs and use them. Fix handrails that are broken or loose. Make sure that handrails are as long as the stairways.  Check any carpeting to make sure that it is firmly attached to the stairs. Fix any carpet that is loose or worn.  Avoid having throw rugs at the top or bottom of the stairs. If you do have throw rugs, attach them to the floor with carpet tape.  Make sure that you have a light switch at the top of the stairs and the bottom of the stairs. If you do not have them, ask someone to add them for you. What else can I do to help prevent falls?  Wear shoes that:  Do not have high heels.  Have rubber bottoms.  Are comfortable and fit you well.  Are closed at the  toe. Do not wear sandals.  If you use a stepladder:  Make sure that it is fully opened. Do not climb a closed stepladder.  Make sure that both sides of the stepladder are locked into place.  Ask someone to hold it for you, if possible.  Clearly mark and make sure that you can see:  Any grab bars or handrails.  First and last steps.  Where the edge of each step is.  Use tools that help you move around (mobility aids) if they are needed. These include:  Canes.  Walkers.  Scooters.  Crutches.  Turn on the lights when you go into a dark area. Replace any light bulbs as soon as they burn out.  Set up your furniture so you have a clear path. Avoid moving your furniture around.  If any of your floors are uneven, fix them.  If there are any pets around you, be aware of where they are.  Review your medicines with your doctor. Some medicines can make you feel dizzy. This can increase your chance of falling. Ask your doctor what other things that you can do to help prevent falls. This information is not intended to replace advice given to you by your health care provider. Make sure you discuss any questions you have with your health care provider. Document Released: 01/26/2009 Document Revised: 09/07/2015 Document Reviewed: 05/06/2014 Elsevier Interactive Patient Education  2017 [employer].

## 2017-04-14 NOTE — Progress Notes (Signed)
Subjective:   Ryan Ortega is a 71 y.o. male who presents for Medicare Annual/Subsequent preventive examination.       Objective:    Vitals: BP (!) 148/66 (BP Location: Right Arm, Patient Position: Sitting)   Pulse 98   Temp 98.1 F (36.7 C) (Oral)   Ht 5\' 5"  (1.651 m)   Wt 133 lb (60.3 kg)   SpO2 (!) 64%   BMI 22.13 kg/m   Body mass index is 22.13 kg/m.  Advanced Directives 04/14/2017 02/17/2017 01/29/2017 10/24/2016 10/24/2016 06/20/2016 04/12/2016  Does Patient Have a Medical Advance Directive? No No No No No No No  Would patient like information on creating a medical advance directive? No - Patient declined No - Patient declined No - Patient declined No - Patient declined No - Patient declined - No - Patient declined  Pre-existing out of facility DNR order (yellow form or pink MOST form) - - - - - - -    Tobacco Social History   Tobacco Use  Smoking Status Current Some Day Smoker  . Packs/day: 0.50  . Years: 40.00  . Pack years: 20.00  . Types: Cigarettes  Smokeless Tobacco Never Used     Ready to quit: Not Answered Counseling given: Not Answered   Clinical Intake:  Pre-visit preparation completed: No  Pain : No/denies pain     Nutritional Risks: None Diabetes: No  How often do you need to have someone help you when you read instructions, pamphlets, or other written materials from your doctor or pharmacy?: 2 - Rarely What is the last grade level you completed in school?: 6th grade  Interpreter Needed?: No  Information entered by :: Tyson Dense, RN  Past Medical History:  Diagnosis Date  . Allergy   . Chronic hepatitis C without mention of hepatic coma   . H. pylori infection   . Hearing loss of aging   . Internal hemorrhoids without mention of complication   . Lipoma of unspecified site   . Loss of weight   . Mild cognitive impairment, so stated   . Osteoarthrosis, unspecified whether generalized or localized, unspecified site   . Pain in  joint, forearm   . Personal history of colonic adenoma 06/25/2012  . Rash and other nonspecific skin eruption   . Routine general medical examination at a health care facility   . Substance abuse ([place])   . Tinnitus of both ears   . Tobacco use disorder   . Unspecified visual loss    Past Surgical History:  Procedure Laterality Date  . COLONOSCOPY    . left shoulder  1990   Family History  Problem Relation Age of Onset  . Cancer Mother   . Stroke Sister   . Breast cancer Sister   . Colitis Neg Hx   . Esophageal cancer Neg Hx   . Stomach cancer Neg Hx   . Rectal cancer Neg Hx    Social History   Socioeconomic History  . Marital status: Married    Spouse name: None  . Number of children: 4  . Years of education: None  . Highest education level: None  Social Needs  . Financial resource strain: Not hard at all  . Food insecurity - worry: Never true  . Food insecurity - inability: Never true  . Transportation needs - medical: No  . Transportation needs - non-medical: No  Occupational History  . Occupation: retired    Fish farm manager: Advertising copywriter  Tobacco Use  .  Smoking status: Current Some Day Smoker    Packs/day: 0.50    Years: 40.00    Pack years: 20.00    Types: Cigarettes  . Smokeless tobacco: Never Used  Substance and Sexual Activity  . Alcohol use: Yes    Alcohol/week: 10.8 oz    Types: 18 Cans of beer per week  . Drug use: Yes    Frequency: 5.0 times per week    Types: Marijuana    Comment: smokes marijuna "every now and then during the week"  . Sexual activity: Yes  Other Topics Concern  . None  Social History Narrative   He is married with 4 children   He is retired from concrete work   Drinks about 18 cans of beer a week   Admits to using some marijuana   He is an intermittent smoker   04/07/2017    Outpatient Encounter Medications as of 04/14/2017  Medication Sig  . docusate sodium (COLACE) 100 MG capsule Take 1 capsule (100 mg total) by mouth  every 12 (twelve) hours.  . gabapentin (NEURONTIN) 300 MG capsule TAKE TWO CAPSULES BY MOUTH THREE TIMES DAILY FOR NECK PAIN AND NUMBNESS OF RIGHT ARM  . HYDROcodone-acetaminophen (NORCO/VICODIN) 5-325 MG tablet Take 1-2 tablets by mouth every 6 (six) hours as needed.  . mirtazapine (REMERON) 15 MG tablet Take 1 tablet (15 mg total) by mouth at bedtime.  Marland Kitchen omeprazole (PRILOSEC) 20 MG capsule Take 20 mg by mouth as needed.   . ondansetron (ZOFRAN ODT) 4 MG disintegrating tablet Take 1 tablet (4 mg total) by mouth every 8 (eight) hours as needed for nausea or vomiting. (Patient taking differently: Take 4 mg by mouth as needed for nausea or vomiting. )  . [DISCONTINUED] amoxicillin-clarithromycin-lansoprazole (PREVPAC) combo pack Take by mouth 2 (two) times daily. Follow package directions.   No facility-administered encounter medications on file as of 04/14/2017.     Activities of Daily Living In your present state of health, do you have any difficulty performing the following activities: 04/14/2017  Hearing? N  Vision? N  Difficulty concentrating or making decisions? N  Walking or climbing stairs? N  Dressing or bathing? N  Doing errands, shopping? N  Preparing Food and eating ? N  Using the Toilet? N  In the past six months, have you accidently leaked urine? Y  Do you have problems with loss of bowel control? N  Managing your Medications? N  Managing your Finances? N  Housekeeping or managing your Housekeeping? N  Some recent data might be hidden    Patient Care Team: Gayland Curry, DO as PCP - General (Geriatric Medicine) Gayland Curry, DO (Geriatric Medicine)   Assessment:   This is a routine wellness examination for Jovanne.  Exercise Activities and Dietary recommendations Current Exercise Habits: The patient does not participate in regular exercise at present, Exercise limited by: None identified  Goals    . DIET - INCREASE WATER INTAKE     Patient will increase water  to 3-4 botles a day        Fall Risk Fall Risk  04/14/2017 02/17/2017 02/03/2017 06/20/2016 04/12/2016  Falls in the past year? No No No No No   Is the patient's home free of loose throw rugs in walkways, pet beds, electrical cords, etc?   yes      Grab bars in the bathroom? yes      Handrails on the stairs?   yes      Adequate lighting?  yes  Timed Get Up and Go Performed: 16 seconds, fall risk  Depression Screen PHQ 2/9 Scores 04/14/2017 06/20/2016 04/12/2016 03/22/2016  PHQ - 2 Score 0 0 0 0    Cognitive Function MMSE - Mini Mental State Exam 04/14/2017 04/12/2016  Not completed: - (No Data)  Orientation to time 5 5  Orientation to Place 4 4  Registration 3 3  Attention/ Calculation 0 0  Recall 0 3  Language- name 2 objects 2 2  Language- repeat 1 1  Language- follow 3 step command 2 2  Language- read & follow direction 0 0  Write a sentence 0 0  Copy design 0 0  Total score 17 20        Immunization History  Administered Date(s) Administered  . DTaP 07/08/1956  . Hepatitis A 09/05/2014  . Hepatitis B 09/05/2014  . Influenza, High Dose Seasonal PF 02/17/2017  . Influenza,inj,Quad PF,6+ Mos 03/24/2014, 03/17/2015, 12/21/2015  . Pneumococcal Conjugate-13 06/24/2014  . Pneumococcal Polysaccharide-23 07/07/2009, 08/03/2012  . Td 07/08/1951  . Tdap 05/17/2013  . Varicella 07/07/1957  . Zoster 07/07/2004    Qualifies for Shingles Vaccine? Yes, educated and declined  Screening Tests Health Maintenance  Topic Date Due  . COLONOSCOPY  08/31/2017  . TETANUS/TDAP  05/18/2023  . INFLUENZA VACCINE  Completed  . Hepatitis C Screening  Completed  . PNA vac Low Risk Adult  Completed   Cancer Screenings: Lung: Low Dose CT Chest recommended if Age 72-80 years, 30 pack-year currently smoking OR have quit w/in 15years. Patient does qualify. Colorectal: up to date  Additional Screenings:  Hepatitis B/HIV/Syphillis:Not indictaed Hepatitis C Screening: Not indicated     Plan:    I have personally reviewed and addressed the Medicare Annual Wellness questionnaire and have noted the following in the patient's chart:  A. Medical and social history B. Use of alcohol, tobacco or illicit drugs  C. Current medications and supplements D. Functional ability and status E.  Nutritional status F.  Physical activity G. Advance directives H. List of other physicians I.  Hospitalizations, surgeries, and ER visits in previous 12 months J.  Hope to include hearing, vision, cognitive, depression L. Referrals and appointments - none  In addition, I have reviewed and discussed with patient certain preventive protocols, quality metrics, and best practice recommendations. A written personalized care plan for preventive services as well as general preventive health recommendations were provided to patient.  See attached scanned questionnaire for additional information.   Signed,   Tyson Dense, RN Nurse Health Advisor   Quick Notes   Health Maintenance: Shingrix due and declined     Abnormal Screen: MMSE 17/30. Did not pass clock drawing. Pt has 6th grade education level 1st BP 162/70 2nd BP 148/66    Patient Concerns: None     Nurse Concerns: None

## 2017-05-20 ENCOUNTER — Ambulatory Visit (INDEPENDENT_AMBULATORY_CARE_PROVIDER_SITE_OTHER): Payer: Medicare Other | Admitting: Nurse Practitioner

## 2017-05-20 ENCOUNTER — Encounter: Payer: Self-pay | Admitting: Nurse Practitioner

## 2017-05-20 ENCOUNTER — Other Ambulatory Visit: Payer: Self-pay

## 2017-05-20 VITALS — BP 128/72 | HR 73 | Temp 98.3°F | Ht 65.0 in | Wt 135.0 lb

## 2017-05-20 DIAGNOSIS — Z720 Tobacco use: Secondary | ICD-10-CM | POA: Diagnosis not present

## 2017-05-20 DIAGNOSIS — Z13220 Encounter for screening for lipoid disorders: Secondary | ICD-10-CM

## 2017-05-20 DIAGNOSIS — A048 Other specified bacterial intestinal infections: Secondary | ICD-10-CM

## 2017-05-20 DIAGNOSIS — M4802 Spinal stenosis, cervical region: Secondary | ICD-10-CM

## 2017-05-20 DIAGNOSIS — G992 Myelopathy in diseases classified elsewhere: Secondary | ICD-10-CM

## 2017-05-20 DIAGNOSIS — B182 Chronic viral hepatitis C: Secondary | ICD-10-CM

## 2017-05-20 DIAGNOSIS — M4712 Other spondylosis with myelopathy, cervical region: Secondary | ICD-10-CM

## 2017-05-20 NOTE — Progress Notes (Addendum)
Careteam: Patient Care Team: Gayland Curry, DO as PCP - General (Geriatric Medicine) Gayland Curry, DO (Geriatric Medicine)  Advanced Directive information Does Patient Have a Medical Advance Directive?: No, Would patient like information on creating a medical advance directive?: No - Patient declined  No Known Allergies  Chief Complaint  Patient presents with  . Medical Management of Chronic Issues    3 month follow up, patient with ongoing right side abdominal pain  . Medication Refill    Hydrocodone   . Best Practice Recommendations    Discuss need for Hep C screening, pateint had in 2017      HPI: Patient is a 72 y.o. male seen in the office today for routine follow up.  Did not complete treatment for H. Pylori- unsure when he took omeprazole last but states he has ran out of medication ?month ago. Occasional GERD but not bad enough to take medication for.   appetite is okay, comes and goes.   Reports right side abdominal pain that comes and goes, reports lots of gas, when he rubs it pain will go away. Happens about twice a week last 5-10 mins. Reports pain "is not too bad" unable to describe pain other than gas.   No nausea- not using zofran at this time.  Continues to smoke, 1 pack in 3 days. States he is trying to quit a little at a time. Does not want information on quitting, states "I will quit on my own."    neck pain- using hydrocodone only for neck pain also not taking gabapentin- no constipation regarding this. Pain is controlled using 1  Tablet twice daily (last refill was 01/29/17 for 15 tablets)   Smoking mariajuana and drinking beer about once a week with his friends.aware he is not supposed to be using this with pain medication.    Reports he only takes pills for his neck every day- not taking remeron -"does not have none" "when they ran out they ran out" did not know he was supposed to refill it.  The last time he took remeron was "a long time ago"  never got any refills on this. Weight has been stable since treated for h pylori    Review of Systems:  Review of Systems  Constitutional: Negative for chills, fever and weight loss (weight gain).  Respiratory: Negative for cough and shortness of breath.   Cardiovascular: Negative for chest pain, palpitations and leg swelling.  Gastrointestinal: Negative for abdominal pain, blood in stool, diarrhea, nausea and vomiting.  Musculoskeletal: Positive for myalgias and neck pain.  Neurological: Negative for dizziness and focal weakness.  Psychiatric/Behavioral: Negative for depression and memory loss. The patient is not nervous/anxious and does not have insomnia.     Past Medical History:  Diagnosis Date  . Allergy   . Chronic hepatitis C without mention of hepatic coma   . H. pylori infection   . Hearing loss of aging   . Internal hemorrhoids without mention of complication   . Lipoma of unspecified site   . Loss of weight   . Mild cognitive impairment, so stated   . Osteoarthrosis, unspecified whether generalized or localized, unspecified site   . Pain in joint, forearm   . Personal history of colonic adenoma 06/25/2012  . Rash and other nonspecific skin eruption   . Routine general medical examination at a health care facility   . Substance abuse ([place])   . Tinnitus of both ears   . Tobacco  use disorder   . Unspecified visual loss    Past Surgical History:  Procedure Laterality Date  . COLONOSCOPY    . left shoulder  1990   Social History:   reports that he has been smoking cigarettes.  He has a 20.00 pack-year smoking history. he has never used smokeless tobacco. He reports that he drinks about 10.8 oz of alcohol per week. He reports that he uses drugs. Drug: Marijuana. Frequency: 5.00 times per week.  Family History  Problem Relation Age of Onset  . Cancer Mother   . Stroke Sister   . Breast cancer Sister   . Colitis Neg Hx   . Esophageal cancer Neg Hx   . Stomach cancer  Neg Hx   . Rectal cancer Neg Hx     Medications: Patient's Medications  New Prescriptions   No medications on file  Previous Medications   DOCUSATE SODIUM (COLACE) 100 MG CAPSULE    Take 1 capsule (100 mg total) by mouth every 12 (twelve) hours.   GABAPENTIN (NEURONTIN) 300 MG CAPSULE    TAKE TWO CAPSULES BY MOUTH THREE TIMES DAILY FOR NECK PAIN AND NUMBNESS OF RIGHT ARM   HYDROCODONE-ACETAMINOPHEN (NORCO/VICODIN) 5-325 MG TABLET    Take 1-2 tablets by mouth every 6 (six) hours as needed.   MIRTAZAPINE (REMERON) 15 MG TABLET    Take 1 tablet (15 mg total) by mouth at bedtime.   OMEPRAZOLE (PRILOSEC) 20 MG CAPSULE    Take 20 mg by mouth as needed.    ONDANSETRON (ZOFRAN ODT) 4 MG DISINTEGRATING TABLET    Take 1 tablet (4 mg total) by mouth every 8 (eight) hours as needed for nausea or vomiting.  Modified Medications   No medications on file  Discontinued Medications   No medications on file     Physical Exam:  Vitals:   05/20/17 1004  BP: 128/72  Pulse: 73  Temp: 98.3 F (36.8 C)  TempSrc: Oral  SpO2: 93%  Weight: 135 lb (61.2 kg)  Height: 5\' 5"  (1.651 m)   Body mass index is 22.47 kg/m.  Physical Exam  Constitutional: He is oriented to person, place, and time. He appears well-developed and well-nourished. No distress.  HENT:  Head: Normocephalic and atraumatic.  Mouth/Throat: Oropharynx is clear and moist. No oropharyngeal exudate.  Poor dentition   Eyes: Conjunctivae and EOM are normal. Pupils are equal, round, and reactive to light.  Neck: Normal range of motion. Neck supple.  Cardiovascular: Normal rate, regular rhythm and normal heart sounds.  Pulmonary/Chest: Effort normal and breath sounds normal.  Abdominal: Soft. Bowel sounds are normal. He exhibits no distension and no mass. There is no tenderness. There is no rebound and no guarding.  Musculoskeletal: He exhibits tenderness (cervical spine). He exhibits no edema.  Neurological: He is alert and oriented to  person, place, and time.  Skin: Skin is warm and dry. He is not diaphoretic.  Psychiatric: He has a normal mood and affect. Cognition and memory are impaired.    Labs reviewed: Basic Metabolic Panel: Recent Labs    10/24/16 0946 01/29/17 0014 02/03/17 0150  NA 136 135 136  K 3.5 3.5 3.4*  CL 106 104 97*  CO2 18* 21* 32  GLUCOSE 89 140* 105*  BUN 5* 7 12  CREATININE 1.08 0.75 0.84  CALCIUM 9.3 9.2 9.9   Liver Function Tests: Recent Labs    10/24/16 0946 01/29/17 0014 02/03/17 0150  AST 24 21 27   ALT 31 26 37  ALKPHOS 74 61  --   BILITOT 1.6* 2.3* 2.1*  PROT 7.5 7.5 7.6  ALBUMIN 3.9 3.6  --    Recent Labs    10/24/16 0946 01/29/17 0014  LIPASE 22 24   No results for input(s): AMMONIA in the last 8760 hours. CBC: Recent Labs    10/24/16 0946 01/29/17 0014 02/03/17 0150  WBC 6.3 8.8 7.5  NEUTROABS 4.5  --  4,725  HGB 15.4 15.4 15.9  HCT 43.7 44.1 46.3  MCV 96.7 96.3 95.5  PLT 182 167 162   Lipid Panel: No results for input(s): CHOL, HDL, LDLCALC, TRIG, CHOLHDL, LDLDIRECT in the last 8760 hours. TSH: No results for input(s): TSH in the last 8760 hours. A1C: No results found for: HGBA1C   Assessment/Plan 1. H. pylori infection -did not complete whole course but weight is stable- gained since original diagnosis and abdominal pain has improved.  - H. pylori breath test - COMPLETE METABOLIC PANEL WITH GFR  2. Stenosis of cervical spine with myelopathy -states he is taking hydrocodone-apap only however this was prescribed in October for #15, states he is not  using gabapentin, therefore removed from medication list. However after office visit we called his home and wife states he IS taking gabapentin and NOT taking hydrocodone-apap; Also educated pt that he should not be smoking mariajuana/drinking ETOH and taking narcotics   3. Screening, lipid -eats "whatever"  - COMPLETE METABOLIC PANEL WITH GFR - Lipid Panel  4. Tobacco abuse -encouraged  cessation, states he is cutting back a little at a time  5. Chronic hepatitis C without hepatic coma (HCC) -unsure of status, will follow up lab, may need referral to ID.  - Hep C RNA, Quant  6. Weight loss -stable after H Pylori treatment (even though he did not take entire course); pt has not been on remeron since initial Rx, states he never got refill, "did not even think about it" weight has been stable therefore Remeron removed from medication list.   Next appt: 4 months with Dr Mariea Clonts (PCP) for routine follow up  [place]. Harle Battiest  [facility] [phone] 8 am - 5 pm) [phone] (after hours)

## 2017-05-20 NOTE — Telephone Encounter (Signed)
Patient aware medication was denied and informed of refusal reason.  Ryan Ortega stated patient told her he was taking something twice daily and if not the hydrocodone then it must be one of the medications removed from his list earlier

## 2017-05-20 NOTE — Telephone Encounter (Signed)
Patient was in office for 3 month follow-up with Sherrie Mustache, NP and requested refill on Hydrocodone. Per Janett Billow send to PCP due to current substance use    Last filled 01/29/2017, I tried several time to look up patient on [facility] and was unable to access his account.

## 2017-05-21 LAB — COMPLETE METABOLIC PANEL WITH GFR
AG Ratio: 1.2 (calc) (ref 1.0–2.5)
ALT: 53 U/L — ABNORMAL HIGH (ref 9–46)
AST: 49 U/L — ABNORMAL HIGH (ref 10–35)
Albumin: 4.1 g/dL (ref 3.6–5.1)
Alkaline phosphatase (APISO): 73 U/L (ref 40–115)
BUN: 10 mg/dL (ref 7–25)
CO2: 24 mmol/L (ref 20–32)
Calcium: 9 mg/dL (ref 8.6–10.3)
Chloride: 106 mmol/L (ref 98–110)
Creat: 0.74 mg/dL (ref 0.70–1.18)
GFR, Est African American: 107 mL/min/{1.73_m2} (ref >=60)
GFR, Est Non African American: 92 mL/min/{1.73_m2} (ref >=60)
Globulin: 3.4 g/dL (calc) (ref 1.9–3.7)
Glucose, Bld: 99 mg/dL (ref 65–99)
Potassium: 3.9 mmol/L (ref 3.5–5.3)
Sodium: 136 mmol/L (ref 135–146)
Total Bilirubin: 2.4 mg/dL — ABNORMAL HIGH (ref 0.2–1.2)
Total Protein: 7.5 g/dL (ref 6.1–8.1)

## 2017-05-21 LAB — LIPID PANEL
Cholesterol: 109 mg/dL (ref <200)
HDL: 62 mg/dL (ref >40)
LDL Cholesterol (Calc): 33 mg/dL (calc)
Non-HDL Cholesterol (Calc): 47 mg/dL (calc) (ref <130)
Total CHOL/HDL Ratio: 1.8 (calc) (ref <5.0)
Triglycerides: 60 mg/dL (ref <150)

## 2017-05-21 LAB — HEPATITIS C RNA QUANTITATIVE
HCV Quantitative Log: 6.58 Log IU/mL — ABNORMAL HIGH
HCV RNA, PCR, QN: 3830000 IU/mL — ABNORMAL HIGH

## 2017-05-21 LAB — H. PYLORI BREATH TEST: H. pylori Breath Test: NOT DETECTED

## 2017-05-22 ENCOUNTER — Other Ambulatory Visit: Payer: Self-pay | Admitting: Nurse Practitioner

## 2017-05-22 DIAGNOSIS — B182 Chronic viral hepatitis C: Secondary | ICD-10-CM

## 2017-07-08 ENCOUNTER — Ambulatory Visit (INDEPENDENT_AMBULATORY_CARE_PROVIDER_SITE_OTHER): Payer: Medicare Other | Admitting: Internal Medicine

## 2017-07-08 ENCOUNTER — Encounter: Payer: Self-pay | Admitting: Internal Medicine

## 2017-07-08 VITALS — BP 143/83 | HR 76 | Temp 97.8°F | Ht 66.0 in | Wt 128.0 lb

## 2017-07-08 DIAGNOSIS — B182 Chronic viral hepatitis C: Secondary | ICD-10-CM | POA: Diagnosis not present

## 2017-07-08 NOTE — Progress Notes (Signed)
[place] for Infectious Disease   CC: consideration for treatment for chronic hepatitis C  HPI:  +Ryan Ortega is a 72 y.o. male who presents for initial evaluation and management of chronic hepatitis C.  Patient tested positive earlier this year during routine screening. Hepatitis C-associated risk factors present are: none. Patient denies history of blood transfusion, IV drug abuse, multiple sexual partners, sexual contact with person with liver disease, tattoos. Patient has had other studies performed. Results: hepatitis C RNA by PCR, result: positive. Patient has not had prior treatment for Hepatitis C. Patient does not have a past history of liver disease. Patient does not have a family history of liver disease. Patient does not  have associated signs or symptoms related to liver disease.  Labs reviewed and confirm chronic hepatitis C with a positive viral load.   Records reviewed from Epic and recent test with positive RNA.   No other positive tests in Care Everywhere CT scan last year with no suggestion of cirrhosis.       Patient does not have documented immunity to Hepatitis A. Patient does not have documented immunity to Hepatitis B.    Review of Systems:  Constitutional: negative for fatigue and malaise Gastrointestinal: negative for diarrhea Musculoskeletal: negative for myalgias and arthralgias All other systems reviewed and are negative       Past Medical History:  Diagnosis Date  . Allergy   . Chronic hepatitis C without mention of hepatic coma   . H. pylori infection   . Hearing loss of aging   . Internal hemorrhoids without mention of complication   . Lipoma of unspecified site   . Loss of weight   . Mild cognitive impairment, so stated   . Osteoarthrosis, unspecified whether generalized or localized, unspecified site   . Pain in joint, forearm   . Personal history of colonic adenoma 06/25/2012  . Rash and other nonspecific skin eruption   . Routine  general medical examination at a health care facility   . Substance abuse ([place])   . Tinnitus of both ears   . Tobacco use disorder   . Unspecified visual loss     Prior to Admission medications   Medication Sig Start Date End Date Taking? Authorizing Provider  HYDROcodone-acetaminophen (NORCO/VICODIN) 5-325 MG tablet Take 1-2 tablets by mouth every 6 (six) hours as needed. 01/29/17   Ward, Delice Bison, DO    No Known Allergies  Social History   Tobacco Use  . Smoking status: Current Some Day Smoker    Packs/day: 0.50    Years: 40.00    Pack years: 20.00    Types: Cigarettes  . Smokeless tobacco: Never Used  Substance Use Topics  . Alcohol use: Yes    Alcohol/week: 10.8 oz    Types: 18 Cans of beer per week  . Drug use: Yes    Frequency: 5.0 times per week    Types: Marijuana    Comment: smokes marijuna "every now and then during the week"    Family History  Problem Relation Age of Onset  . Cancer Mother   . Stroke Sister   . Breast cancer Sister   . Colitis Neg Hx   . Esophageal cancer Neg Hx   . Stomach cancer Neg Hx   . Rectal cancer Neg Hx      Objective:  Constitutional: in no apparent distress,  Blood pressure (!) 143/83, pulse 76, temperature 97.8 F (36.6 C), temperature source Oral, height 5'  6" (1.676 m), weight 128 lb (58.1 kg). Eyes: anicteric Cardiovascular: Cor RRR Respiratory: CTA B; normal respiratory effort Gastrointestinal: liver is not enlarged, spleen is not enlarged Musculoskeletal: peripheral pulses normal, no pedal edema, no clubbing or cyanosis Skin: no porphyria cutanea tarda Lymphatic: no cervical lymphadenopathy   Laboratory Genotype: No results found for: HCVGENOTYPE HCV viral load: No results found for: HCVQUANT Lab Results  Component Value Date   WBC 7.5 02/03/2017   HGB 15.9 02/03/2017   HCT 46.3 02/03/2017   MCV 95.5 02/03/2017   PLT 162 02/03/2017    Lab Results  Component Value Date   CREATININE 0.74 05/20/2017    BUN 10 05/20/2017   NA 136 05/20/2017   K 3.9 05/20/2017   CL 106 05/20/2017   CO2 24 05/20/2017    Lab Results  Component Value Date   ALT 53 (H) 05/20/2017   AST 49 (H) 05/20/2017   ALKPHOS 61 01/29/2017     Labs and history reviewed and show CHILD-PUGH A  5-6 points: Child class A 7-9 points: Child class B 10-15 points: Child class C  Lab Results  Component Value Date   INR 1.0 11/07/2008   BILITOT 2.4 (H) 05/20/2017   ALBUMIN 3.6 01/29/2017     Assessment: New Patient with Chronic Hepatitis C genotype unknown, untreated.  I discussed with the patient the lab findings that confirm chronic hepatitis C as well as the natural history and progression of disease including about 30% of people who develop cirrhosis of the liver if left untreated and once cirrhosis is established there is a 2-7% risk per year of liver cancer and liver failure.  I discussed the importance of treatment and benefits in reducing the risk, even if significant liver fibrosis exists.   Plan: 1) Patient counseled extensively on limiting acetaminophen to no more than 2 grams daily, avoidance of alcohol. 2) Transmission discussed with patient including sexual transmission, sharing razors and toothbrush.   3) Will need referral to gastroenterology if concern for cirrhosis 4) Will need referral for substance abuse counseling: No.; Further work up to include urine drug screen  No. 5) Will prescribe appropriate medication based on genotype and coverage  6) Hepatitis A and B titers - previously had one dose of the vaccines.   7) Pneumovax vaccine previously given 9) Further work up to include liver staging with elastography 10) will follow up after labs and elastography to discuss treatment options.

## 2017-07-09 LAB — PROTIME-INR
INR: 1.1
Prothrombin Time: 11.2 s (ref 9.0–11.5)

## 2017-07-09 LAB — HEPATITIS B SURFACE ANTIBODY,QUALITATIVE: Hep B S Ab: NONREACTIVE

## 2017-07-09 LAB — HEPATITIS B SURFACE ANTIGEN: Hepatitis B Surface Ag: NONREACTIVE

## 2017-07-09 LAB — HIV ANTIBODY (ROUTINE TESTING W REFLEX): HIV 1&2 Ab, 4th Generation: NONREACTIVE

## 2017-07-09 LAB — HEPATITIS A ANTIBODY, TOTAL: Hepatitis A AB,Total: REACTIVE — AB

## 2017-07-09 LAB — HEPATITIS B CORE ANTIBODY, TOTAL: Hep B Core Total Ab: NONREACTIVE

## 2017-07-10 ENCOUNTER — Ambulatory Visit (HOSPITAL_COMMUNITY)
Admission: RE | Admit: 2017-07-10 | Discharge: 2017-07-10 | Disposition: A | Discharge status: Home / Self Care | Payer: Medicare Other | Source: Ambulatory Visit | Attending: Internal Medicine | Admitting: Internal Medicine

## 2017-07-10 DIAGNOSIS — B182 Chronic viral hepatitis C: Secondary | ICD-10-CM | POA: Diagnosis not present

## 2017-07-11 LAB — LIVER FIBROSIS, FIBROTEST-ACTITEST
ALT: 102 U/L — ABNORMAL HIGH (ref 9–46)
Alpha-2-Macroglobulin: 257 mg/dL (ref 106–279)
Apolipoprotein A1: 156 mg/dL (ref 94–176)
Bilirubin: 1.1 mg/dL (ref 0.2–1.2)
Fibrosis Score: 0.81
GGT: 249 U/L — ABNORMAL HIGH (ref 3–70)
Haptoglobin: 105 mg/dL (ref 43–212)
Necroinflammat ACT Score: 0.75
Reference ID: 2397428

## 2017-07-29 ENCOUNTER — Encounter: Payer: Self-pay | Admitting: Internal Medicine

## 2017-07-29 ENCOUNTER — Ambulatory Visit (INDEPENDENT_AMBULATORY_CARE_PROVIDER_SITE_OTHER): Payer: Medicare Other | Admitting: Internal Medicine

## 2017-07-29 VITALS — BP 125/68 | HR 71 | Temp 97.6°F | Resp 18 | Ht 66.0 in | Wt 126.0 lb

## 2017-07-29 DIAGNOSIS — Z7189 Other specified counseling: Secondary | ICD-10-CM

## 2017-07-29 DIAGNOSIS — K7400 Hepatic fibrosis, unspecified: Secondary | ICD-10-CM

## 2017-07-29 DIAGNOSIS — F1010 Alcohol abuse, uncomplicated: Secondary | ICD-10-CM

## 2017-07-29 DIAGNOSIS — B182 Chronic viral hepatitis C: Secondary | ICD-10-CM | POA: Diagnosis not present

## 2017-07-29 DIAGNOSIS — Z7185 Encounter for immunization safety counseling: Secondary | ICD-10-CM | POA: Insufficient documentation

## 2017-07-29 LAB — HEPATITIS C GENOTYPE

## 2017-07-29 MED ORDER — LEDIPASVIR-SOFOSBUVIR 90-400 MG PO TABS: 1.0000 | ORAL_TABLET | Freq: Every day | ORAL | 2 refills | Status: DC

## 2017-07-29 MED ORDER — PNEUMOCOCCAL 13-VAL CONJ VACC IM SUSP: 0.5000 mL | INTRAMUSCULAR | Status: DC

## 2017-07-29 MED ORDER — HEPATITIS B VAC RECOMBINANT 10 MCG/0.5ML IJ SUSP
0.5000 mL | Freq: Once | INTRAMUSCULAR | Status: AC
  Administered 2017-07-29: 0.5 mL via INTRAMUSCULAR

## 2017-07-29 NOTE — Patient Instructions (Addendum)
Date 07/29/17  Dear Mr. Pavlak, As discussed in the [facility], your hepatitis C therapy will include the following medications:          Harvoni 90mg /400mg  tablet:           Take 1 tablet by mouth once daily   Please note that ALL MEDICATIONS WILL START ON THE SAME DATE for a total of 12 weeks. 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  Remember, each prescription is for 28 days. ---------------------------------------------------------------- GENERAL NOTES REGARDING YOUR HEPATITIS C MEDICATION:  SOFOSBUVIR/LEDIPASVIR (HARVONI): - Harvoni tablet is taken daily with OR without food. - The tablets are orange. - The tablets should be stored at room temperature.  - Acid reducing agents such as H2 blockers (ie. Pepcid (famotidine), Zantac (ranitidine), Tagamet (cimetidine), Axid (nizatidine) and proton pump inhibitors (ie. Prilosec (omeprazole), Protonix (pantoprazole), Nexium (esomeprazole), or Aciphex (rabeprazole)) can decrease effectiveness of Harvoni. Do not take until you have discussed with a health care provider.    -Antacids that contain magnesium and/or aluminum hydroxide (ie. Milk of Magensia, Rolaids, Gaviscon, Maalox, Mylanta, an dArthritis Pain Formula)can reduce absorption of Harvoni, so take them at least 4 hours before or after Harvoni.  -Calcium carbonate (calcium supplements or antacids such as Tums, Caltrate, Os-Cal)needs to be taken  at least 4 hours hours before or after Harvoni.  -St. John's wort or any products that contain St. John's wort like some herbal supplements  Please inform the office prior to starting any of these medications.  - The common side effects associated with Harvoni include:      1. Fatigue      2. Headache      3. Nausea      4. Diarrhea      5. Insomnia  Please note that this only lists the most common side effects and is NOT a comprehensive list of the potential side effects of these medications. For more information, please review the drug information sheets that come with your medication package from the pharmacy.  ---------------------------------------------------------------- GENERAL HELPFUL HINTS ON HCV THERAPY: 1. Stay well-hydrated. 2. Notify the [facility] of any changes in your other over-the-counter/herbal or prescription medications. 3. If you miss a dose of your medication, take the missed dose as soon as you remember. Return to your regular time/dose schedule the next day.  4.  Do not stop taking your medications without first talking with your healthcare provider. 5.  You may take Tylenol (acetaminophen), as long as the dose is less than 2000 mg (OR no more than 4 tablets of the Tylenol Extra Strengths 500mg  tablet) in 24 hours. 6.  You will see our pharmacist-specialist within the first 2 weeks of starting your medication to monitor for any possible side effects. 7.  You will have labs once during treatment, after soon after treatment completion and one final lab 6 months after treatment completion to verify the virus is out of your system.  Thayer Headings, [place] [facility] [place], [place]  [phone]

## 2017-07-29 NOTE — Assessment & Plan Note (Signed)
Discussed with patient.   Needs to stop drinking beer.  No HCC screening indicated.

## 2017-07-29 NOTE — Progress Notes (Signed)
   Subjective:    Patient ID: Ryan Ortega, male    DOB: July 01, 1945, 72 y.o.   MRN: [id number]  HPI Here for follow up of chronic hepatitis C He has genotype 1a, positive viral load and elastography with F2/3.  No new issues.  No associated fatigue.  No complaints.     Review of Systems  Constitutional: Negative for fatigue.  Gastrointestinal: Negative for diarrhea.  Skin: Negative for rash.       Objective:   Physical Exam  Constitutional: He appears well-developed and well-nourished. No distress.  HENT:  Mouth/Throat: No oropharyngeal exudate.  Eyes: No scleral icterus.  Cardiovascular: Normal rate, regular rhythm and normal heart sounds.  No murmur heard. Pulmonary/Chest: Effort normal and breath sounds normal. No respiratory distress.  Skin: No rash noted.   SH: occasional beer       Assessment & Plan:

## 2017-07-29 NOTE — Assessment & Plan Note (Signed)
Counseled on quitting completely.

## 2017-07-29 NOTE — Assessment & Plan Note (Signed)
Will get him on Harvoni, 12 weeks.  He will be called when ready

## 2017-07-29 NOTE — Assessment & Plan Note (Signed)
Will start hepatitis B series.

## 2017-08-05 ENCOUNTER — Telehealth: Payer: Self-pay | Admitting: Pharmacist

## 2017-08-05 ENCOUNTER — Encounter: Payer: Self-pay | Admitting: Pharmacy Technician

## 2017-08-05 MED FILL — LEDIPASVIR-SOFOSBUVIR 90-40: 90-400 | 28 days supply | Qty: 28 | Fill #0

## 2017-08-05 NOTE — Telephone Encounter (Signed)
Patient has been approved for Harvoni x 12 weeks for his chronic Hepatitis C infection.  Counseled him on how to take it including at the same time each day and to not miss any doses.  Also counseled on the possibility of nausea, headache, and fatigue.  He is on no other medications except "a pain pill". No OTC or Rx medications for acid reflux or heartburn.  He will f/u with [place] on 5/22.

## 2017-08-07 ENCOUNTER — Encounter: Payer: Self-pay | Admitting: Internal Medicine

## 2017-09-01 MED FILL — LEDIPASVIR-SOFOSBUVIR 90-40: 90-400 | 28 days supply | Qty: 28 | Fill #1

## 2017-09-03 ENCOUNTER — Ambulatory Visit (INDEPENDENT_AMBULATORY_CARE_PROVIDER_SITE_OTHER): Payer: Medicare Other | Admitting: Pharmacist Clinician (PhC)/ Clinical Pharmacy Specialist

## 2017-09-03 DIAGNOSIS — B182 Chronic viral hepatitis C: Secondary | ICD-10-CM | POA: Diagnosis not present

## 2017-09-03 DIAGNOSIS — Z23 Encounter for immunization: Secondary | ICD-10-CM | POA: Diagnosis not present

## 2017-09-03 LAB — COMPLETE METABOLIC PANEL WITH GFR
AG Ratio: 1.2 (calc) (ref 1.0–2.5)
ALT: 24 U/L (ref 9–46)
AST: 31 U/L (ref 10–35)
Albumin: 4.2 g/dL (ref 3.6–5.1)
Alkaline phosphatase (APISO): 59 U/L (ref 40–115)
BUN: 12 mg/dL (ref 7–25)
CO2: 25 mmol/L (ref 20–32)
Calcium: 9.4 mg/dL (ref 8.6–10.3)
Chloride: 107 mmol/L (ref 98–110)
Creat: 0.71 mg/dL (ref 0.70–1.18)
GFR, Est African American: 108 mL/min/{1.73_m2} (ref >=60)
GFR, Est Non African American: 93 mL/min/{1.73_m2} (ref >=60)
Globulin: 3.4 g/dL (calc) (ref 1.9–3.7)
Glucose, Bld: 86 mg/dL (ref 65–99)
Potassium: 4.2 mmol/L (ref 3.5–5.3)
Sodium: 141 mmol/L (ref 135–146)
Total Bilirubin: 1.1 mg/dL (ref 0.2–1.2)
Total Protein: 7.6 g/dL (ref 6.1–8.1)

## 2017-09-03 NOTE — Progress Notes (Signed)
HPI: Ryan Ortega is a 72 y.o. male who is here for his hep C visit with pharmacy.   Lab Results  Component Value Date   HCVGENOTYPE 1a 07/08/2017    Allergies: No Known Allergies  Vitals:    Past Medical History: Past Medical History:  Diagnosis Date  . Allergy   . Chronic hepatitis C without mention of hepatic coma   . H. pylori infection   . Hearing loss of aging   . Internal hemorrhoids without mention of complication   . Lipoma of unspecified site   . Loss of weight   . Mild cognitive impairment, so stated   . Osteoarthrosis, unspecified whether generalized or localized, unspecified site   . Pain in joint, forearm   . Personal history of colonic adenoma 06/25/2012  . Rash and other nonspecific skin eruption   . Routine general medical examination at a health care facility   . Substance abuse ([place])   . Tinnitus of both ears   . Tobacco use disorder   . Unspecified visual loss     Social History: Social History   Socioeconomic History  . Marital status: Married    Spouse name: Not on file  . Number of children: 4  . Years of education: Not on file  . Highest education level: Not on file  Occupational History  . Occupation: retired    Fish farm manager: Advertising copywriter  Social Needs  . Financial resource strain: Not hard at all  . Food insecurity:    Worry: Never true    Inability: Never true  . Transportation needs:    Medical: No    Non-medical: No  Tobacco Use  . Smoking status: Current Some Day Smoker    Packs/day: 0.25    Years: 40.00    Pack years: 10.00    Types: Cigarettes  . Smokeless tobacco: Never Used  Substance and Sexual Activity  . Alcohol use: Yes    Alcohol/week: 10.8 oz    Types: 18 Cans of beer per week  . Drug use: Yes    Frequency: 5.0 times per week    Types: Marijuana    Comment: smokes marijuna "every now and then during the week"  . Sexual activity: Yes  Lifestyle  . Physical activity:    Days per week: 0 days    Minutes  per session: 0 min  . Stress: Not at all  Relationships  . Social connections:    Talks on phone: More than three times a week    Gets together: More than three times a week    Attends religious service: Never    Active member of club or organization: No    Attends meetings of clubs or organizations: Never    Relationship status: Married  Other Topics Concern  . Not on file  Social History Narrative   He is married with 4 children   He is retired from concrete work   Drinks about 18 cans of beer a week   Admits to using some marijuana   He is an intermittent smoker   04/07/2017    Labs: Hep B S Ab (no units)  Date Value  07/08/2017 NON-REACTIVE   Hepatitis B Surface Ag (no units)  Date Value  07/08/2017 NON-REACTIVE    Lab Results  Component Value Date   HCVGENOTYPE 1a 07/08/2017    Hepatitis C RNA quantitative Latest Ref Rng & Units 05/20/2017  HCV Quantitative Log NOT DETECT Log IU/mL 6.58(H)  AST (U/L)  Date Value  05/20/2017 49 (H)  02/03/2017 27  01/29/2017 21   ALT (U/L)  Date Value  07/08/2017 102 (H)  05/20/2017 53 (H)  02/03/2017 37  01/29/2017 26   INR (no units)  Date Value  07/08/2017 1.1  11/07/2008 1.0    CrCl: CrCl cannot be calculated (Patient's most recent lab result is older than the maximum 21 days allowed.).  Fibrosis Score: F2/3 as assessed by ARFI  Child-Pugh Score: Class A  Previous Treatment Regimen: None  Assessment: Ryan Ortega is here today for his hep C visit with pharmacy. Supposedly, he started on his Harvoni on 4/24. He must be on the generic version because it's in a dosepak rather than a bottle. He showed me that he has 4 left. The math doesn't seem right because he should be out or almost out today. He stated that he may have started a bit later than the documented date above but he has probably missed about 2-3 doses due to forgetfulness. Counseled him extensively on the importance of adherence to prevent failure.  He said that he'll do better. Harvoni is the only medication that he is on currently. We will get labs today. We will see him at the [facility] visit for labs then cure with pharmacy.   He is non-cirrhotic and plt>150k. Will give him the 2nd hep B today.  Recommendations:  Hep B #2 Continue Harvoni 1 daily x 12 wks Hep C labs today F/u with pharmacy at [facility] then cure with pharmacy and [place], Pharm.D., BCPS, AAHIVP [facility] 09/03/2017, 9:04 AM

## 2017-09-06 LAB — HEPATITIS C RNA QUANTITATIVE
HCV Quantitative Log: <1.18 Log IU/mL
HCV RNA, PCR, QN: <15 IU/mL

## 2017-09-18 ENCOUNTER — Encounter: Payer: Self-pay | Admitting: Internal Medicine

## 2017-09-18 ENCOUNTER — Ambulatory Visit (INDEPENDENT_AMBULATORY_CARE_PROVIDER_SITE_OTHER): Payer: Medicare Other | Admitting: Internal Medicine

## 2017-09-18 VITALS — BP 118/60 | HR 74 | Temp 97.9°F | Ht 66.0 in | Wt 124.0 lb

## 2017-09-18 DIAGNOSIS — Z716 Tobacco abuse counseling: Secondary | ICD-10-CM

## 2017-09-18 DIAGNOSIS — M4802 Spinal stenosis, cervical region: Secondary | ICD-10-CM | POA: Diagnosis not present

## 2017-09-18 DIAGNOSIS — G992 Myelopathy in diseases classified elsewhere: Secondary | ICD-10-CM | POA: Diagnosis not present

## 2017-09-18 DIAGNOSIS — R630 Anorexia: Secondary | ICD-10-CM

## 2017-09-18 DIAGNOSIS — F17209 Nicotine dependence, unspecified, with unspecified nicotine-induced disorders: Secondary | ICD-10-CM

## 2017-09-18 DIAGNOSIS — B182 Chronic viral hepatitis C: Secondary | ICD-10-CM | POA: Diagnosis not present

## 2017-09-18 DIAGNOSIS — R634 Abnormal weight loss: Secondary | ICD-10-CM

## 2017-09-18 DIAGNOSIS — F1010 Alcohol abuse, uncomplicated: Secondary | ICD-10-CM | POA: Diagnosis not present

## 2017-09-18 MED ORDER — NICOTINE 14 MG/24HR TD PT24: 14.0000 mg | MEDICATED_PATCH | TRANSDERMAL | 0 refills | Status: DC

## 2017-09-18 MED ORDER — MIRTAZAPINE 15 MG PO TABS: 15.0000 mg | ORAL_TABLET | Freq: Every day | ORAL | 3 refills | Status: DC

## 2017-09-18 NOTE — Progress Notes (Signed)
Location:  [facility] clinic Provider:  Maycol Hoying L. Mariea Clonts, D.O., C.M.D.  Code Status: full code Goals of Care:  Advanced Directives 09/18/2017  Does Patient Have a Medical Advance Directive? No  Would patient like information on creating a medical advance directive? No - Patient declined  Pre-existing out of facility DNR order (yellow form or pink MOST form) -     Chief Complaint  Patient presents with  . Medical Management of Chronic Issues    61mth follow-up    HPI: Patient is a 72 y.o. male seen today for medical management of chronic diseases.    He's being seen in the hepaitis c clinic and getting managed there.   He is dropping weight again.  Appetite is bad.  He has not eaten today.  Had steak and liver yesterday with fried potatoes.  Coca cola and beer yesterday.      He denies any concerns himself.  We discussed his weight loss and he does want to restart remeron for it.  He's 124 down from 135 in feb.  Says his appetite is poor.  Still drinks alcohol and soda, poor food choices (as above).  He was treated for h pylori in late December, but it' appears he did not properly complete the course, but felt better and had gained weight when he saw Janett Billow in feb and h pylori breath test was done.  It was negative.  Still smoking cigarettes--1 pack lasts 3 days.  He does have interest in quitting.  He says he will try patches to help him quit.  Educated on how they work, need for gradual dose of nicotine reduction/taper.    He was being seen in the hepatitis C clinic for treatment with harvoni.  He supposedly finished it.  He also got his second hep b shot on 5/22 and f/u labs that day.    When I've seen him, he usually c/o his neck pain, and was prescribed gabapentin for this long ago which he reported helped his numbness, tingling and dropping things. He has adamantly refused a surgery consult.  He was given hydrocodone once by NP in October #15, but never asked for more.    Past Medical  History:  Diagnosis Date  . Allergy   . Chronic hepatitis C without mention of hepatic coma   . H. pylori infection   . Hearing loss of aging   . Internal hemorrhoids without mention of complication   . Lipoma of unspecified site   . Loss of weight   . Mild cognitive impairment, so stated   . Osteoarthrosis, unspecified whether generalized or localized, unspecified site   . Pain in joint, forearm   . Personal history of colonic adenoma 06/25/2012  . Rash and other nonspecific skin eruption   . Routine general medical examination at a health care facility   . Substance abuse ([facility])   . Tinnitus of both ears   . Tobacco use disorder   . Unspecified visual loss     Past Surgical History:  Procedure Laterality Date  . COLONOSCOPY    . left shoulder  1990    No Known Allergies  Outpatient Encounter Medications as of 09/18/2017  Medication Sig  . Ledipasvir-Sofosbuvir (HARVONI) 90-400 MG TABS Take 1 tablet by mouth daily.   No facility-administered encounter medications on file as of 09/18/2017.     Review of Systems:  Review of Systems  Constitutional: Positive for weight loss. Negative for chills and fever.  HENT: Negative for  congestion.   Eyes: Negative for blurred vision.  Respiratory: Negative for cough and shortness of breath.   Cardiovascular: Negative for chest pain, palpitations and leg swelling.  Gastrointestinal: Negative for abdominal pain, blood in stool, constipation and melena.  Genitourinary: Negative for dysuria.  Musculoskeletal: Positive for joint pain and neck pain. Negative for falls.  Skin: Negative for itching and rash.  Neurological: Negative for dizziness and loss of consciousness.  Psychiatric/Behavioral: Positive for memory loss and substance abuse. Negative for depression. The patient is not nervous/anxious and does not have insomnia.     Health Maintenance  Topic Date Due  . COLONOSCOPY  08/31/2017  . INFLUENZA VACCINE  11/13/2017  .  TETANUS/TDAP  05/18/2023  . Hepatitis C Screening  Completed  . PNA vac Low Risk Adult  Completed    Physical Exam: Vitals:   09/18/17 1028  BP: 118/60  Pulse: 74  Temp: 97.9 F (36.6 C)  TempSrc: Oral  SpO2: 97%  Weight: 124 lb (56.2 kg)  Height: 5\' 6"  (1.676 m)   Body mass index is 20.01 kg/m. Physical Exam  Constitutional: He is oriented to person, place, and time. No distress.  Cachectic male  HENT:  Head: Normocephalic and atraumatic.  Cardiovascular: Normal rate and regular rhythm.  Pulmonary/Chest: Effort normal and breath sounds normal. No respiratory distress.  Abdominal: Bowel sounds are normal.  Musculoskeletal: Normal range of motion.  Neurological: He is alert and oriented to person, place, and time.  Skin: Skin is warm and dry.  Psychiatric:  Flat affect, poor eye contact, looks everywhere but at me    Labs reviewed: Basic Metabolic Panel: Recent Labs    02/03/17 0150 05/20/17 1052 09/03/17 0904  NA 136 136 141  K 3.4* 3.9 4.2  CL 97* 106 107  CO2 32 24 25  GLUCOSE 105* 99 86  BUN 12 10 12   CREATININE 0.84 0.74 0.71  CALCIUM 9.9 9.0 9.4   Liver Function Tests: Recent Labs    10/24/16 0946 01/29/17 0014 02/03/17 0150 05/20/17 1052 07/08/17 0905 09/03/17 0904  AST 24 21 27  49*  --  31  ALT 31 26 37 53* 102* 24  ALKPHOS 74 61  --   --   --   --   BILITOT 1.6* 2.3* 2.1* 2.4*  --  1.1  PROT 7.5 7.5 7.6 7.5  --  7.6  ALBUMIN 3.9 3.6  --   --   --   --    Recent Labs    10/24/16 0946 01/29/17 0014  LIPASE 22 24   No results for input(s): AMMONIA in the last 8760 hours. CBC: Recent Labs    10/24/16 0946 01/29/17 0014 02/03/17 0150  WBC 6.3 8.8 7.5  NEUTROABS 4.5  --  4,725  HGB 15.4 15.4 15.9  HCT 43.7 44.1 46.3  MCV 96.7 96.3 95.5  PLT 182 167 162   Lipid Panel: Recent Labs    05/20/17 1052  CHOL 109  HDL 62  LDLCALC 33  TRIG 60  CHOLHDL 1.8   Assessment/Plan 1. Weight loss -has resumed -had stabilized after h  pylori tx and f/u test was negative despite poor adherence to regimen -does not c/o abdominal pain now, just poor appetite--food selections poor and admits to drinking more beer and not eating much food - mirtazapine (REMERON) 15 MG tablet; Take 1 tablet (15 mg total) by mouth at bedtime.  Dispense: 90 tablet; Refill: 3  2. Poor appetite - see #1 - mirtazapine (REMERON) 15  MG tablet; Take 1 tablet (15 mg total) by mouth at bedtime.  Dispense: 90 tablet; Refill: 3  3. Chronic hep C w/o hepatic coma -completed harvoni therapy and to f/u with hep c clinic -got second hep b vaccine now -has not been 100% adherent with this -is drinking also   4. Alcohol abuse -has been counseled to avoid alcohol--has alcoholism   5. Encounter for smoking cessation counseling -is interested in smoking cessation so will start him on nicotine patches 14mg  for one month, then he's to call if he's been able to stay off cigarettes and we'll send in a 7mg  patch daily for another month - nicotine (NICODERM CQ) 14 mg/24hr patch; Place 1 patch (14 mg total) onto the skin daily.  Dispense: 28 patch; Refill: 0 -not to smoke while taking the patch and was counseled about benefits of quitting, risks of continued tobacco abuse for 3+ mins  6. Tobacco use disorder, continuous -ongoing, but interested in quitting: - nicotine (NICODERM CQ) 14 mg/24hr patch; Place 1 patch (14 mg total) onto the skin daily.  Dispense: 28 patch; Refill: 0   Labs/tests ordered:  No orders of the defined types were placed in this encounter.  Next appt:  01/22/2018   Allysson Rinehimer L. Keron Neenan, D.O. [facility] [address], [place] [address] Cell Phone (Mon-Fri 8am-5pm):  [phone] On Call:  [phone] & follow prompts after 5pm & weekends Office Phone:  [phone] Office Fax:  [phone]

## 2017-09-18 NOTE — Patient Instructions (Addendum)
Stop smoking tomorrow.  Apply a patch to your skin.  Change it daily.  Do not smoke and use the patches at the same time because it can be dangerous for your heart.  Use the patches for 28 days.  About a week before you run out, call me so we can start the next dosage down to help you wean off the nicotine.  Coping with Quitting Smoking Quitting smoking is a physical and mental challenge. You will face cravings, withdrawal symptoms, and temptation. Before quitting, work with your health care provider to make a plan that can help you cope. Preparation can help you quit and keep you from giving in. How can I cope with cravings? Cravings usually last for 5-10 minutes. If you get through it, the craving will pass. Consider taking the following actions to help you cope with cravings:  Keep your mouth busy: ? Chew sugar-free gum. ? Suck on hard candies or a straw. ? Brush your teeth.  Keep your hands and body busy: ? Immediately change to a different activity when you feel a craving. ? Squeeze or play with a ball. ? Do an activity or a hobby, like making bead jewelry, practicing needlepoint, or working with wood. ? Mix up your normal routine. ? Take a short exercise break. Go for a quick walk or run up and down stairs. ? Spend time in public places where smoking is not allowed.  Focus on doing something kind or helpful for someone else.  Call a friend or family member to talk during a craving.  Join a support group.  Call a quit line, such as 1-800-QUIT-NOW.  Talk with your health care provider about medicines that might help you cope with cravings and make quitting easier for you.  How can I deal with withdrawal symptoms? Your body may experience negative effects as it tries to get used to not having nicotine in the system. These effects are called withdrawal symptoms. They may include:  Feeling hungrier than normal.  Trouble concentrating.  Irritability.  Trouble  sleeping.  Feeling depressed.  Restlessness and agitation.  Craving a cigarette.  To manage withdrawal symptoms:  Avoid places, people, and activities that trigger your cravings.  Remember why you want to quit.  Get plenty of sleep.  Avoid coffee and other caffeinated drinks. These may worsen some of your symptoms.  How can I handle social situations? Social situations can be difficult when you are quitting smoking, especially in the first few weeks. To manage this, you can:  Avoid parties, bars, and other social situations where people might be smoking.  Avoid alcohol.  Leave right away if you have the urge to smoke.  Explain to your family and friends that you are quitting smoking. Ask for understanding and support.  Plan activities with friends or family where smoking is not an option.  What are some ways I can cope with stress? Wanting to smoke may cause stress, and stress can make you want to smoke. Find ways to manage your stress. Relaxation techniques can help. For example:  Breathe slowly and deeply, in through your nose and out through your mouth.  Listen to soothing, relaxing music.  Talk with a family member or friend about your stress.  Light a candle.  Soak in a bath or take a shower.  Think about a peaceful place.  What are some ways I can prevent weight gain? Be aware that many people gain weight after they quit smoking. However, not everyone  does. To keep from gaining weight, have a plan in place before you quit and stick to the plan after you quit. Your plan should include:  Having healthy snacks. When you have a craving, it may help to: ? Eat plain popcorn, crunchy carrots, celery, or other cut vegetables. ? Chew sugar-free gum.  Changing how you eat: ? Eat small portion sizes at meals. ? Eat 4-6 small meals throughout the day instead of 1-2 large meals a day. ? Be mindful when you eat. Do not watch television or do other things that might  distract you as you eat.  Exercising regularly: ? Make time to exercise each day. If you do not have time for a long workout, do short bouts of exercise for 5-10 minutes several times a day. ? Do some form of strengthening exercise, like weight lifting, and some form of aerobic exercise, like running or swimming.  Drinking plenty of water or other low-calorie or no-calorie drinks. Drink 6-8 glasses of water daily, or as much as instructed by your health care provider.  Summary  Quitting smoking is a physical and mental challenge. You will face cravings, withdrawal symptoms, and temptation to smoke again. Preparation can help you as you go through these challenges.  You can cope with cravings by keeping your mouth busy (such as by chewing gum), keeping your body and hands busy, and making calls to family, friends, or a helpline for people who want to quit smoking.  You can cope with withdrawal symptoms by avoiding places where people smoke, avoiding drinks with caffeine, and getting plenty of rest.  Ask your health care provider about the different ways to prevent weight gain, avoid stress, and handle social situations. This information is not intended to replace advice given to you by your health care provider. Make sure you discuss any questions you have with your health care provider. Document Released: 03/29/2016 Document Revised: 03/29/2016 Document Reviewed: 03/29/2016 Elsevier Interactive Patient Education  [employer].

## 2017-09-26 MED FILL — LEDIPASVIR-SOFOSBUVIR 90-40: 90-400 | 28 days supply | Qty: 28 | Fill #2

## 2017-11-05 ENCOUNTER — Ambulatory Visit (INDEPENDENT_AMBULATORY_CARE_PROVIDER_SITE_OTHER): Payer: Medicare Other | Admitting: Pharmacist

## 2017-11-05 DIAGNOSIS — B182 Chronic viral hepatitis C: Secondary | ICD-10-CM | POA: Diagnosis not present

## 2017-11-05 NOTE — Progress Notes (Signed)
HPI: Ryan Ortega is a 72 y.o. male who presents to the [place] clinic for Hep C follow-up.  Hepatitis C Genotype: 1a  Fibrosis Score: F2/F3  Initial Hepatitis C RNA: 3.8 million, <15  Patient Active Problem List   Diagnosis Date Noted  . Liver fibrosis 07/29/2017  . Vaccine counseling 07/29/2017  . H. pylori infection 02/17/2017  . Screening, lipid 06/20/2016  . Pulmonary nodules/lesions, multiple 11/04/2014  . Tobacco abuse 11/04/2014  . 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 Alcohol abuse 11/04/2014  . 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 Smoking 1/2 pack a day or less 10/04/2013  . Dyspnea on exertion 10/04/2013  . Bilateral leg cramps 10/04/2013  . Chronic hepatitis C without hepatic coma ([place]) 10/04/2013  . Loss of weight   . Personal history of colonic adenoma 06/19/2009    Patient's Medications  New Prescriptions   No medications on file  Previous Medications   LEDIPASVIR-SOFOSBUVIR (HARVONI) 90-400 MG TABS    Take 1 tablet by mouth daily.   MIRTAZAPINE (REMERON) 15 MG TABLET    Take 1 tablet (15 mg total) by mouth at bedtime.   NICOTINE (NICODERM CQ) 14 MG/24HR PATCH    Place 1 patch (14 mg total) onto the skin daily.  Modified Medications   No medications on file  Discontinued Medications   No medications on file    Allergies: No Known Allergies  Past Medical History: Past Medical History:  Diagnosis Date  . Allergy   . Chronic hepatitis C without mention of hepatic coma   . H. pylori infection   . Hearing loss of aging   . Internal hemorrhoids without mention of complication   . Lipoma of unspecified site   . Loss of weight   . Mild cognitive impairment, so stated   . Osteoarthrosis, unspecified whether generalized or localized, unspecified site   . Pain in joint, forearm   . Personal history of colonic adenoma 06/25/2012  . Rash and other nonspecific skin eruption   . Routine general medical  examination at a health care facility   . Substance abuse ([place])   . Tinnitus of both ears   . Tobacco use disorder   . Unspecified visual loss     Social History: Social History   Socioeconomic History  . Marital status: Married    Spouse name: Not on file  . Number of children: 4  . Years of education: Not on file  . Highest education level: Not on file  Occupational History  . Occupation: retired    Fish farm manager: Advertising copywriter  Social Needs  . Financial resource strain: Not hard at all  . Food insecurity:    Worry: Never true    Inability: Never true  . Transportation needs:    Medical: No    Non-medical: No  Tobacco Use  . Smoking status: Current Some Day Smoker    Packs/day: 0.25    Years: 40.00    Pack years: 10.00    Types: Cigarettes  . Smokeless tobacco: Never Used  Substance and Sexual Activity  . Alcohol use: Yes    Alcohol/week: 10.8 oz    Types: 18 Cans of beer per week  . Drug use: Yes    Frequency: 5.0 times per week    Types: Marijuana    Comment: smokes marijuna "every now and then during the week"  . Sexual activity: Yes  Lifestyle  . Physical activity:    Days  per week: 0 days    Minutes per session: 0 min  . Stress: Not at all  Relationships  . Social connections:    Talks on phone: More than three times a week    Gets together: More than three times a week    Attends religious service: Never    Active member of club or organization: No    Attends meetings of clubs or organizations: Never    Relationship status: Married  Other Topics Concern  . Not on file  Social History Narrative   He is married with 4 children   He is retired from concrete work   Drinks about 18 cans of beer a week   Admits to using some marijuana   He is an intermittent smoker   04/07/2017    Labs: Hepatitis C Lab Results  Component Value Date   HCVGENOTYPE 1a 07/08/2017   HCVRNAPCRQN <15 NOT DETECTED 09/03/2017   HCVRNAPCRQN 3,830,000 (H) 05/20/2017    FIBROSTAGE F4 07/08/2017   Hepatitis B Lab Results  Component Value Date   HEPBSAB NON-REACTIVE 07/08/2017   HEPBSAG NON-REACTIVE 07/08/2017   HEPBCAB NON-REACTIVE 07/08/2017   Hepatitis A Lab Results  Component Value Date   HAV REACTIVE (A) 07/08/2017   HIV Lab Results  Component Value Date   HIV NON-REACTIVE 07/08/2017   Lab Results  Component Value Date   CREATININE 0.71 09/03/2017   CREATININE 0.74 05/20/2017   CREATININE 0.84 02/03/2017   CREATININE 0.75 01/29/2017   CREATININE 1.08 10/24/2016   Lab Results  Component Value Date   AST 31 09/03/2017   AST 49 (H) 05/20/2017   AST 27 02/03/2017   ALT 24 09/03/2017   ALT 102 (H) 07/08/2017   ALT 53 (H) 05/20/2017   INR 1.1 07/08/2017   INR 1.0 11/07/2008    Assessment: Ryan Ortega is here today for his end of therapy Hep C follow-up.  He has 2 pills left of his Harvoni. He states he missed no doses and had no side effects except for some weight loss. No fatigue, headaches, nausea, or diarrhea. He has no questions or concerns today. I will check another Hep C viral load and see him for his cure visits in October.   Plan: - Completed Harvoni x 12 weeks (2 pills left) - Hep C VL today - F/u for SVR12 labs 10/23 at 915am - F/u with me for cure visit 10/30 at [facility]. Mont Jagoda, PharmD, [place], [place] for [facility] 11/05/2017, 9:11 AM

## 2017-11-07 LAB — HEPATITIS C RNA QUANTITATIVE
HCV Quantitative Log: <1.18 Log IU/mL
HCV RNA, PCR, QN: <15 IU/mL

## 2018-01-22 ENCOUNTER — Encounter: Payer: Self-pay | Admitting: Internal Medicine

## 2018-01-22 ENCOUNTER — Ambulatory Visit (INDEPENDENT_AMBULATORY_CARE_PROVIDER_SITE_OTHER): Payer: Medicare Other | Admitting: Internal Medicine

## 2018-01-22 VITALS — BP 118/70 | HR 70 | Temp 98.1°F | Ht 66.0 in | Wt 122.0 lb

## 2018-01-22 DIAGNOSIS — R630 Anorexia: Secondary | ICD-10-CM | POA: Diagnosis not present

## 2018-01-22 DIAGNOSIS — Z23 Encounter for immunization: Secondary | ICD-10-CM

## 2018-01-22 DIAGNOSIS — F17209 Nicotine dependence, unspecified, with unspecified nicotine-induced disorders: Secondary | ICD-10-CM

## 2018-01-22 DIAGNOSIS — B351 Tinea unguium: Secondary | ICD-10-CM

## 2018-01-22 DIAGNOSIS — R634 Abnormal weight loss: Secondary | ICD-10-CM

## 2018-01-22 DIAGNOSIS — B182 Chronic viral hepatitis C: Secondary | ICD-10-CM

## 2018-01-22 DIAGNOSIS — F1010 Alcohol abuse, uncomplicated: Secondary | ICD-10-CM

## 2018-01-22 MED ORDER — MIRTAZAPINE 15 MG PO TABS: 15.0000 mg | ORAL_TABLET | Freq: Every day | ORAL | 3 refills | Status: DC

## 2018-01-22 NOTE — Progress Notes (Addendum)
Location:  [facility] clinic Provider:  Vernie Vinciguerra L. Mariea Clonts, D.O., C.M.D.  Goals of Care:  Advanced Directives 09/18/2017  Does Patient Have a Medical Advance Directive? No  Would patient like information on creating a medical advance directive? No - Patient declined  Pre-existing out of facility DNR order (yellow form or pink MOST form) -   Chief Complaint  Patient presents with  . Medical Management of Chronic Issues    46mth follow-up    HPI: Patient is a 72 y.o. male seen today for medical management of chronic diseases.    There has been a lot of confusion about his medications.  He was out of remeron though I sent a 90 day supply with refills to mail order in June.  Resent today.  He has lost 2 more lbs.  Everything is "about the same" when I ask.  Still smoking every now and then and drinking alcohol.    He completed his harvoni therapy for hep c.  He was seen July 24th and had 2 pills left.  He is to f/u with ID 10/30 at 10am.    Left shoulder hurts all the time and he says he's used to it.  Doesn't want anything for it.  Never got nicotine patches.  Cost too much to get locally. Only wants if free.  Past Medical History:  Diagnosis Date  . Allergy   . Chronic hepatitis C without mention of hepatic coma   . H. pylori infection   . Hearing loss of aging   . Internal hemorrhoids without mention of complication   . Lipoma of unspecified site   . Loss of weight   . Mild cognitive impairment, so stated   . Osteoarthrosis, unspecified whether generalized or localized, unspecified site   . Pain in joint, forearm   . Personal history of colonic adenoma 06/25/2012  . Rash and other nonspecific skin eruption   . Routine general medical examination at a health care facility   . Substance abuse ([place])   . Tinnitus of both ears   . Tobacco use disorder   . Unspecified visual loss     Past Surgical History:  Procedure Laterality Date  . COLONOSCOPY    . left shoulder  1990    No  Known Allergies  Outpatient Encounter Medications as of 01/22/2018  Medication Sig  . Ledipasvir-Sofosbuvir (HARVONI) 90-400 MG TABS Take 1 tablet by mouth daily.  . mirtazapine (REMERON) 15 MG tablet Take 1 tablet (15 mg total) by mouth at bedtime.  . nicotine (NICODERM CQ) 14 mg/24hr patch Place 1 patch (14 mg total) onto the skin daily.   No facility-administered encounter medications on file as of 01/22/2018.     Review of Systems:  Review of Systems  Constitutional: Positive for weight loss. Negative for chills, diaphoresis, fever and malaise/fatigue.  HENT: Negative for congestion.   Eyes: Negative for blurred vision.  Respiratory: Negative for shortness of breath.   Cardiovascular: Negative for chest pain, palpitations and leg swelling.  Gastrointestinal: Negative for abdominal pain, blood in stool, constipation and melena.  Genitourinary: Negative for dysuria.  Musculoskeletal: Negative for falls and joint pain.  Skin: Negative for itching and rash.  Neurological: Negative for dizziness and loss of consciousness.  Endo/Heme/Allergies: Does not bruise/bleed easily.  Psychiatric/Behavioral: Negative for depression and memory loss.    Health Maintenance  Topic Date Due  . COLONOSCOPY  08/31/2017  . INFLUENZA VACCINE  11/13/2017  . TETANUS/TDAP  05/18/2023  . Hepatitis C  Screening  Completed  . PNA vac Low Risk Adult  Completed    Physical Exam: Vitals:   01/22/18 1415  BP: 118/70  Pulse: 70  Temp: 98.1 F (36.7 C)  TempSrc: Oral  SpO2: 97%  Weight: 122 lb (55.3 kg)  Height: 5\' 6"  (1.676 m)   Body mass index is 19.69 kg/m. Physical Exam  Constitutional: He is oriented to person, place, and time. He appears well-developed and well-nourished. No distress.  Cardiovascular: Normal rate, regular rhythm, normal heart sounds and intact distal pulses.  Pulmonary/Chest: Effort normal and breath sounds normal. No respiratory distress.  Musculoskeletal: Normal range of  motion.  Neurological: He is alert and oriented to person, place, and time.  Skin: Skin is warm and dry.  Psychiatric:  Flat affect, talks under his breath and makes poor eye contact    Labs reviewed: Basic Metabolic Panel: Recent Labs    02/03/17 0150 05/20/17 1052 09/03/17 0904  NA 136 136 141  K 3.4* 3.9 4.2  CL 97* 106 107  CO2 32 24 25  GLUCOSE 105* 99 86  BUN 12 10 12   CREATININE 0.84 0.74 0.71  CALCIUM 9.9 9.0 9.4   Liver Function Tests: Recent Labs    01/29/17 0014 02/03/17 0150 05/20/17 1052 07/08/17 0905 09/03/17 0904  AST 21 27 49*  --  31  ALT 26 37 53* 102* 24  ALKPHOS 61  --   --   --   --   BILITOT 2.3* 2.1* 2.4*  --  1.1  PROT 7.5 7.6 7.5  --  7.6  ALBUMIN 3.6  --   --   --   --    Recent Labs    01/29/17 0014  LIPASE 24   No results for input(s): AMMONIA in the last 8760 hours. CBC: Recent Labs    01/29/17 0014 02/03/17 0150  WBC 8.8 7.5  NEUTROABS  --  4,725  HGB 15.4 15.9  HCT 44.1 46.3  MCV 96.3 95.5  PLT 167 162   Lipid Panel: Recent Labs    05/20/17 1052  CHOL 109  HDL 62  LDLCALC 33  TRIG 60  CHOLHDL 1.8   Assessment/Plan 1. Weight loss -ongoing, but not taking his remeron (never received for some reason???) -also continues to smoke and drink alcohol, not eating well--makes poor food choices likely due to cost - mirtazapine (REMERON) 15 MG tablet; Take 1 tablet (15 mg total) by mouth at bedtime.  Dispense: 90 tablet; Refill: 3  2. Poor appetite - ongoing, resume remeron - mirtazapine (REMERON) 15 MG tablet; Take 1 tablet (15 mg total) by mouth at bedtime.  Dispense: 90 tablet; Refill: 3  3. Onychomycosis due to dermatophyte - requests podiatric referral for nail trimming - Ambulatory referral to Podiatry  4. Tobacco use disorder, continuous -ongoing, not ready to quit and does not want patches if he has to pay for them  5. Alcohol abuse -ongoing drinking despite hep c and tx  6. Chronic hepatitis C without  hepatic coma (HCC) -s/p harvoni therapy  7. Need for influenza vaccination -flu shot given   Labs/tests ordered:   Orders Placed This Encounter  Procedures  . Ambulatory referral to Podiatry    Referral Priority:   Routine    Referral Type:   Consultation    Referral Reason:   Specialty Services Required    Requested Specialty:   Podiatry    Number of Visits Requested:   1   Next appt:  04/17/2018  Perri Lamagna L. Anabelen Kaminsky, D.O. [facility] [address], [place] [address] Cell Phone (Mon-Fri 8am-5pm):  [phone] On Call:  [phone] & follow prompts after 5pm & weekends Office Phone:  [phone] Office Fax:  [phone]

## 2018-01-22 NOTE — Addendum Note (Signed)
Addended by: Despina Hidden on: 01/22/2018 04:30 PM   Modules accepted: Orders

## 2018-02-04 ENCOUNTER — Other Ambulatory Visit: Payer: Medicare Other

## 2018-02-09 ENCOUNTER — Telehealth: Payer: Self-pay | Admitting: *Deleted

## 2018-02-09 ENCOUNTER — Other Ambulatory Visit: Payer: Medicare Other

## 2018-02-09 ENCOUNTER — Ambulatory Visit: Payer: Medicare Other | Admitting: Podiatry

## 2018-02-09 NOTE — Telephone Encounter (Signed)
Patient wife called to check if appointment schedule was correct. Patient was to come today at 330 and Wednesday at 10 am. She advised they can not come back to back like that and wanted to know if they could have labs on same day as visit with Pharmacy. After checking was advised will be fine to have them done at the same visit. Will let Cassie know

## 2018-02-11 ENCOUNTER — Ambulatory Visit: Payer: Medicare Other

## 2018-02-11 ENCOUNTER — Ambulatory Visit (INDEPENDENT_AMBULATORY_CARE_PROVIDER_SITE_OTHER): Payer: Medicare Other | Admitting: Pharmacist

## 2018-02-11 DIAGNOSIS — Z23 Encounter for immunization: Secondary | ICD-10-CM

## 2018-02-11 DIAGNOSIS — B182 Chronic viral hepatitis C: Secondary | ICD-10-CM

## 2018-02-11 NOTE — Progress Notes (Signed)
HPI: Ryan Ortega is a 72 y.o. male who presents to [facility] for cure visit of Hepatitis C.   Medication: Harvoni x 12 weeks - completed 11/07/17  Start Date: 08/06/17  Hepatitis C Genotype: 1a  Fibrosis Score: F2/F3  Hepatitis C RNA: 3.8 million (05/30/17); <15 (5/22 and 11/05/17)  Patient Active Problem List   Diagnosis Date Noted  . Liver fibrosis 07/29/2017  . Vaccine counseling 07/29/2017  . H. pylori infection 02/17/2017  . Screening, lipid 06/20/2016  . Pulmonary nodules/lesions, multiple 11/04/2014  . Tobacco abuse 11/04/2014  . 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 Alcohol abuse 11/04/2014  . 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 Smoking 1/2 pack a day or less 10/04/2013  . Dyspnea on exertion 10/04/2013  . Bilateral leg cramps 10/04/2013  . Chronic hepatitis C without hepatic coma ([place]) 10/04/2013  . Loss of weight   . Personal history of colonic adenoma 06/19/2009    Patient's Medications  New Prescriptions   No medications on file  Previous Medications   MIRTAZAPINE (REMERON) 15 MG TABLET    Take 1 tablet (15 mg total) by mouth at bedtime.  Modified Medications   No medications on file  Discontinued Medications   No medications on file    Allergies: No Known Allergies  Past Medical History: Past Medical History:  Diagnosis Date  . Allergy   . Chronic hepatitis C without mention of hepatic coma   . H. pylori infection   . Hearing loss of aging   . Internal hemorrhoids without mention of complication   . Lipoma of unspecified site   . Loss of weight   . Mild cognitive impairment, so stated   . Osteoarthrosis, unspecified whether generalized or localized, unspecified site   . Pain in joint, forearm   . Personal history of colonic adenoma 06/25/2012  . Rash and other nonspecific skin eruption   . Routine general medical examination at a health care facility   . Substance abuse ([place])   . Tinnitus of both ears    . Tobacco use disorder   . Unspecified visual loss     Social History: Social History   Socioeconomic History  . Marital status: Married    Spouse name: Not on file  . Number of children: 4  . Years of education: Not on file  . Highest education level: Not on file  Occupational History  . Occupation: retired    Fish farm manager: [employer]  Social Needs  . Financial resource strain: Not hard at all  . Food insecurity:    Worry: Never true    Inability: Never true  . Transportation needs:    Medical: No    Non-medical: No  Tobacco Use  . Smoking status: Current Some Day Smoker    Packs/day: 0.25    Years: 40.00    Pack years: 10.00    Types: Cigarettes  . Smokeless tobacco: Never Used  Substance and Sexual Activity  . Alcohol use: Yes    Alcohol/week: 18.0 standard drinks    Types: 18 Cans of beer per week  . Drug use: Yes    Frequency: 5.0 times per week    Types: Marijuana    Comment: smokes marijuna "every now and then during the week"  . Sexual activity: Yes  Lifestyle  . Physical activity:    Days per week: 0 days    Minutes per session: 0 min  . Stress: Not at all  Relationships  .  Social connections:    Talks on phone: More than three times a week    Gets together: More than three times a week    Attends religious service: Never    Active member of club or organization: No    Attends meetings of clubs or organizations: Never    Relationship status: Married  Other Topics Concern  . Not on file  Social History Narrative   He is married with 4 children   He is retired from concrete work   Drinks about 18 cans of beer a week   Admits to using some marijuana   He is an intermittent smoker   04/07/2017    Labs: Hepatitis C Lab Results  Component Value Date   HCVGENOTYPE 1a 07/08/2017   HCVRNAPCRQN <15 NOT DETECTED 11/05/2017   HCVRNAPCRQN <15 NOT DETECTED 09/03/2017   HCVRNAPCRQN 3,830,000 (H) 05/20/2017   FIBROSTAGE F4 07/08/2017   Hepatitis  B Lab Results  Component Value Date   HEPBSAB NON-REACTIVE 07/08/2017   HEPBSAG NON-REACTIVE 07/08/2017   HEPBCAB NON-REACTIVE 07/08/2017   Hepatitis A Lab Results  Component Value Date   HAV REACTIVE (A) 07/08/2017   HIV Lab Results  Component Value Date   HIV NON-REACTIVE 07/08/2017   Lab Results  Component Value Date   CREATININE 0.71 09/03/2017   CREATININE 0.74 05/20/2017   CREATININE 0.84 02/03/2017   CREATININE 0.75 01/29/2017   CREATININE 1.08 10/24/2016   Lab Results  Component Value Date   AST 31 09/03/2017   AST 49 (H) 05/20/2017   AST 27 02/03/2017   ALT 24 09/03/2017   ALT 102 (H) 07/08/2017   ALT 53 (H) 05/20/2017   INR 1.1 07/08/2017   INR 1.0 11/07/2008    Assessment: Ryan Ortega is doing well. He reports completion of Harvoni and no complaints. Counseled him that he will still be at risk for contracting HCV if he partakes in risky behaviors such as IV drug use. Pt is aware that his HCV antibody will always be positive and only a detectable HCV viral load is indicative of reinfection.    Plan: - Administered HepB vaccine 3/3  - Check HCV viral load   Ryan Ortega, [place] D PGY1 Pharmacy Resident  02/11/2018      10:15 AM

## 2018-02-14 LAB — HEPATITIS C RNA QUANTITATIVE
HCV Quantitative Log: <1.18 Log IU/mL
HCV RNA, PCR, QN: <15 IU/mL

## 2018-02-17 ENCOUNTER — Telehealth: Payer: Self-pay | Admitting: Pharmacist

## 2018-02-17 NOTE — Telephone Encounter (Signed)
Attempted to call patient to let him know that his Hep C viral load was undetectable indicating that he is cured of his infection.  No answer, no voicemail.

## 2018-02-19 ENCOUNTER — Ambulatory Visit: Payer: Medicare Other | Admitting: Podiatry

## 2018-03-06 ENCOUNTER — Emergency Department (HOSPITAL_COMMUNITY): Payer: Medicare Other

## 2018-03-06 ENCOUNTER — Emergency Department (HOSPITAL_COMMUNITY)
Admission: EM | Admit: 2018-03-06 | Discharge: 2018-03-07 | Disposition: A | Discharge status: Home / Self Care | Payer: Medicare Other | Attending: Emergency Medicine | Admitting: Emergency Medicine

## 2018-03-06 ENCOUNTER — Other Ambulatory Visit: Payer: Self-pay

## 2018-03-06 ENCOUNTER — Encounter (HOSPITAL_COMMUNITY): Payer: Self-pay | Admitting: Emergency Medicine

## 2018-03-06 DIAGNOSIS — F10220 Alcohol dependence with intoxication, uncomplicated: Secondary | ICD-10-CM | POA: Insufficient documentation

## 2018-03-06 DIAGNOSIS — S0990XA Unspecified injury of head, initial encounter: Secondary | ICD-10-CM | POA: Diagnosis not present

## 2018-03-06 DIAGNOSIS — S0083XA Contusion of other part of head, initial encounter: Secondary | ICD-10-CM | POA: Diagnosis not present

## 2018-03-06 DIAGNOSIS — Y92414 Local residential or business street as the place of occurrence of the external cause: Secondary | ICD-10-CM | POA: Insufficient documentation

## 2018-03-06 DIAGNOSIS — Z23 Encounter for immunization: Secondary | ICD-10-CM | POA: Diagnosis not present

## 2018-03-06 DIAGNOSIS — Y907 Blood alcohol level of 200-239 mg/100 ml: Secondary | ICD-10-CM | POA: Insufficient documentation

## 2018-03-06 DIAGNOSIS — S60512A Abrasion of left hand, initial encounter: Secondary | ICD-10-CM | POA: Insufficient documentation

## 2018-03-06 DIAGNOSIS — W19XXXA Unspecified fall, initial encounter: Secondary | ICD-10-CM | POA: Diagnosis not present

## 2018-03-06 DIAGNOSIS — Y9389 Activity, other specified: Secondary | ICD-10-CM | POA: Diagnosis not present

## 2018-03-06 DIAGNOSIS — R404 Transient alteration of awareness: Secondary | ICD-10-CM | POA: Diagnosis not present

## 2018-03-06 DIAGNOSIS — F17210 Nicotine dependence, cigarettes, uncomplicated: Secondary | ICD-10-CM | POA: Diagnosis not present

## 2018-03-06 DIAGNOSIS — T07XXXA Unspecified multiple injuries, initial encounter: Secondary | ICD-10-CM

## 2018-03-06 DIAGNOSIS — S00211A Abrasion of right eyelid and periocular area, initial encounter: Secondary | ICD-10-CM | POA: Diagnosis not present

## 2018-03-06 DIAGNOSIS — Y999 Unspecified external cause status: Secondary | ICD-10-CM | POA: Insufficient documentation

## 2018-03-06 DIAGNOSIS — W1830XA Fall on same level, unspecified, initial encounter: Secondary | ICD-10-CM | POA: Diagnosis not present

## 2018-03-06 DIAGNOSIS — S0001XA Abrasion of scalp, initial encounter: Secondary | ICD-10-CM | POA: Diagnosis not present

## 2018-03-06 DIAGNOSIS — S60511A Abrasion of right hand, initial encounter: Secondary | ICD-10-CM | POA: Diagnosis not present

## 2018-03-06 DIAGNOSIS — S199XXA Unspecified injury of neck, initial encounter: Secondary | ICD-10-CM | POA: Diagnosis not present

## 2018-03-06 DIAGNOSIS — F10929 Alcohol use, unspecified with intoxication, unspecified: Secondary | ICD-10-CM

## 2018-03-06 DIAGNOSIS — Z79899 Other long term (current) drug therapy: Secondary | ICD-10-CM | POA: Diagnosis not present

## 2018-03-06 LAB — CBC WITH DIFFERENTIAL/PLATELET
Abs Immature Granulocytes: 0.02 10*3/uL (ref 0.00–0.07)
Basophils Absolute: 0.1 10*3/uL (ref 0.0–0.1)
Basophils Relative: 1 %
Eosinophils Absolute: 0.2 10*3/uL (ref 0.0–0.5)
Eosinophils Relative: 3 %
HCT: 42.3 % (ref 39.0–52.0)
Hemoglobin: 13.6 g/dL (ref 13.0–17.0)
Immature Granulocytes: 0 %
Lymphocytes Relative: 35 %
Lymphs Abs: 2.3 10*3/uL (ref 0.7–4.0)
MCH: 31.5 pg (ref 26.0–34.0)
MCHC: 32.2 g/dL (ref 30.0–36.0)
MCV: 97.9 fL (ref 80.0–100.0)
Monocytes Absolute: 0.5 10*3/uL (ref 0.1–1.0)
Monocytes Relative: 7 %
Neutro Abs: 3.5 10*3/uL (ref 1.7–7.7)
Neutrophils Relative %: 54 %
Platelets: 191 10*3/uL (ref 150–400)
RBC: 4.32 MIL/uL (ref 4.22–5.81)
RDW: 11.9 % (ref 11.5–15.5)
WBC: 6.5 10*3/uL (ref 4.0–10.5)
nRBC: 0 % (ref 0.0–0.2)

## 2018-03-06 LAB — COMPREHENSIVE METABOLIC PANEL
ALT: 17 U/L (ref 0–44)
AST: 23 U/L (ref 15–41)
Albumin: 3.7 g/dL (ref 3.5–5.0)
Alkaline Phosphatase: 68 U/L (ref 38–126)
Anion gap: 11 (ref 5–15)
BUN: 8 mg/dL (ref 8–23)
CO2: 18 mmol/L — ABNORMAL LOW (ref 22–32)
Calcium: 9.1 mg/dL (ref 8.9–10.3)
Chloride: 108 mmol/L (ref 98–111)
Creatinine, Ser: 0.81 mg/dL (ref 0.61–1.24)
GFR calc Af Amer: >60 mL/min (ref >60)
GFR calc non Af Amer: >60 mL/min (ref >60)
Glucose, Bld: 101 mg/dL — ABNORMAL HIGH (ref 70–99)
Potassium: 3.3 mmol/L — ABNORMAL LOW (ref 3.5–5.1)
Sodium: 137 mmol/L (ref 135–145)
Total Bilirubin: 1.2 mg/dL (ref 0.3–1.2)
Total Protein: 7.6 g/dL (ref 6.5–8.1)

## 2018-03-06 LAB — ETHANOL: Alcohol, Ethyl (B): 206 mg/dL — ABNORMAL HIGH (ref <10)

## 2018-03-06 MED ORDER — LIDOCAINE-EPINEPHRINE (PF) 2 %-1:200000 IJ SOLN
20.0000 mL | Freq: Once | INTRAMUSCULAR | Status: DC
  Filled 2018-03-06 (×2): qty 20

## 2018-03-06 MED ORDER — TETANUS-DIPHTH-ACELL PERTUSSIS 5-2.5-18.5 LF-MCG/0.5 IM SUSP
0.5000 mL | Freq: Once | INTRAMUSCULAR | Status: AC
  Administered 2018-03-06: 0.5 mL via INTRAMUSCULAR
  Filled 2018-03-06: qty 0.5

## 2018-03-06 NOTE — ED Notes (Signed)
ED Provider at bedside. 

## 2018-03-06 NOTE — ED Provider Notes (Signed)
[facility] EMERGENCY DEPARTMENT Provider Note   CSN: [id number] Arrival date & time: 03/06/18  2147     History   Chief Complaint Chief Complaint  Patient presents with  . Fall  . Alcohol Intoxication    HPI Ryan Ortega is a 72 y.o. male.  The history is provided by the patient, the EMS personnel and medical records. No language interpreter was used.  Fall   Alcohol Intoxication      72 year old male with history of tobacco and alcohol abuse, chronic hepatitis, brought here via EMS for evaluation of recent fall and alcohol intoxication.  Patient was found to move the road, with apparent alcohol intoxication.  He also has a laceration to his face and hands.  He was unable to give much history.  Patient admits that he went to the store to buy alcohol and was on his way home.  He did recall falling.  He is unable to tell me if he had a syncopal episode.  He is currently denies having any headache, neck pain, chest pain, trouble breathing, abdominal pain, back pain or pain to his extremities.  He is unable to tell me his last tetanus status.  He denies being intoxicated.  He is unable to tell me how much alcohol he has consumed.  He also admits to using marijuana.  History is limited.  Past Medical History:  Diagnosis Date  . Allergy   . Chronic hepatitis C without mention of hepatic coma   . H. pylori infection   . Hearing loss of aging   . Internal hemorrhoids without mention of complication   . Lipoma of unspecified site   . Loss of weight   . Mild cognitive impairment, so stated   . Osteoarthrosis, unspecified whether generalized or localized, unspecified site   . Pain in joint, forearm   . Personal history of colonic adenoma 06/25/2012  . Rash and other nonspecific skin eruption   . Routine general medical examination at a health care facility   . Substance abuse ([place])   . Tinnitus of both ears   . Tobacco use disorder   . Unspecified visual loss      Patient Active Problem List   Diagnosis Date Noted  . Liver fibrosis 07/29/2017  . Vaccine counseling 07/29/2017  . H. pylori infection 02/17/2017  . Screening, lipid 06/20/2016  . Pulmonary nodules/lesions, multiple 11/04/2014  . Tobacco abuse 11/04/2014  . 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 Alcohol abuse 11/04/2014  . 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 Smoking 1/2 pack a day or less 10/04/2013  . Dyspnea on exertion 10/04/2013  . Bilateral leg cramps 10/04/2013  . Chronic hepatitis C without hepatic coma ([place]) 10/04/2013  . Loss of weight   . Personal history of colonic adenoma 06/19/2009    Past Surgical History:  Procedure Laterality Date  . COLONOSCOPY    . left shoulder  1990        Home Medications    Prior to Admission medications   Medication Sig Start Date End Date Taking? Authorizing Provider  mirtazapine (REMERON) 15 MG tablet Take 1 tablet (15 mg total) by mouth at bedtime. 01/22/18   Gayland Curry, DO    Family History Family History  Problem Relation Age of Onset  . Cancer Mother   . Stroke Sister   . Breast cancer Sister   . Colitis Neg Hx   . Esophageal cancer Neg Hx   .  Stomach cancer Neg Hx   . Rectal cancer Neg Hx     Social History Social History   Tobacco Use  . Smoking status: Current Some Day Smoker    Packs/day: 0.25    Years: 40.00    Pack years: 10.00    Types: Cigarettes  . Smokeless tobacco: Never Used  Substance Use Topics  . Alcohol use: Yes    Alcohol/week: 18.0 standard drinks    Types: 18 Cans of beer per week  . Drug use: Yes    Frequency: 5.0 times per week    Types: Marijuana    Comment: smokes marijuna "every now and then during the week"     Allergies   Patient has no known allergies.   Review of Systems Review of Systems  Unable to perform ROS: Mental status change     Physical Exam Updated Vital Signs BP 121/82 (BP Location: Right Arm)    Pulse 91   Temp 98 F (36.7 C) (Oral)   Resp 18   SpO2 94% Comment: Simultaneous filing. User may not have seen previous data.  Physical Exam  Constitutional: He appears well-developed and well-nourished. No distress.  Elderly male, with dried blood noted on his scalp, Kerlix dressing in place around his scalp, wearing c-collar, appears intoxicated.  HENT:  Dried blood noted on face with skin abrasion to R zygomatic arch and inferior R lower eyelid. No hemotympanum, no septal hematoma, no malocclusion, no midface tenderness.  Dark blood noted on tongue.  Eyes: Conjunctivae are normal.  Right eye with reactive pupil, extraocular movements intact.  Left eye is blind.  Neck:  Patient is wearing his cervical collar, no midline spine tenderness.  Cardiovascular: Normal rate and regular rhythm.  Pulmonary/Chest: Effort normal and breath sounds normal.  Abdominal: Soft.  Lipoma or potential ventral hernia noted on abdomen, nontender to palpation.  Musculoskeletal:  Able to move all 4 extremities.  Neurological: He is alert.  Patient is alert and oriented x1.  Speech difficult to understand.  Skin: No rash noted.  Skin abrasions noted to bilateral hands on the palms and fingers.  No deep laceration.  Psychiatric: He has a normal mood and affect.  Nursing note and vitals reviewed.    ED Treatments / Results  Labs (all labs ordered are listed, but only abnormal results are displayed) Labs Reviewed  ETHANOL - Abnormal; Notable for the following components:      Result Value   Alcohol, Ethyl (B) 206 (*)    All other components within normal limits  COMPREHENSIVE METABOLIC PANEL - Abnormal; Notable for the following components:   Potassium 3.3 (*)    CO2 18 (*)    Glucose, Bld 101 (*)    All other components within normal limits  CBC WITH DIFFERENTIAL/PLATELET    EKG None  Radiology Ct Head Wo Contrast  Result Date: 03/06/2018 CLINICAL DATA:  Status post fall, head trauma.  ETOH. EXAM: CT HEAD WITHOUT CONTRAST CT CERVICAL SPINE WITHOUT CONTRAST TECHNIQUE: Multidetector CT imaging of the head and cervical spine was performed following the standard protocol without intravenous contrast. Multiplanar CT image reconstructions of the cervical spine were also generated. COMPARISON:  Head CT dated 11/07/2008. FINDINGS: CT HEAD FINDINGS Brain: Ventricles are within normal limits in size and configuration. Mild chronic small vessel ischemic changes noted within the deep periventricular white matter. There is no mass, hemorrhage, edema or other evidence of acute parenchymal abnormality. No extra-axial hemorrhage. Vascular: Chronic calcified atherosclerotic changes of the large  vessels at the skull base. No unexpected hyperdense vessel. Skull: No skull fracture. Sinuses/Orbits: Paranasal sinuses are clear. Chronic appearing deformity of the medial RIGHT orbital wall. Retro-orbital soft tissues are unremarkable. Other: Soft tissue edema/hematoma overlying the lower RIGHT orbit and RIGHT maxilla. No underlying fracture appreciated. CT CERVICAL SPINE FINDINGS Alignment: Mild dextroscoliosis. No evidence of acute vertebral body subluxation. Skull base and vertebrae: No fracture line or displaced fracture fragment seen. Facet joints appear normally aligned. Soft tissues and spinal canal: No prevertebral fluid or swelling. No visible canal hematoma. Disc levels: Degenerative spondylosis throughout the cervical spine, moderate in degree with associated disc space narrowings and mild osseous spurring. Associated disc-osteophytic bulges at C3-4 and C4-5 causing mild to moderate central canal stenoses. Upper chest: No acute findings. Other: Bilateral carotid atherosclerosis. IMPRESSION: 1. Soft tissue edema/hematoma overlying the lower RIGHT orbit and RIGHT maxilla. No underlying fracture. 2. No acute intracranial abnormality. No intracranial hemorrhage or edema. No skull fracture. 3. No fracture or acute  subluxation within the cervical spine. Degenerative changes of the cervical spine, as detailed above. 4. Carotid atherosclerosis. Electronically Signed   By: Franki Cabot M.D.   On: 03/06/2018 22:43   Ct Cervical Spine Wo Contrast  Result Date: 03/06/2018 CLINICAL DATA:  Status post fall, head trauma. ETOH. EXAM: CT HEAD WITHOUT CONTRAST CT CERVICAL SPINE WITHOUT CONTRAST TECHNIQUE: Multidetector CT imaging of the head and cervical spine was performed following the standard protocol without intravenous contrast. Multiplanar CT image reconstructions of the cervical spine were also generated. COMPARISON:  Head CT dated 11/07/2008. FINDINGS: CT HEAD FINDINGS Brain: Ventricles are within normal limits in size and configuration. Mild chronic small vessel ischemic changes noted within the deep periventricular white matter. There is no mass, hemorrhage, edema or other evidence of acute parenchymal abnormality. No extra-axial hemorrhage. Vascular: Chronic calcified atherosclerotic changes of the large vessels at the skull base. No unexpected hyperdense vessel. Skull: No skull fracture. Sinuses/Orbits: Paranasal sinuses are clear. Chronic appearing deformity of the medial RIGHT orbital wall. Retro-orbital soft tissues are unremarkable. Other: Soft tissue edema/hematoma overlying the lower RIGHT orbit and RIGHT maxilla. No underlying fracture appreciated. CT CERVICAL SPINE FINDINGS Alignment: Mild dextroscoliosis. No evidence of acute vertebral body subluxation. Skull base and vertebrae: No fracture line or displaced fracture fragment seen. Facet joints appear normally aligned. Soft tissues and spinal canal: No prevertebral fluid or swelling. No visible canal hematoma. Disc levels: Degenerative spondylosis throughout the cervical spine, moderate in degree with associated disc space narrowings and mild osseous spurring. Associated disc-osteophytic bulges at C3-4 and C4-5 causing mild to moderate central canal stenoses.  Upper chest: No acute findings. Other: Bilateral carotid atherosclerosis. IMPRESSION: 1. Soft tissue edema/hematoma overlying the lower RIGHT orbit and RIGHT maxilla. No underlying fracture. 2. No acute intracranial abnormality. No intracranial hemorrhage or edema. No skull fracture. 3. No fracture or acute subluxation within the cervical spine. Degenerative changes of the cervical spine, as detailed above. 4. Carotid atherosclerosis. Electronically Signed   By: Franki Cabot M.D.   On: 03/06/2018 22:43    Procedures Procedures (including critical care time)  Medications Ordered in ED Medications  Tdap (BOOSTRIX) injection 0.5 mL (has no administration in time range)     Initial Impression / Assessment and Plan / ED Course  I have reviewed the triage vital signs and the nursing notes.  Pertinent labs & imaging results that were available during my care of the patient were reviewed by me and considered in my medical decision making (see  chart for details).     BP (!) 161/89   Pulse (!) 105   Temp 98 F (36.7 C) (Oral)   Resp 16   SpO2 94%    Final Clinical Impressions(s) / ED Diagnoses   Final diagnoses:  Alcoholic intoxication with complication (HCC)  Contusion of face, initial encounter  Abrasions of multiple sites    ED Discharge Orders    None     10:06 PM Patient with history of alcohol abuse, found laying on the road with apparent fall, he has dried blood noted on face with facial abrasion involving the R zygomatic arch. abrasions to bilateral hands.  He is intoxicated but denies any significant pain.  Pt has dried blood in mouth as well, patient with history of hepatitis C.  Unknown last tetanus status.  Work-up initiated, will clean his wounds and update tdap.    11:03 PM Head and cervical spine CT shows soft tissue edema/hematoma over lying the lower right orbit and right maxillary region but no underlying fracture.  At this time, patient's wife is at bedside.  She  admits patient does drink heavily on a regular basis he did go to the store to buy more beer earlier this evening.  11:51 PM Pt without any deep laceration on the face.  Small skin tear noted in which I applied sterile strip to provide hemostasis.  Wife will take pt home.  He is able to ambulate.  Resources provided.  Encourage alcohol cessation.  Pt is not interested.     Domenic Moras, PA-C 03/07/18 0000    Dorie Rank, MD 03/07/18 [phone]

## 2018-03-06 NOTE — ED Notes (Signed)
Patient transported to CT 

## 2018-03-06 NOTE — ED Triage Notes (Signed)
Pt arrives to ED with complaints of a fall. EMS reports pt had an unwitnessed fall with lacs to face and hands. ETOH on board. Pt was found in the middle of the road. Pt reports no pain. Pt placed in position of comfort with bed locked and lowered, call bell in reach.

## 2018-03-06 NOTE — Discharge Instructions (Signed)
Your fall is likely due to alcohol intoxication.  Please decrease your alcohol consumption.  You have several abrasions to your face and hands.  Cleanse these wounds daily to minimize risk of infection.  Take tylenol or ibuprofen as needed for pain.  Return if you have any concerns.

## 2018-03-07 NOTE — ED Notes (Signed)
Patient verbalizes understanding of discharge instructions. Opportunity for questioning and answers were provided. Armband removed by staff, pt discharged from ED in wheelchair.  

## 2018-04-17 ENCOUNTER — Ambulatory Visit: Payer: Self-pay

## 2018-04-17 ENCOUNTER — Ambulatory Visit (INDEPENDENT_AMBULATORY_CARE_PROVIDER_SITE_OTHER): Payer: Medicare Other | Admitting: Nurse Practitioner

## 2018-04-17 ENCOUNTER — Encounter: Payer: Self-pay | Admitting: Nurse Practitioner

## 2018-04-17 VITALS — BP 124/80 | HR 81 | Temp 97.4°F | Ht 66.0 in | Wt 130.0 lb

## 2018-04-17 DIAGNOSIS — Z1211 Encounter for screening for malignant neoplasm of colon: Secondary | ICD-10-CM

## 2018-04-17 DIAGNOSIS — Z0000 Encounter for general adult medical examination without abnormal findings: Secondary | ICD-10-CM | POA: Diagnosis not present

## 2018-04-17 DIAGNOSIS — Z1212 Encounter for screening for malignant neoplasm of rectum: Secondary | ICD-10-CM

## 2018-04-17 DIAGNOSIS — F17210 Nicotine dependence, cigarettes, uncomplicated: Secondary | ICD-10-CM | POA: Diagnosis not present

## 2018-04-17 MED ORDER — ZOSTER VAC RECOMB ADJUVANTED 50 MCG/0.5ML IM SUSR: 0.5000 mL | Freq: Once | INTRAMUSCULAR | 1 refills | Status: AC

## 2018-04-17 NOTE — Patient Instructions (Addendum)
Mr. Dirr , Thank you for taking time to come for your Medicare Wellness Visit. I appreciate your ongoing commitment to your health goals. Please review the following plan we discussed and let me know if I can assist you in the future.   Screening recommendations/referrals: Colonoscopy - need to make appt with GI doctor Recommended yearly ophthalmology/optometry visit for glaucoma screening and checkup Recommended yearly dental visit for hygiene and checkup  Vaccinations: Influenza vaccine utd Pneumococcal vaccine utd  Tdap vaccine utd Shingles vaccine: NEED  Advanced directives: have family go over information with you  Conditions/risks identified: need to quit smoking, increase activity level   Next appointment: 07/23/2018  Preventive Care 58 Years and Older, Male Preventive care refers to lifestyle choices and visits with your health care provider that can promote health and wellness. What does preventive care include?  A yearly physical exam. This is also called an annual well check.  Dental exams once or twice a year.  Routine eye exams. Ask your health care provider how often you should have your eyes checked.  Personal lifestyle choices, including:  Daily care of your teeth and gums.  Regular physical activity.  Eating a healthy diet.  Avoiding tobacco and drug use.  Limiting alcohol use.  Practicing safe sex.  Taking low doses of aspirin every day.  Taking vitamin and mineral supplements as recommended by your health care provider. What happens during an annual well check? The services and screenings done by your health care provider during your annual well check will depend on your age, overall health, lifestyle risk factors, and family history of disease. Counseling  Your health care provider may ask you questions about your:  Alcohol use.  Tobacco use.  Drug use.  Emotional well-being.  Home and relationship well-being.  Sexual  activity.  Eating habits.  History of falls.  Memory and ability to understand (cognition).  Work and work Statistician. Screening  You may have the following tests or measurements:  Height, weight, and BMI.  Blood pressure.  Lipid and cholesterol levels. These may be checked every 5 years, or more frequently if you are over 54 years old.  Skin check.  Lung cancer screening. You may have this screening every year starting at age 24 if you have a 30-pack-year history of smoking and currently smoke or have quit within the past 15 years.  Fecal occult blood test (FOBT) of the stool. You may have this test every year starting at age 92.  Flexible sigmoidoscopy or colonoscopy. You may have a sigmoidoscopy every 5 years or a colonoscopy every 10 years starting at age 61.  Prostate cancer screening. Recommendations will vary depending on your family history and other risks.  Hepatitis C blood test.  Hepatitis B blood test.  Sexually transmitted disease (STD) testing.  Diabetes screening. This is done by checking your blood sugar (glucose) after you have not eaten for a while (fasting). You may have this done every 1-3 years.  Abdominal aortic aneurysm (AAA) screening. You may need this if you are a current or former smoker.  Osteoporosis. You may be screened starting at age 18 if you are at high risk. Talk with your health care provider about your test results, treatment options, and if necessary, the need for more tests. Vaccines  Your health care provider may recommend certain vaccines, such as:  Influenza vaccine. This is recommended every year.  Tetanus, diphtheria, and acellular pertussis (Tdap, Td) vaccine. You may need a Td booster every 10  years.  Zoster vaccine. You may need this after age 92.  Pneumococcal 13-valent conjugate (PCV13) vaccine. One dose is recommended after age 61.  Pneumococcal polysaccharide (PPSV23) vaccine. One dose is recommended after age  72. Talk to your health care provider about which screenings and vaccines you need and how often you need them. This information is not intended to replace advice given to you by your health care provider. Make sure you discuss any questions you have with your health care provider. Document Released: 04/28/2015 Document Revised: 12/20/2015 Document Reviewed: 01/31/2015 Elsevier Interactive Patient Education  2017 [place] Prevention in the Home Falls can cause injuries. They can happen to people of all ages. There are many things you can do to make your home safe and to help prevent falls. What can I do on the outside of my home?  Regularly fix the edges of walkways and driveways and fix any cracks.  Remove anything that might make you trip as you walk through a door, such as a raised step or threshold.  Trim any bushes or trees on the path to your home.  Use bright outdoor lighting.  Clear any walking paths of anything that might make someone trip, such as rocks or tools.  Regularly check to see if handrails are loose or broken. Make sure that both sides of any steps have handrails.  Any raised decks and porches should have guardrails on the edges.  Have any leaves, snow, or ice cleared regularly.  Use sand or salt on walking paths during winter.  Clean up any spills in your garage right away. This includes oil or grease spills. What can I do in the bathroom?  Use night lights.  Install grab bars by the toilet and in the tub and shower. Do not use towel bars as grab bars.  Use non-skid mats or decals in the tub or shower.  If you need to sit down in the shower, use a plastic, non-slip stool.  Keep the floor dry. Clean up any water that spills on the floor as soon as it happens.  Remove soap buildup in the tub or shower regularly.  Attach bath mats securely with double-sided non-slip rug tape.  Do not have throw rugs and other things on the floor that can make  you trip. What can I do in the bedroom?  Use night lights.  Make sure that you have a light by your bed that is easy to reach.  Do not use any sheets or blankets that are too big for your bed. They should not hang down onto the floor.  Have a firm chair that has side arms. You can use this for support while you get dressed.  Do not have throw rugs and other things on the floor that can make you trip. What can I do in the kitchen?  Clean up any spills right away.  Avoid walking on wet floors.  Keep items that you use a lot in easy-to-reach places.  If you need to reach something above you, use a strong step stool that has a grab bar.  Keep electrical cords out of the way.  Do not use floor polish or wax that makes floors slippery. If you must use wax, use non-skid floor wax.  Do not have throw rugs and other things on the floor that can make you trip. What can I do with my stairs?  Do not leave any items on the stairs.  Make sure that  there are handrails on both sides of the stairs and use them. Fix handrails that are broken or loose. Make sure that handrails are as long as the stairways.  Check any carpeting to make sure that it is firmly attached to the stairs. Fix any carpet that is loose or worn.  Avoid having throw rugs at the top or bottom of the stairs. If you do have throw rugs, attach them to the floor with carpet tape.  Make sure that you have a light switch at the top of the stairs and the bottom of the stairs. If you do not have them, ask someone to add them for you. What else can I do to help prevent falls?  Wear shoes that:  Do not have high heels.  Have rubber bottoms.  Are comfortable and fit you well.  Are closed at the toe. Do not wear sandals.  If you use a stepladder:  Make sure that it is fully opened. Do not climb a closed stepladder.  Make sure that both sides of the stepladder are locked into place.  Ask someone to hold it for you, if  possible.  Clearly mark and make sure that you can see:  Any grab bars or handrails.  First and last steps.  Where the edge of each step is.  Use tools that help you move around (mobility aids) if they are needed. These include:  Canes.  Walkers.  Scooters.  Crutches.  Turn on the lights when you go into a dark area. Replace any light bulbs as soon as they burn out.  Set up your furniture so you have a clear path. Avoid moving your furniture around.  If any of your floors are uneven, fix them.  If there are any pets around you, be aware of where they are.  Review your medicines with your doctor. Some medicines can make you feel dizzy. This can increase your chance of falling. Ask your doctor what other things that you can do to help prevent falls. This information is not intended to replace advice given to you by your health care provider. Make sure you discuss any questions you have with your health care provider. Document Released: 01/26/2009 Document Revised: 09/07/2015 Document Reviewed: 05/06/2014 Elsevier Interactive Patient Education  2017 [place] Maintenance, Male A healthy lifestyle and preventive care is important for your health and wellness. Ask your health care provider about what schedule of regular examinations is right for you. What should I know about weight and diet? Eat a Healthy Diet  Eat plenty of vegetables, fruits, whole grains, low-fat dairy products, and lean protein.  Do not eat a lot of foods high in solid fats, added sugars, or salt.  Maintain a Healthy Weight Regular exercise can help you achieve or maintain a healthy weight. You should:  Do at least 150 minutes of exercise each week. The exercise should increase your heart rate and make you sweat (moderate-intensity exercise).  Do strength-training exercises at least twice a week. Watch Your Levels of Cholesterol and Blood Lipids  Have your blood tested for lipids and  cholesterol every 5 years starting at 74 years of age. If you are at high risk for heart disease, you should start having your blood tested when you are 74 years old. You may need to have your cholesterol levels checked more often if: ? Your lipid or cholesterol levels are high. ? You are older than 74 years of age. ? You are  at high risk for heart disease. What should I know about cancer screening? Many types of cancers can be detected early and may often be prevented. Lung Cancer  You should be screened every year for lung cancer if: ? You are a current smoker who has smoked for at least 30 years. ? You are a former smoker who has quit within the past 15 years.  Talk to your health care provider about your screening options, when you should start screening, and how often you should be screened. Colorectal Cancer  Routine colorectal cancer screening usually begins at 74 years of age and should be repeated every 5-10 years until you are 74 years old. You may need to be screened more often if early forms of precancerous polyps or small growths are found. Your health care provider may recommend screening at an earlier age if you have risk factors for colon cancer.  Your health care provider may recommend using home test kits to check for hidden blood in the stool.  A small camera at the end of a tube can be used to examine your colon (sigmoidoscopy or colonoscopy). This checks for the earliest forms of colorectal cancer. Prostate and Testicular Cancer  Depending on your age and overall health, your health care provider may do certain tests to screen for prostate and testicular cancer.  Talk to your health care provider about any symptoms or concerns you have about testicular or prostate cancer. Skin Cancer  Check your skin from head to toe regularly.  Tell your health care provider about any new moles or changes in moles, especially if: ? There is a change in a mole's size, shape, or  color. ? You have a mole that is larger than a pencil eraser.  Always use sunscreen. Apply sunscreen liberally and repeat throughout the day.  Protect yourself by wearing long sleeves, pants, a wide-brimmed hat, and sunglasses when outside. What should I know about heart disease, diabetes, and high blood pressure?  If you are 74-3 years of age, have your blood pressure checked every 3-5 years. If you are 61 years of age or older, have your blood pressure checked every year. You should have your blood pressure measured twice-once when you are at a hospital or clinic, and once when you are not at a hospital or clinic. Record the average of the two measurements. To check your blood pressure when you are not at a hospital or clinic, you can use: ? An automated blood pressure machine at a pharmacy. ? A home blood pressure monitor.  Talk to your health care provider about your target blood pressure.  If you are between 49-24 years old, ask your health care provider if you should take aspirin to prevent heart disease.  Have regular diabetes screenings by checking your fasting blood sugar level. ? If you are at a normal weight and have a low risk for diabetes, have this test once every three years after the age of 13. ? If you are overweight and have a high risk for diabetes, consider being tested at a younger age or more often.  A one-time screening for abdominal aortic aneurysm (AAA) by ultrasound is recommended for men aged 29-75 years who are current or former smokers. What should I know about preventing infection? Hepatitis B If you have a higher risk for hepatitis B, you should be screened for this virus. Talk with your health care provider to find out if you are at risk for hepatitis B infection.  Hepatitis C Blood testing is recommended for:  Everyone born from [birth year] through 1966.  Anyone with known risk factors for hepatitis C. Sexually Transmitted Diseases (STDs)  You should be  screened each year for STDs including gonorrhea and chlamydia if: ? You are sexually active and are younger than 74 years of age. ? You are older than 74 years of age and your health care provider tells you that you are at risk for this type of infection. ? Your sexual activity has changed since you were last screened and you are at an increased risk for chlamydia or gonorrhea. Ask your health care provider if you are at risk.  Talk with your health care provider about whether you are at high risk of being infected with HIV. Your health care provider may recommend a prescription medicine to help prevent HIV infection. What else can I do?  Schedule regular health, dental, and eye exams.  Stay current with your vaccines (immunizations).  Do not use any tobacco products, such as cigarettes, chewing tobacco, and e-cigarettes. If you need help quitting, ask your health care provider.  Limit alcohol intake to no more than 2 drinks per day. One drink equals 12 ounces of beer, 5 ounces of wine, or 1 ounces of hard liquor.  Do not use street drugs.  Do not share needles.  Ask your health care provider for help if you need support or information about quitting drugs.  Tell your health care provider if you often feel depressed.  Tell your health care provider if you have ever been abused or do not feel safe at home. This information is not intended to replace advice given to you by your health care provider. Make sure you discuss any questions you have with your health care provider. Document Released: 09/28/2007 Document Revised: 11/29/2015 Document Reviewed: 01/03/2015 Elsevier Interactive Patient Education  2019 [employer].

## 2018-04-17 NOTE — Progress Notes (Signed)
Subjective:   Ryan Ortega is a 73 y.o. male who presents for Medicare Annual/Subsequent preventive examination.  Review of Systems:  Cardiac Risk Factors include: advanced age (>36men, >55 women);sedentary lifestyle;smoking/ tobacco exposure     Objective:    Vitals: BP 124/80   Pulse 81   Temp (!) 97.4 F (36.3 C) (Oral)   Ht 5\' 6"  (1.676 m)   Wt 130 lb (59 kg)   SpO2 97%   BMI 20.98 kg/m   Body mass index is 20.98 kg/m.  Filed Weights   04/17/18 1114  Weight: 130 lb (59 kg)    Advanced Directives 04/17/2018 04/17/2018 09/18/2017 05/20/2017 04/14/2017 02/17/2017 01/29/2017  Does Patient Have a Medical Advance Directive? - No No No No No No  Would patient like information on creating a medical advance directive? Yes (MAU/Ambulatory/Procedural Areas - Information given) No - Patient declined No - Patient declined No - Patient declined No - Patient declined No - Patient declined No - Patient declined  Pre-existing out of facility DNR order (yellow form or pink MOST form) - - - - - - -    Tobacco Social History   Tobacco Use  Smoking Status Current Some Day Smoker  . Packs/day: 0.25  . Years: 40.00  . Pack years: 10.00  . Types: Cigarettes  Smokeless Tobacco Never Used     Ready to quit: Not Answered Counseling given: Not Answered   Clinical Intake:     Pain : 0-10 Pain Score: 5  Pain Type: Acute pain Pain Location: Foot Pain Orientation: Right Pain Descriptors / Indicators: Aching Pain Onset: In the past 7 days Pain Frequency: Occasional     BMI - recorded: 20.98 Nutritional Status: BMI of 19-24  Normal Diabetes: No  How often do you need to have someone help you when you read instructions, pamphlets, or other written materials from your doctor or pharmacy?: 5 - Always(unable to read or write) What is the last grade level you completed in school?: 5th grade  Interpreter Needed?: No     Past Medical History:  Diagnosis Date  . Allergy   . Chronic  hepatitis C without mention of hepatic coma   . H. pylori infection   . Hearing loss of aging   . Internal hemorrhoids without mention of complication   . Lipoma of unspecified site   . Loss of weight   . Mild cognitive impairment, so stated   . Osteoarthrosis, unspecified whether generalized or localized, unspecified site   . Pain in joint, forearm   . Personal history of colonic adenoma 06/25/2012  . Rash and other nonspecific skin eruption   . Routine general medical examination at a health care facility   . Substance abuse ([place])   . Tinnitus of both ears   . Tobacco use disorder   . Unspecified visual loss    Past Surgical History:  Procedure Laterality Date  . COLONOSCOPY    . left shoulder  1990   Family History  Problem Relation Age of Onset  . Cancer Mother   . Stroke Sister   . Breast cancer Sister   . Colitis Neg Hx   . Esophageal cancer Neg Hx   . Stomach cancer Neg Hx   . Rectal cancer Neg Hx    Social History   Socioeconomic History  . Marital status: Married    Spouse name: Not on file  . Number of children: 4  . Years of education: Not on file  .  Highest education level: Not on file  Occupational History  . Occupation: retired    Fish farm manager: Advertising copywriter  Social Needs  . Financial resource strain: Not hard at all  . Food insecurity:    Worry: Never true    Inability: Never true  . Transportation needs:    Medical: No    Non-medical: No  Tobacco Use  . Smoking status: Current Some Day Smoker    Packs/day: 0.25    Years: 40.00    Pack years: 10.00    Types: Cigarettes  . Smokeless tobacco: Never Used  Substance and Sexual Activity  . Alcohol use: Yes    Alcohol/week: 18.0 standard drinks    Types: 18 Cans of beer per week  . Drug use: Yes    Frequency: 5.0 times per week    Types: Marijuana    Comment: smokes marijuna "every now and then during the week"  . Sexual activity: Yes  Lifestyle  . Physical activity:    Days per week: 0 days     Minutes per session: 0 min  . Stress: Not at all  Relationships  . Social connections:    Talks on phone: More than three times a week    Gets together: More than three times a week    Attends religious service: Never    Active member of club or organization: No    Attends meetings of clubs or organizations: Never    Relationship status: Married  Other Topics Concern  . Not on file  Social History Narrative   He is married with 4 children   He is retired from concrete work   Drinks about 18 cans of beer a week   Admits to using some marijuana   He is an intermittent smoker   04/07/2017    Outpatient Encounter Medications as of 04/17/2018  Medication Sig  . mirtazapine (REMERON) 15 MG tablet Take 1 tablet (15 mg total) by mouth at bedtime.   No facility-administered encounter medications on file as of 04/17/2018.     Activities of Daily Living In your present state of health, do you have any difficulty performing the following activities: 04/17/2018  Hearing? N  Vision? N  Difficulty concentrating or making decisions? Y  Walking or climbing stairs? N  Dressing or bathing? N  Doing errands, shopping? Y  Comment does not Physiological scientist and eating ? N  Using the Toilet? N  In the past six months, have you accidently leaked urine? N  Do you have problems with loss of bowel control? N  Managing your Medications? N  Managing your Finances? N  Housekeeping or managing your Housekeeping? N  Some recent data might be hidden    Patient Care Team: Gayland Curry, DO as PCP - General (Geriatric Medicine) Gayland Curry, DO (Geriatric Medicine)   Assessment:   This is a routine wellness examination for Ryan Ortega.  Exercise Activities and Dietary recommendations Current Exercise Habits: The patient does not participate in regular exercise at present, Exercise limited by: None identified  Goals    . <enter goal here>     Starting 04/12/16, I will maintain my current  lifestyle.     Marland Kitchen DIET - INCREASE WATER INTAKE     Patient will increase water to 3-4 botles a day     . Quit Smoking     Would like to cut down to 1 pack of cigarettes to last 2 weeks.  Fall Risk Fall Risk  04/17/2018 01/22/2018 09/18/2017 07/08/2017 04/14/2017  Falls in the past year? 0 No No No No  Number falls in past yr: 0 - - - -  Injury with Fall? 0 - - - -   Is the patient's home free of loose throw rugs in walkways, pet beds, electrical cords, etc?   no      Grab bars in the bathroom? no      Handrails on the stairs?   no no stairs       Adequate lighting?   yes  MMSE - Mini Mental State Exam 04/17/2018 04/17/2018 04/14/2017  Not completed: - - -  Orientation to time 4 4 5   Orientation to Place 4 4 4   Registration 3 3 3   Attention/ Calculation 0 - 0  Attention/Calculation-comments patient can not read - -  Recall 1 1 0  Language- name 2 objects 2 2 2   Language- repeat 0 0 1  Language- follow 3 step command 3 3 2   Language- read & follow direction 0 - 0  Language-read & follow direction-comments patient can not read or write - -  Write a sentence 0 - 0  Write a sentence-comments patient can not read or write - -  Copy design 1 1 0  Total score 18 - 17   Depression Screen PHQ 2/9 Scores 04/17/2018 01/22/2018 09/18/2017 07/08/2017  PHQ - 2 Score 0 0 0 0    Cognitive Function MMSE - Mini Mental State Exam 04/17/2018 04/17/2018 04/14/2017 04/12/2016  Not completed: - - - (No Data)  Orientation to time 4 4 5 5   Orientation to Place 4 4 4 4   Registration 3 3 3 3   Attention/ Calculation 0 - 0 0  Attention/Calculation-comments patient can not read - - -  Recall 1 1 0 3  Language- name 2 objects 2 2 2 2   Language- repeat 0 0 1 1  Language- follow 3 step command 3 3 2 2   Language- read & follow direction 0 - 0 0  Language-read & follow direction-comments patient can not read or write - - -  Write a sentence 0 - 0 0  Write a sentence-comments patient can not read or write  - - -  Copy design 1 1 0 0  Total score 18 - 17 20        Immunization History  Administered Date(s) Administered  . DTaP 07/08/1955  . Hepatitis A 09/05/2014  . Hepatitis B 09/05/2014  . Hepatitis B, adult 09/03/2017, 02/11/2018  . Hepatitis B, ped/adol 07/29/2017  . Influenza, High Dose Seasonal PF 02/17/2017, 01/22/2018  . Influenza,inj,Quad PF,6+ Mos 03/24/2014, 03/17/2015, 12/21/2015  . Pneumococcal Conjugate-13 06/24/2014  . Pneumococcal Polysaccharide-23 07/07/2009, 08/03/2012  . Td 07/08/1951  . Tdap 05/17/2013, 03/06/2018  . Varicella 07/07/1957  . Zoster 07/07/2004    Qualifies for Shingles Vaccine? yes  Screening Tests Health Maintenance  Topic Date Due  . COLONOSCOPY  08/31/2017  . TETANUS/TDAP  03/06/2028  . INFLUENZA VACCINE  Completed  . Hepatitis C Screening  Completed  . PNA vac Low Risk Adult  Completed   Cancer Screenings: Lung: Low Dose CT Chest recommended if Age 58-80 years, 30 pack-year currently smoking OR have quit w/in 15years. Patient does qualify. Colorectal: due- referral place   Additional Screenings:  Hepatitis C Screening:done Due for colonoscopy -referral placed  CT lung, screening for lung cancer- referral placed    Plan:     I have personally reviewed and  noted the following in the patient's chart:   . Medical and social history . Use of alcohol, tobacco or illicit drugs  . Current medications and supplements . Functional ability and status . Nutritional status . Physical activity . Advanced directives . List of other physicians . Hospitalizations, surgeries, and ER visits in previous 12 months . Vitals . Screenings to include cognitive, depression, and falls . Referrals and appointments  In addition, I have reviewed and discussed with patient certain preventive protocols, quality metrics, and best practice recommendations. A written personalized care plan for preventive services as well as general preventive health  recommendations were provided to patient.     Lauree Chandler, NP  04/17/2018

## 2018-04-18 ENCOUNTER — Other Ambulatory Visit: Payer: Self-pay | Admitting: Podiatry

## 2018-04-18 ENCOUNTER — Ambulatory Visit: Payer: Medicare Other | Admitting: Podiatry

## 2018-04-18 ENCOUNTER — Encounter: Payer: Self-pay | Admitting: Podiatry

## 2018-04-18 ENCOUNTER — Ambulatory Visit (INDEPENDENT_AMBULATORY_CARE_PROVIDER_SITE_OTHER): Payer: Medicare Other

## 2018-04-18 VITALS — BP 151/98 | HR 83

## 2018-04-18 DIAGNOSIS — B351 Tinea unguium: Secondary | ICD-10-CM | POA: Diagnosis not present

## 2018-04-18 DIAGNOSIS — M79674 Pain in right toe(s): Secondary | ICD-10-CM | POA: Diagnosis not present

## 2018-04-18 DIAGNOSIS — M779 Enthesopathy, unspecified: Principal | ICD-10-CM

## 2018-04-18 DIAGNOSIS — M10071 Idiopathic gout, right ankle and foot: Secondary | ICD-10-CM | POA: Diagnosis not present

## 2018-04-18 DIAGNOSIS — M778 Other enthesopathies, not elsewhere classified: Secondary | ICD-10-CM

## 2018-04-18 DIAGNOSIS — M7751 Other enthesopathy of right foot: Secondary | ICD-10-CM

## 2018-04-18 DIAGNOSIS — M79675 Pain in left toe(s): Secondary | ICD-10-CM

## 2018-04-18 DIAGNOSIS — M7750 Other enthesopathy of unspecified foot: Secondary | ICD-10-CM

## 2018-04-18 MED ORDER — COLCHICINE 0.6 MG PO TABS: 0.6000 mg | ORAL_TABLET | Freq: Every day | ORAL | 0 refills | Status: DC

## 2018-04-18 NOTE — Patient Instructions (Signed)

## 2018-04-20 NOTE — Progress Notes (Signed)
Subjective:   Patient ID: Ryan Ortega, male   DOB: 73 y.o.   MRN: [id number]   HPI 73 year old male presents the office today for concerns of painful right big toe joint with swelling and redness which is been ongoing intermittently for the last 4 months.  He states that most recently is been going on for the last couple of weeks.  He denies any recent injury or trauma to the area.  Denies any history of gout.  He said no recent treatment for this.  States is difficult to put shoes on because of the swelling.  He is not able to give a good history.  Also his nails are very long causing pressure and pain.  Denies any redness or drainage from the toenail sites.   Review of Systems  All other systems reviewed and are negative.  Past Medical History:  Diagnosis Date  . Allergy   . Chronic hepatitis C without mention of hepatic coma   . H. pylori infection   . Hearing loss of aging   . Internal hemorrhoids without mention of complication   . Lipoma of unspecified site   . Loss of weight   . Mild cognitive impairment, so stated   . Osteoarthrosis, unspecified whether generalized or localized, unspecified site   . Pain in joint, forearm   . Personal history of colonic adenoma 06/25/2012  . Rash and other nonspecific skin eruption   . Routine general medical examination at a health care facility   . Substance abuse ([place])   . Tinnitus of both ears   . Tobacco use disorder   . Unspecified visual loss     Past Surgical History:  Procedure Laterality Date  . COLONOSCOPY    . left shoulder  1990     Current Outpatient Medications:  .  colchicine 0.6 MG tablet, Take 1 tablet (0.6 mg total) by mouth daily., Disp: 7 tablet, Rfl: 0 .  mirtazapine (REMERON) 15 MG tablet, Take 1 tablet (15 mg total) by mouth at bedtime., Disp: 90 tablet, Rfl: 3  No Known Allergies       Objective:  Physical Exam  General: AAO x3, NAD  Dermatological: Nails are very elongated, hypertrophic,  dystrophic, brittle, discolored 10. No surrounding redness or drainage. Tenderness nails 1-5 bilaterally. No open lesions or pre-ulcerative lesions are identified today.   Vascular: Dorsalis Pedis artery and Posterior Tibial artery pedal pulses are 2/4 bilateral with immedate capillary fill time. There is no pain with calf compression, swelling, warmth, erythema.   Neruologic: Grossly intact via light touch bilateral. . Protective threshold with Semmes Wienstein monofilament intact to all pedal sites bilateral.   Musculoskeletal: There is edema and erythema mostly to the right first MPJ.  There is pain with MPJ range of motion.  There is no area pinpoint bony tenderness otherwise.  There is no pain on the other metatarsals particular there is no pain on the second metatarsal.  Muscular strength 5/5 in all groups tested bilateral.  Gait: Unassisted, Nonantalgic.       Assessment:   Right foot capsulitis, likely gout; symptomatic onychomycosis    Plan:  -Treatment options discussed including all alternatives, risks, and complications -Etiology of symptoms were discussed -X-rays were obtained and reviewed with the patient.  Old fracture present the second metatarsal but no other evidence of acute fracture identified today. -Steroid injection performed to the right first MPJ.  See procedure note below.  Prescribed colchicine.  Discussed the importance of hydration. -Nails  were sharp debrided x10 without any complications or bleeding.  Procedure: Injection small joint Discussed alternatives, risks, complications and verbal consent was obtained.  Location: Right first MPJ Skin Prep: Betadine. Injectate: 0.5cc 0.5% marcaine plain,  0.5 cc kenalog 10. Disposition: Patient tolerated procedure well. Injection site dressed with a band-aid.  Post-injection care was discussed and return precautions discussed.   Trula Slade DPM

## 2018-04-30 ENCOUNTER — Ambulatory Visit: Payer: Medicare Other | Admitting: Podiatry

## 2018-04-30 DIAGNOSIS — M10071 Idiopathic gout, right ankle and foot: Secondary | ICD-10-CM | POA: Diagnosis not present

## 2018-04-30 DIAGNOSIS — M7751 Other enthesopathy of right foot: Secondary | ICD-10-CM | POA: Diagnosis not present

## 2018-05-03 DIAGNOSIS — M10071 Idiopathic gout, right ankle and foot: Secondary | ICD-10-CM | POA: Insufficient documentation

## 2018-05-03 NOTE — Progress Notes (Signed)
Subjective: 73 year old male presents the office for follow-up evaluation of gout to the right foot.  Since I last saw him he states that he is had resolution of pain he is currently not experiencing any pain or swelling.  He has no new issues.  The colchicine was effective he states the injection also helped quite a bit without any side effects. Denies any systemic complaints such as fevers, chills, nausea, vomiting. No acute changes since last appointment, and no other complaints at this time.   Objective: AAO x3, NAD DP/PT pulses palpable bilaterally, CRT less than 3 seconds This time there is no significant edema to the bilateral lower extremities there is no area of tenderness.  Specifically in the right first MPJ where he is having discomfort previously this has resolved.  No open lesions or pre-ulcerative lesions.  No pain with calf compression, swelling, warmth, erythema  Assessment: Resolving gout right foot  Plan: -All treatment options discussed with the patient including all alternatives, risks, complications.  -Symptoms have resolved.  Discussed measures to help prevent recurrence. -Follow-up as needed. -Patient encouraged to call the office with any questions, concerns, change in symptoms.   Trula Slade DPM

## 2018-05-07 ENCOUNTER — Encounter: Payer: Self-pay | Admitting: Internal Medicine

## 2018-05-27 ENCOUNTER — Ambulatory Visit (AMBULATORY_SURGERY_CENTER): Payer: Self-pay | Admitting: *Deleted

## 2018-05-27 VITALS — Ht 66.0 in | Wt 141.0 lb

## 2018-05-27 DIAGNOSIS — Z86010 Personal history of colonic polyps: Secondary | ICD-10-CM

## 2018-05-27 NOTE — Progress Notes (Signed)
No egg or soy allergy known to patient  No issues with past sedation with any surgeries  or procedures, no intubation problems  No diet pills per patient No home 02 use per patient  No blood thinners per patient  Pt denies issues with constipation  No A fib or A flutter  EMMI video sent to pt's e mail pt has no e mail  Wife in [place] with pt today- we discussed prep instructions several times

## 2018-05-28 DIAGNOSIS — H2511 Age-related nuclear cataract, right eye: Secondary | ICD-10-CM | POA: Diagnosis not present

## 2018-05-28 DIAGNOSIS — H5201 Hypermetropia, right eye: Secondary | ICD-10-CM | POA: Diagnosis not present

## 2018-05-28 DIAGNOSIS — H2702 Aphakia, left eye: Secondary | ICD-10-CM | POA: Diagnosis not present

## 2018-05-28 DIAGNOSIS — H524 Presbyopia: Secondary | ICD-10-CM | POA: Diagnosis not present

## 2018-05-29 ENCOUNTER — Encounter: Payer: Self-pay | Admitting: Internal Medicine

## 2018-06-12 ENCOUNTER — Ambulatory Visit (AMBULATORY_SURGERY_CENTER): Payer: Medicare Other | Admitting: Internal Medicine

## 2018-06-12 ENCOUNTER — Encounter: Payer: Self-pay | Admitting: Internal Medicine

## 2018-06-12 VITALS — BP 129/86 | HR 68 | Temp 98.6°F | Resp 13 | Ht 66.0 in | Wt 141.0 lb

## 2018-06-12 DIAGNOSIS — Z1211 Encounter for screening for malignant neoplasm of colon: Secondary | ICD-10-CM | POA: Diagnosis not present

## 2018-06-12 DIAGNOSIS — Z86010 Personal history of colonic polyps: Secondary | ICD-10-CM | POA: Diagnosis not present

## 2018-06-12 MED ORDER — SODIUM CHLORIDE 0.9 % IV SOLN: 500.0000 mL | INTRAVENOUS | Status: DC

## 2018-06-12 NOTE — Patient Instructions (Addendum)
   No polyps or cancer seen.  I do not recommend another routine colonoscopy.  I appreciate the opportunity to care for you. Gatha Mayer, MD, [facility]  Resume previous diet. Continue present medications.  No repeat colonoscopy due to age.  YOU HAD AN ENDOSCOPIC PROCEDURE TODAY AT [facility]:   Refer to the procedure report that was given to you for any specific questions about what was found during the examination.  If the procedure report does not answer your questions, please call your gastroenterologist to clarify.  If you requested that your care partner not be given the details of your procedure findings, then the procedure report has been included in a sealed envelope for you to review at your convenience later.  YOU SHOULD EXPECT: Some feelings of bloating in the abdomen. Passage of more gas than usual.  Walking can help get rid of the air that was put into your GI tract during the procedure and reduce the bloating. If you had a lower endoscopy (such as a colonoscopy or flexible sigmoidoscopy) you may notice spotting of blood in your stool or on the toilet paper. If you underwent a bowel prep for your procedure, you may not have a normal bowel movement for a few days.  Please Note:  You might notice some irritation and congestion in your nose or some drainage.  This is from the oxygen used during your procedure.  There is no need for concern and it should clear up in a day or so.  SYMPTOMS TO REPORT IMMEDIATELY:   Following lower endoscopy (colonoscopy or flexible sigmoidoscopy):  Excessive amounts of blood in the stool  Significant tenderness or worsening of abdominal pains  Swelling of the abdomen that is new, acute  Fever of 100F or higher  For urgent or emergent issues, a gastroenterologist can be reached at any hour by calling [phone].   DIET:  We do recommend a small meal at first, but then you may proceed to your regular diet.  Drink plenty  of fluids but you should avoid alcoholic beverages for 24 hours.  ACTIVITY:  You should plan to take it easy for the rest of today and you should NOT DRIVE or use heavy machinery until tomorrow (because of the sedation medicines used during the test).    FOLLOW UP: Our staff will call the number listed on your records the next business day following your procedure to check on you and address any questions or concerns that you may have regarding the information given to you following your procedure. If we do not reach you, we will leave a message.  However, if you are feeling well and you are not experiencing any problems, there is no need to return our call.  We will assume that you have returned to your regular daily activities without incident.  If any biopsies were taken you will be contacted by phone or by letter within the next 1-3 weeks.  Please call us at [phone] if you have not heard about the biopsies in 3 weeks.    SIGNATURES/CONFIDENTIALITY: You and/or your care partner have signed paperwork which will be entered into your electronic medical record.  These signatures attest to the fact that that the information above on your After Visit Summary has been reviewed and is understood.  Full responsibility of the confidentiality of this discharge information lies with you and/or your care-partner.

## 2018-06-12 NOTE — Progress Notes (Signed)
To PACU, VSS. Report to [email or web address] 

## 2018-06-12 NOTE — Op Note (Signed)
[name] Procedure Date: 06/12/2018 1:28 PM MRN: [id number] Endoscopist: Gatha Mayer , MD Age: 73 Referring MD:  Date of Birth: [birth year] Gender: Male Account #: [id number] Procedure:                Colonoscopy Indications:              Surveillance: Personal history of adenomatous                            polyps on last colonoscopy 5 years ago Medicines:                Propofol per Anesthesia, Monitored Anesthesia Care Procedure:                Pre-Anesthesia Assessment:                           - Prior to the procedure, a History and Physical                            was performed, and patient medications and                            allergies were reviewed. The patient's tolerance of                            previous anesthesia was also reviewed. The risks                            and benefits of the procedure and the sedation                            options and risks were discussed with the patient.                            All questions were answered, and informed consent                            was obtained. Prior Anticoagulants: The patient has                            taken no previous anticoagulant or antiplatelet                            agents. ASA Grade Assessment: II - A patient with                            mild systemic disease. After reviewing the risks                            and benefits, the patient was deemed in                            satisfactory condition to undergo the procedure.  After obtaining informed consent, the colonoscope                            was passed under direct vision. Throughout the                            procedure, the patient's blood pressure, pulse, and                            oxygen saturations were monitored continuously. The                            Colonoscope was introduced through the anus and   advanced to the the cecum, identified by                            appendiceal orifice and ileocecal valve. The                            colonoscopy was performed without difficulty. The                            patient tolerated the procedure well. The quality                            of the bowel preparation was good. The ileocecal                            valve, appendiceal orifice, and rectum were                            photographed. The bowel preparation used was                            Miralax. Scope In: 1:37:16 PM Scope Out: 1:48:30 PM Scope Withdrawal Time: 0 hours 7 minutes 17 seconds  Total Procedure Duration: 0 hours 11 minutes 14 seconds  Findings:                 The perianal and digital rectal examinations were                            normal.                           Scattered large-mouthed diverticula were found in                            the right colon.                           External and internal hemorrhoids were found during                            retroflexion.  The exam was otherwise without abnormality on                            direct and retroflexion views. Complications:            No immediate complications. Estimated Blood Loss:     Estimated blood loss: none. Impression:               - Diverticulosis in the right colon.                           - External and internal hemorrhoids.                           - The examination was otherwise normal on direct                            and retroflexion views.                           - No specimens collected.                           - Personal history of colonic polyps. Recommendation:           - Patient has a contact number available for                            emergencies. The signs and symptoms of potential                            delayed complications were discussed with the                            patient. Return to normal activities  tomorrow.                            Written discharge instructions were provided to the                            patient.                           - Resume previous diet.                           - Continue present medications.                           - No repeat colonoscopy due to age. Gatha Mayer, MD 06/12/2018 2:00:25 PM This report has been signed electronically.

## 2018-06-15 ENCOUNTER — Telehealth: Payer: Self-pay

## 2018-06-15 NOTE — Telephone Encounter (Signed)
  Follow up Call-  Call back number 06/12/2018  Post procedure Call Back phone  # [phone]  Permission to leave phone message Yes  Some recent data might be hidden     Patient questions:  Do you have a fever, pain , or abdominal swelling? No. Pain Score  0 *  Have you tolerated food without any problems? Yes.    Have you been able to return to your normal activities? Yes.    Do you have any questions about your discharge instructions: Diet   No. Medications  No. Follow up visit  No.  Do you have questions or concerns about your Care? No.  Actions: * If pain score is 4 or above: No action needed, pain <4.

## 2018-07-23 ENCOUNTER — Ambulatory Visit: Payer: Medicare Other | Admitting: Internal Medicine

## 2018-08-25 IMAGING — CT CT ABD-PELV W/ CM
2 of 4 series · 16 of 46 positions shown, 18 images · IV contrast (Omni 300)
Comparison: CT chest 09/19/2015

CLINICAL DATA: Right-sided abdominal pain, 2 weeks duration.

EXAM:
CT ABDOMEN AND PELVIS WITH CONTRAST
TECHNIQUE: Multidetector CT imaging of the abdomen and pelvis was performed
using the standard protocol following bolus administration of
intravenous contrast.
CONTRAST:  100mL ZCHIFN-ZJJ IOPAMIDOL (ZCHIFN-ZJJ) INJECTION 61%

[Series 3: a/p w/ 5mm · axial · 0.71mm/px · z∈[-165,+260]mm · 13 of 93 slices shown, 15 images]
[im 4/93  soft-tissue]
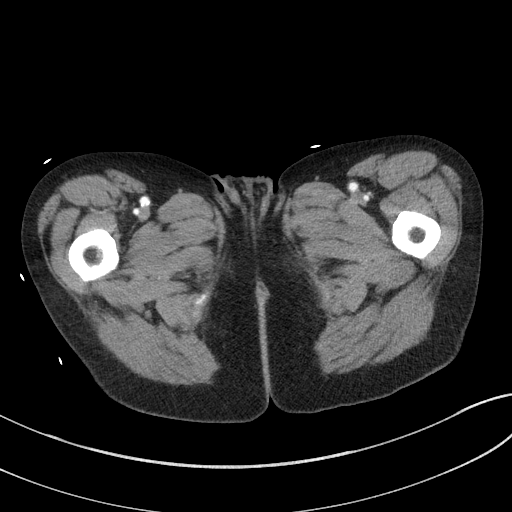
[im 4/93  bone]
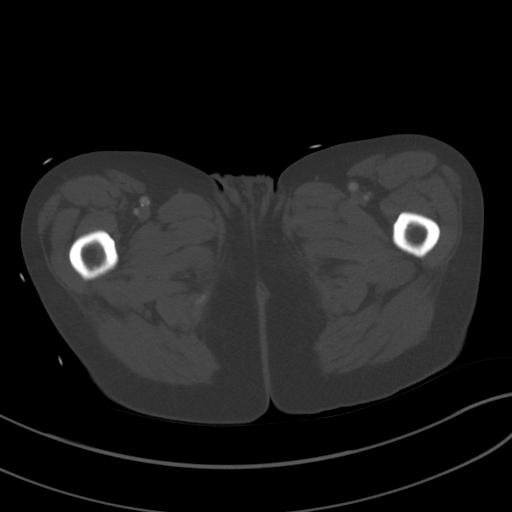
[im 12/93  soft-tissue]
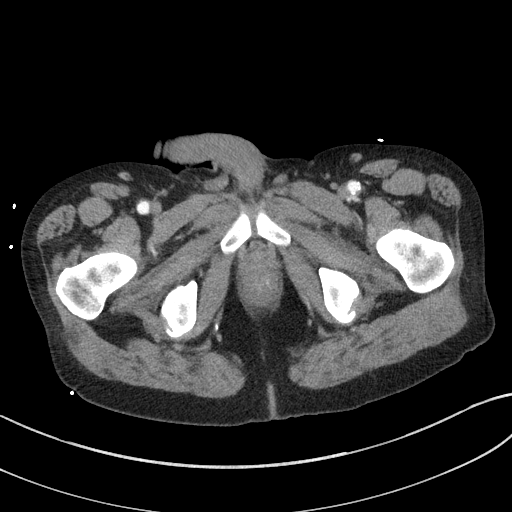
[im 20/93  soft-tissue]
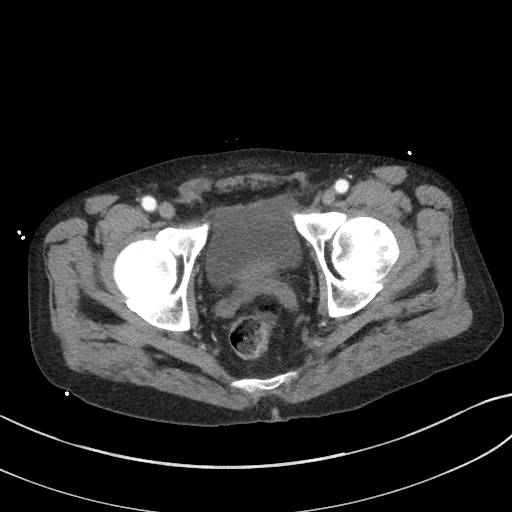
[im 27/93  soft-tissue]
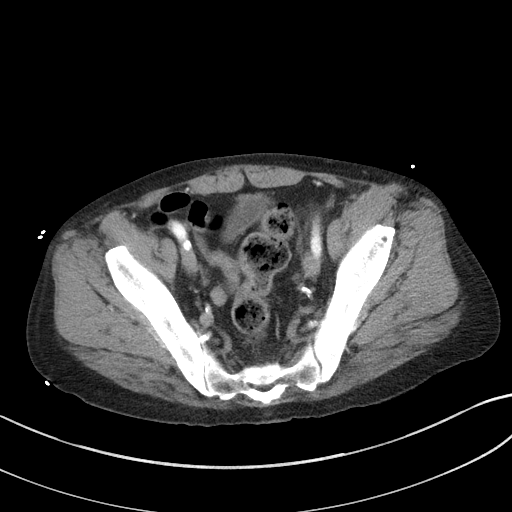
[im 31/93  soft-tissue]
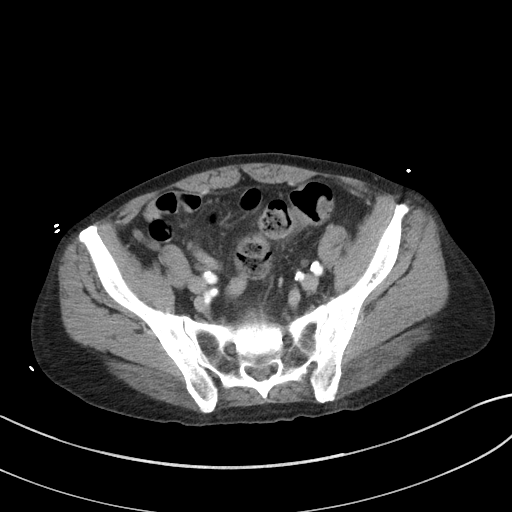
[im 39/93  soft-tissue]
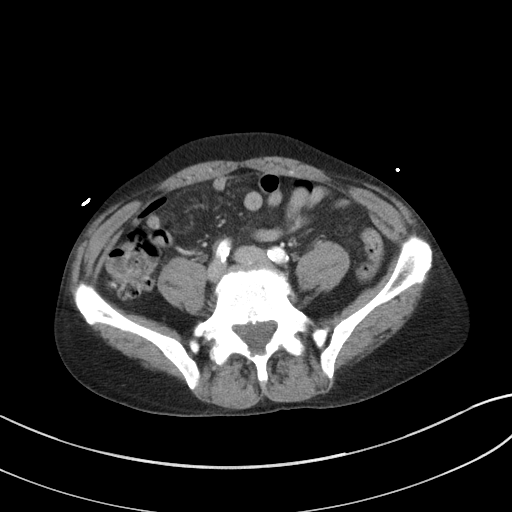
[im 47/93  soft-tissue]
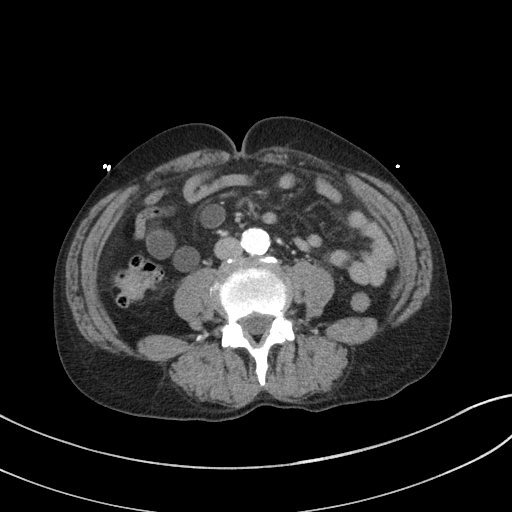
[im 54/93  soft-tissue]
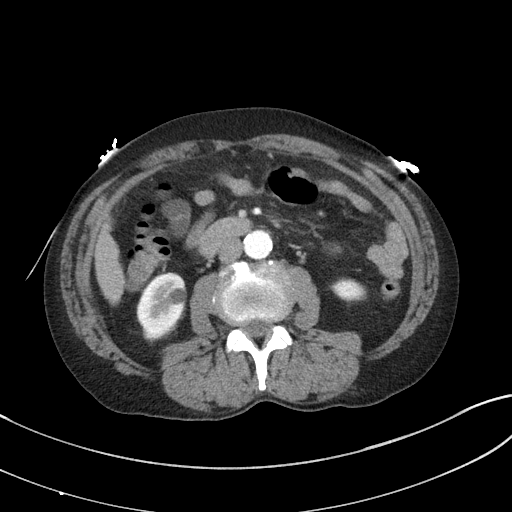
[im 62/93  soft-tissue]
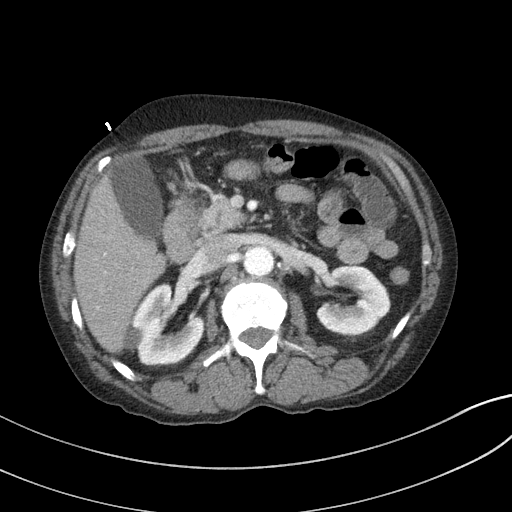
[im 62/93  bone]
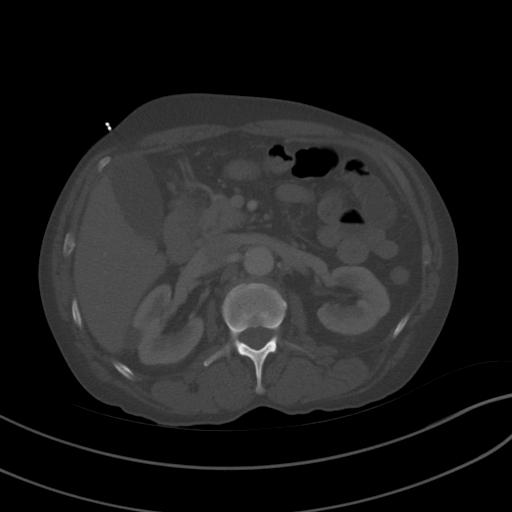
[im 66/93  soft-tissue]
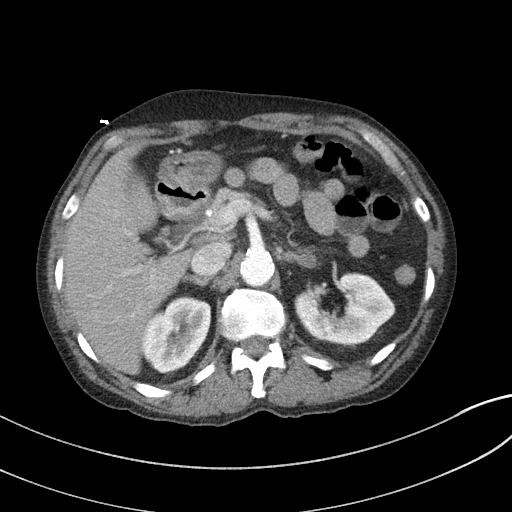
[im 73/93  soft-tissue]
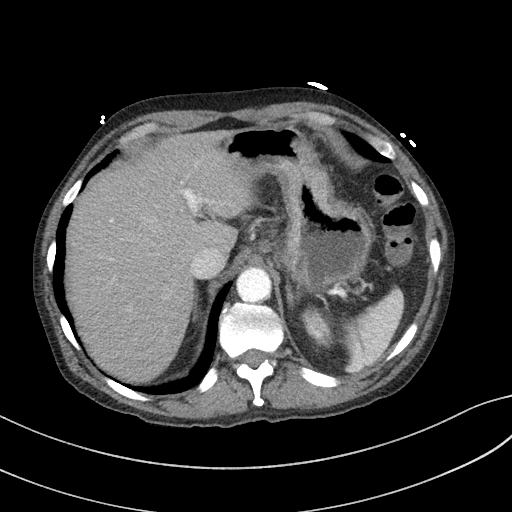
[im 81/93  soft-tissue]
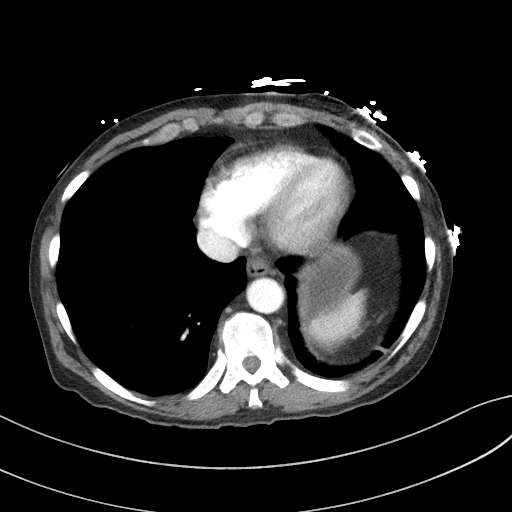
[im 89/93  soft-tissue]
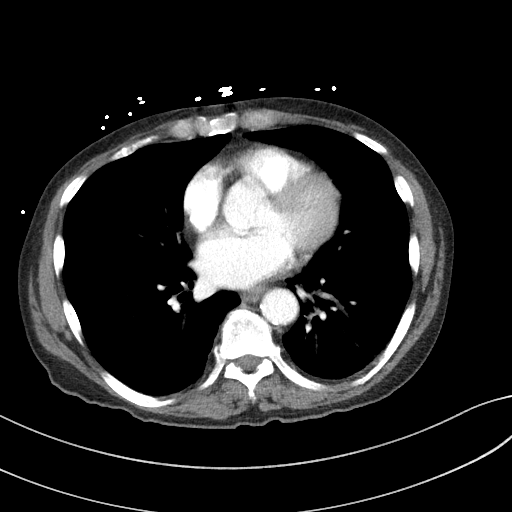

[Series 6: a/p w/ cor · coronal · 0.68mm/px · 3 of 137 slices shown]
[im 46/137  soft-tissue]
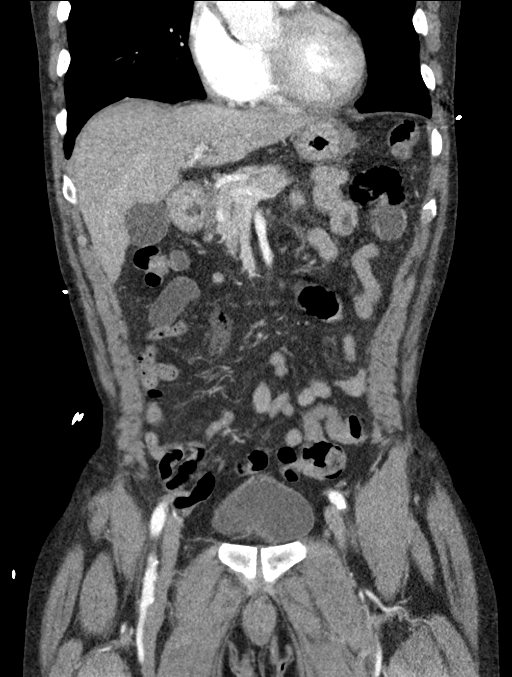
[im 61/137  soft-tissue]
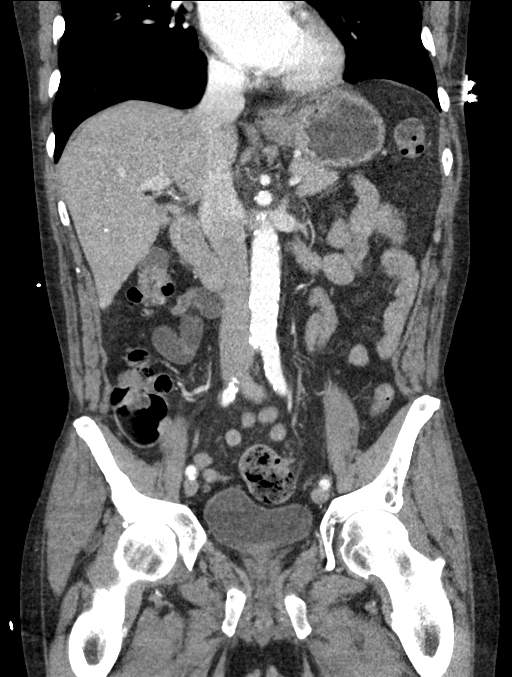
[im 76/137  soft-tissue]
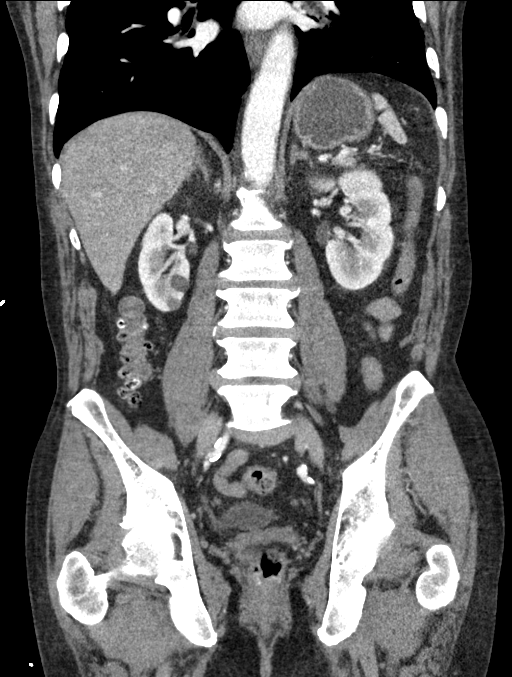

[16 of 46 positions shown; findings below may reference images not displayed]

FINDINGS: Lower chest: There is infiltrate/volume loss in the left lower lobe.
Right lung base is clear. Heart size is normal. No pericardial
fluid.

Hepatobiliary: Normal

Pancreas: Normal

Spleen: Normal

Adrenals/Urinary Tract: Adrenal glands are normal. Kidneys are
normal except for a few simple cysts on the right. Bladder is
normal.

Stomach/Bowel: The appendix is normal. There is diverticulosis
without evidence of diverticulitis. No small bowel abnormality is
seen.

Vascular/Lymphatic: Aortic atherosclerosis without aneurysm. IVC is
normal. No retroperitoneal mass or lymphadenopathy.

Reproductive: Normal

Other: No free fluid or air.

Musculoskeletal: Ordinary degenerative spondylosis. No acute
finding.
IMPRESSION: No cause of right-sided abdominal pain identified. Diverticulosis of
the right: Without imaging evidence of diverticulitis. The appendix
appears normal by CT.

Left lower lobe atelectasis/pneumonia.

Aortic atherosclerosis.

## 2018-09-03 ENCOUNTER — Other Ambulatory Visit: Payer: Self-pay

## 2018-09-03 ENCOUNTER — Encounter: Payer: Self-pay | Admitting: Internal Medicine

## 2018-09-03 ENCOUNTER — Ambulatory Visit (INDEPENDENT_AMBULATORY_CARE_PROVIDER_SITE_OTHER): Payer: Medicare Other | Admitting: Internal Medicine

## 2018-09-03 VITALS — BP 120/70 | HR 75 | Temp 97.8°F | Ht 66.0 in | Wt 135.0 lb

## 2018-09-03 DIAGNOSIS — M4802 Spinal stenosis, cervical region: Secondary | ICD-10-CM | POA: Diagnosis not present

## 2018-09-03 DIAGNOSIS — R634 Abnormal weight loss: Secondary | ICD-10-CM

## 2018-09-03 DIAGNOSIS — Z13220 Encounter for screening for lipoid disorders: Secondary | ICD-10-CM

## 2018-09-03 DIAGNOSIS — R630 Anorexia: Secondary | ICD-10-CM

## 2018-09-03 DIAGNOSIS — F17210 Nicotine dependence, cigarettes, uncomplicated: Secondary | ICD-10-CM

## 2018-09-03 DIAGNOSIS — G992 Myelopathy in diseases classified elsewhere: Secondary | ICD-10-CM

## 2018-09-03 DIAGNOSIS — F1010 Alcohol abuse, uncomplicated: Secondary | ICD-10-CM

## 2018-09-03 DIAGNOSIS — K7400 Hepatic fibrosis, unspecified: Secondary | ICD-10-CM

## 2018-09-03 DIAGNOSIS — R739 Hyperglycemia, unspecified: Secondary | ICD-10-CM | POA: Diagnosis not present

## 2018-09-03 MED ORDER — MIRTAZAPINE 15 MG PO TABS: 15.0000 mg | ORAL_TABLET | Freq: Every day | ORAL | 3 refills | Status: DC

## 2018-09-03 NOTE — Progress Notes (Signed)
Provider:  Rexene Edison. Mariea Clonts, D.O., C.M.D. Location:   [place]  Place of Service:    clinic Previous PCP: Gayland Curry, DO Patient Care Team: Gayland Curry, DO as PCP - General (Geriatric Medicine) Gayland Curry, DO (Geriatric Medicine)  Extended Emergency Contact Information Primary Emergency Contact: [name],[name] Address: [address]          [place] [address] [place] Phone: [phone] Relation: Spouse  Goals of Care: Advanced Directive information Advanced Directives 05/27/2018  Does Patient Have a Medical Advance Directive? No  Would patient like information on creating a medical advance directive? -  Pre-existing out of facility DNR order (yellow form or pink MOST form) -    Chief Complaint  Patient presents with  . Annual Exam    CPE    HPI: Patient is a 73 y.o. male seen today for an annual physical exam.  He has no new complaints.  His habits have not changed.  I have not seen him since October of last year.  He did have his wellness visit in Jan with Janett Billow.    Still smoking 1 pack per 3 days.    He's drinking a 6 pack of beer per day.    Counseled about risks of his two habits and benefits of quitting.  He is not ready even with chronic hepatitis C.  Discussed cancer risks and risks of hepatic failure with complicated by his alcohol intake.  Had been out of his remeron about a month and a half.  Lost 6 lbs since visit in feb.  He still has numbness and tingling in his arms and hands from his neck arthritis.  He has refused cervical spinal surgery.  He's not had any recent abdominal pain, nausea or indigestion by his report.  He had a hospitalization in November where he had alcohol intoxication.  Past Medical History:  Diagnosis Date  . Allergy   . Chronic hepatitis C without mention of hepatic coma   . GERD (gastroesophageal reflux disease)   . H. pylori infection   . Hearing loss of aging   . Internal hemorrhoids without  mention of complication   . Lipoma of unspecified site   . Loss of weight   . Mild cognitive impairment, so stated   . Osteoarthrosis, unspecified whether generalized or localized, unspecified site   . Pain in joint, forearm   . Personal history of colonic adenoma 06/25/2012  . Rash and other nonspecific skin eruption   . Routine general medical examination at a health care facility   . Substance abuse ([place])   . Tinnitus of both ears   . Tobacco use disorder   . Unspecified visual loss    Past Surgical History:  Procedure Laterality Date  . COLONOSCOPY    . left shoulder  1990  . POLYPECTOMY      reports that he has been smoking cigarettes. He has a 10.00 pack-year smoking history. He has never used smokeless tobacco. He reports current alcohol use of about 18.0 standard drinks of alcohol per week. He reports current drug use. Frequency: 5.00 times per week. Drug: Marijuana.  Functional Status Survey:    Family History  Problem Relation Age of Onset  . Cancer Mother   . Stroke Sister   . Breast cancer Sister   . Colitis Neg Hx   . Esophageal cancer Neg Hx   . Stomach cancer Neg Hx   . Rectal cancer Neg Hx   .  Colon cancer Neg Hx     Health Maintenance  Topic Date Due  . INFLUENZA VACCINE  11/14/2018  . TETANUS/TDAP  03/06/2028  . Hepatitis C Screening  Completed  . PNA vac Low Risk Adult  Completed    No Known Allergies  Outpatient Encounter Medications as of 09/03/2018  Medication Sig  . mirtazapine (REMERON) 15 MG tablet Take 1 tablet (15 mg total) by mouth at bedtime.  . [DISCONTINUED] colchicine 0.6 MG tablet Take 1 tablet (0.6 mg total) by mouth daily. (Patient not taking: Reported on 06/12/2018)  . [DISCONTINUED] mirtazapine (REMERON) 15 MG tablet Take 1 tablet (15 mg total) by mouth at bedtime. (Patient not taking: Reported on 04/30/2018)   Facility-Administered Encounter Medications as of 09/03/2018  Medication  . 0.9 %  sodium chloride infusion    Review  of Systems  Constitutional: Positive for weight loss. Negative for chills, fever and malaise/fatigue.  HENT: Negative for congestion.   Eyes: Negative for blurred vision.       Glasses  Respiratory: Positive for wheezing. Negative for cough and shortness of breath.   Cardiovascular: Negative for chest pain, palpitations, claudication and leg swelling.  Gastrointestinal: Negative for abdominal pain, blood in stool, constipation, diarrhea and melena.       Chronic mass over right upper quadrant (reports chronic but I don't recall it being there when he had acute abdominal pain and was sent to the ED)  Genitourinary: Negative for dysuria.  Musculoskeletal: Positive for neck pain.  Skin: Negative for itching and rash.  Neurological: Positive for tingling and sensory change. Negative for dizziness and loss of consciousness.  Endo/Heme/Allergies: Does not bruise/bleed easily.  Psychiatric/Behavioral: Negative for depression and memory loss. The patient is nervous/anxious. The patient does not have insomnia.     Vitals:   09/03/18 0959  BP: 120/70  Pulse: 75  Temp: 97.8 F (36.6 C)  TempSrc: Oral  SpO2: 95%  Weight: 135 lb (61.2 kg)  Height: 5\' 6"  (1.676 m)   Body mass index is 21.79 kg/m. Physical Exam Vitals signs reviewed.  Constitutional:      General: He is not in acute distress.    Appearance: He is ill-appearing.     Comments: Cachectic male  HENT:     Head: Normocephalic and atraumatic.     Right Ear: External ear normal.     Left Ear: External ear normal.     Nose:     Comments: Deferred nose and mouth due to covid masking Eyes:     Extraocular Movements: Extraocular movements intact.     Conjunctiva/sclera: Conjunctivae normal.     Pupils: Pupils are equal, round, and reactive to light.     Comments: glasses  Pulmonary:     Effort: Pulmonary effort is normal.     Breath sounds: Normal breath sounds.  Abdominal:     General: Abdomen is flat. Bowel sounds are  normal. There is no distension.     Palpations: Abdomen is soft. There is mass.     Tenderness: There is no abdominal tenderness. There is no guarding or rebound.     Comments: Right upper quadrant soft mass up into ribs  Musculoskeletal: Normal range of motion.     Right lower leg: No edema.     Left lower leg: No edema.  Skin:    Coloration: Skin is not jaundiced.  Neurological:     Mental Status: He is alert and oriented to person, place, and time.  Motor: No weakness.     Gait: Gait normal.     Comments: Loss of musculature of hands, diminished sensation  Psychiatric:     Comments: Usual flat affect with poor eye contact, looks down at the floor     Labs reviewed: Basic Metabolic Panel: Recent Labs    03/06/18 2219  NA 137  K 3.3*  CL 108  CO2 18*  GLUCOSE 101*  BUN 8  CREATININE 0.81  CALCIUM 9.1   Liver Function Tests: Recent Labs    03/06/18 2219  AST 23  ALT 17  ALKPHOS 68  BILITOT 1.2  PROT 7.6  ALBUMIN 3.7   No results for input(s): LIPASE, AMYLASE in the last 8760 hours. No results for input(s): AMMONIA in the last 8760 hours. CBC: Recent Labs    03/06/18 2219  WBC 6.5  NEUTROABS 3.5  HGB 13.6  HCT 42.3  MCV 97.9  PLT 191   Cardiac Enzymes: No results for input(s): CKTOTAL, CKMB, CKMBINDEX, TROPONINI in the last 8760 hours.  Imaging and Procedures Recently: Reviewed prior imaging of liver including CT abd/pelvis in 7/18 and 3/19 US hepatic elastography (due to his hep C) neither of which showed a mass (may be lipoma ruq)  Assessment/Plan 1. Weight loss -he's back to losing weight again when he ran out of remeron -need to ensure he gets his f/u low dose chest CT and continues to f/u with GI about his hep C - mirtazapine (REMERON) 15 MG tablet; Take 1 tablet (15 mg total) by mouth at bedtime.  Dispense: 90 tablet; Refill: 3  2. Poor appetite - drinks quite a bit of beer and does not eat healthy foods or much food - mirtazapine  (REMERON) 15 MG tablet; Take 1 tablet (15 mg total) by mouth at bedtime.  Dispense: 90 tablet; Refill: 3  3. Liver fibrosis -needs to quit drinking to prevent hepatic malignancy after hep C   4. Stenosis of cervical spine with myelopathy ([place]) -had been on gabapentin at one time, but he decided it wasn't helping and stopped it  5. Smoking 1/2 pack a day or less -ongoing, counseled as above -needs f/u low dose CT which has already been ordered  6. Alcohol abuse -ongoing, counseled as above  7. Screening, lipid -f/u lab before next visit - Lipid panel; Future   Labs/tests ordered: Orders Placed This Encounter  Procedures  . CBC with Differential/Platelet  . COMPLETE METABOLIC PANEL WITH GFR  . Hemoglobin A1c  . TSH  . CBC with Differential/Platelet    Standing Status:   Future    Standing Expiration Date:   09/03/2019  . COMPLETE METABOLIC PANEL WITH GFR    Standing Status:   Future    Standing Expiration Date:   09/03/2019  . Lipid panel    Standing Status:   Future    Standing Expiration Date:   09/03/2019   F/u 6 mos    Zyair Rhein L. Kaycen Whitworth, D.O. [facility] [address], [place] [address] Cell Phone (Mon-Fri 8am-5pm):  [phone] On Call:  [phone] & follow prompts after 5pm & weekends Office Phone:  [phone] Office Fax:  [phone]

## 2018-09-04 LAB — TSH: TSH: 1.04 mIU/L (ref 0.40–4.50)

## 2018-09-04 LAB — CBC WITH DIFFERENTIAL/PLATELET
Absolute Monocytes: 388 cells/uL (ref 200–950)
Basophils Absolute: 51 cells/uL (ref 0–200)
Basophils Relative: 1 %
Eosinophils Absolute: 148 cells/uL (ref 15–500)
Eosinophils Relative: 2.9 %
HCT: 41.8 % (ref 38.5–50.0)
Hemoglobin: 14.5 g/dL (ref 13.2–17.1)
Lymphs Abs: 2137 cells/uL (ref 850–3900)
MCH: 34.4 pg — ABNORMAL HIGH (ref 27.0–33.0)
MCHC: 34.7 g/dL (ref 32.0–36.0)
MCV: 99.1 fL (ref 80.0–100.0)
MPV: 10.6 fL (ref 7.5–12.5)
Monocytes Relative: 7.6 %
Neutro Abs: 2377 cells/uL (ref 1500–7800)
Neutrophils Relative %: 46.6 %
Platelets: 168 10*3/uL (ref 140–400)
RBC: 4.22 10*6/uL (ref 4.20–5.80)
RDW: 12 % (ref 11.0–15.0)
Total Lymphocyte: 41.9 %
WBC: 5.1 10*3/uL (ref 3.8–10.8)

## 2018-09-04 LAB — COMPLETE METABOLIC PANEL WITH GFR
AG Ratio: 1.4 (calc) (ref 1.0–2.5)
ALT: 11 U/L (ref 9–46)
AST: 14 U/L (ref 10–35)
Albumin: 4.2 g/dL (ref 3.6–5.1)
Alkaline phosphatase (APISO): 60 U/L (ref 35–144)
BUN: 16 mg/dL (ref 7–25)
CO2: 25 mmol/L (ref 20–32)
Calcium: 9.4 mg/dL (ref 8.6–10.3)
Chloride: 106 mmol/L (ref 98–110)
Creat: 0.82 mg/dL (ref 0.70–1.18)
GFR, Est African American: 101 mL/min/{1.73_m2} (ref >=60)
GFR, Est Non African American: 87 mL/min/{1.73_m2} (ref >=60)
Globulin: 2.9 g/dL (calc) (ref 1.9–3.7)
Glucose, Bld: 89 mg/dL (ref 65–139)
Potassium: 4.1 mmol/L (ref 3.5–5.3)
Sodium: 138 mmol/L (ref 135–146)
Total Bilirubin: 1 mg/dL (ref 0.2–1.2)
Total Protein: 7.1 g/dL (ref 6.1–8.1)

## 2018-09-04 LAB — HEMOGLOBIN A1C
Hgb A1c MFr Bld: 4.3 % of total Hgb (ref <5.7)
Mean Plasma Glucose: 77 (calc)
eAG (mmol/L): 4.2 (calc)

## 2018-11-30 IMAGING — CR DG CHEST 2V
2 series · 2 of 2 positions shown · non-contrast
Comparison: 10/06/2013

CLINICAL DATA: Right lateral chest pain earlier tonight.

EXAM:
CHEST  2 VIEW

[chest lat]
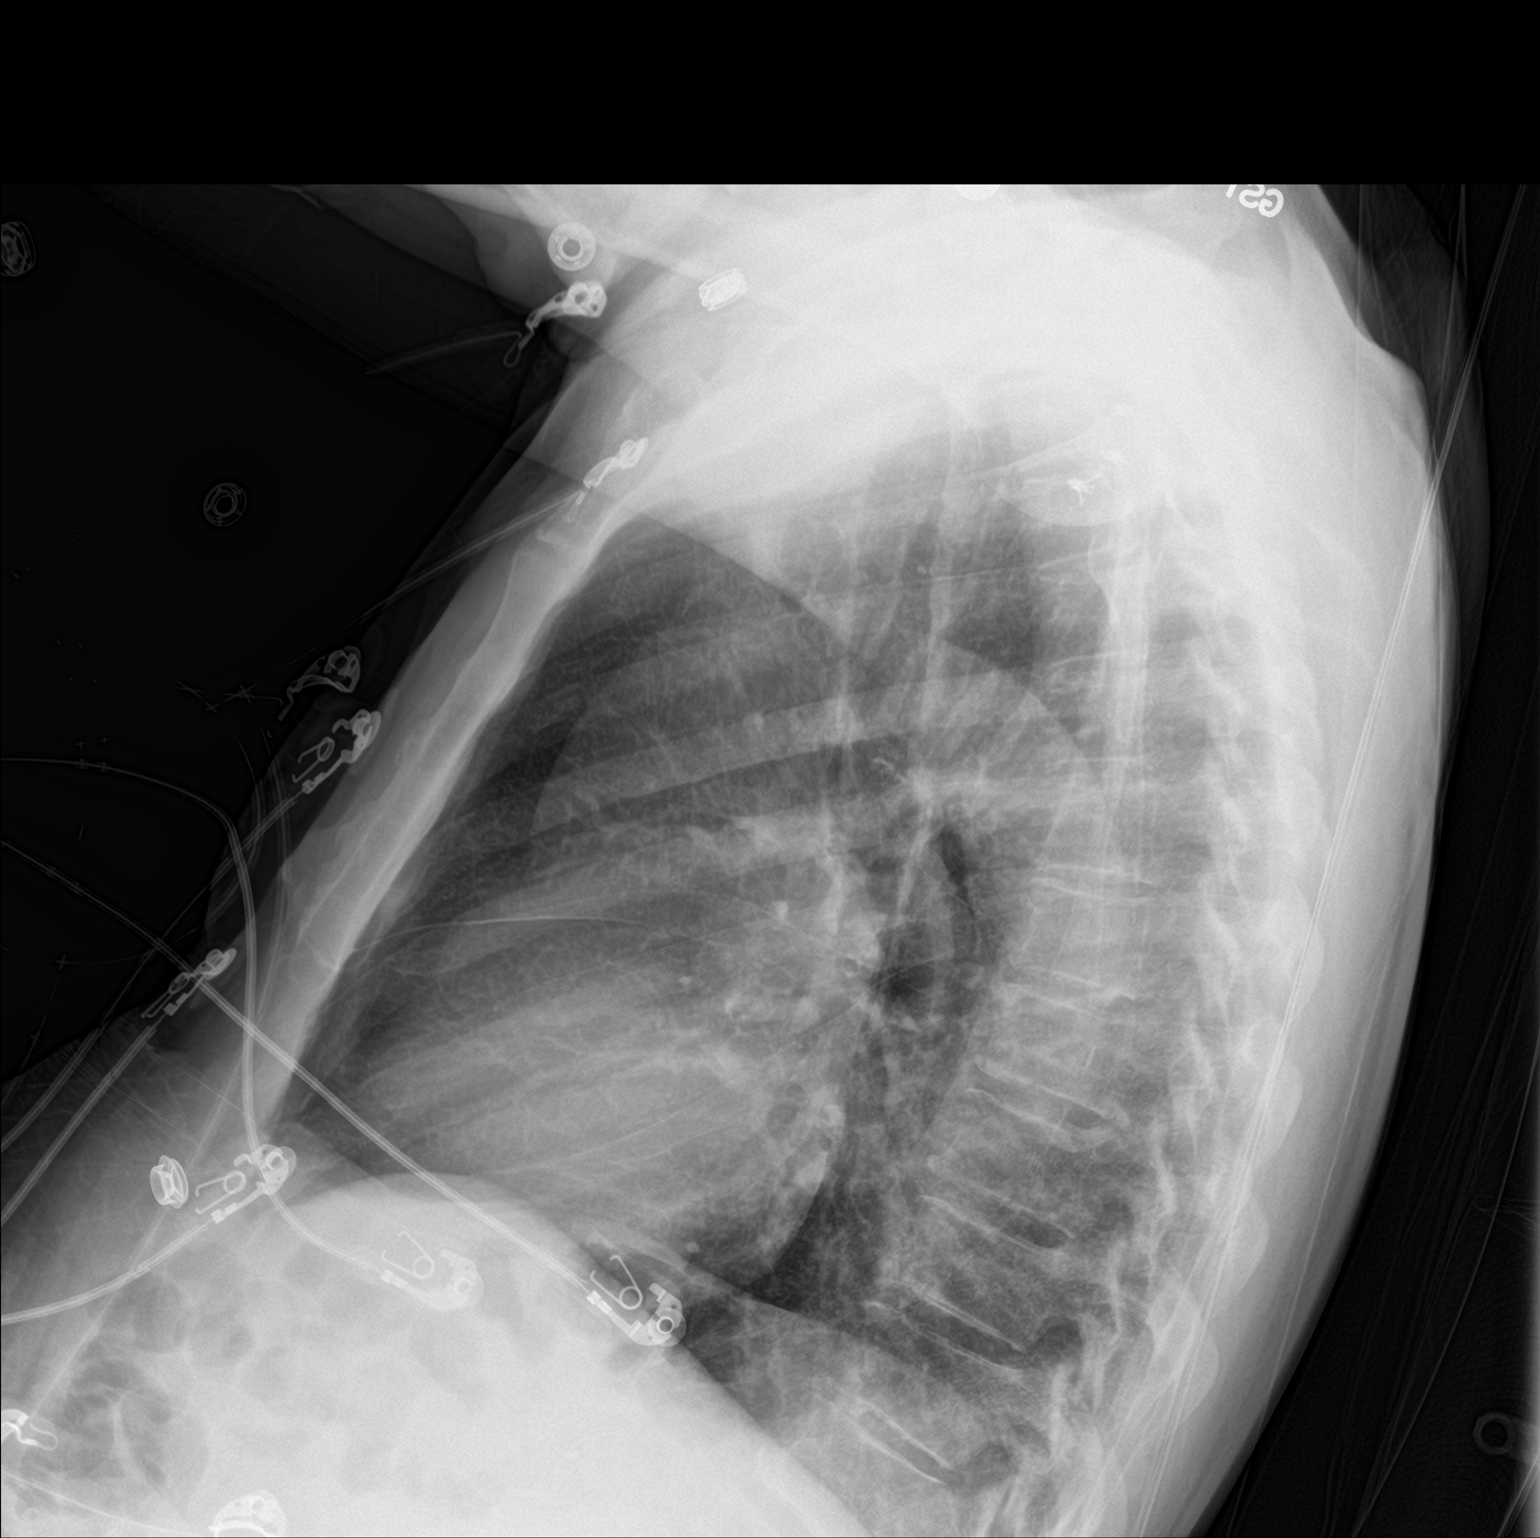

[chest ap]
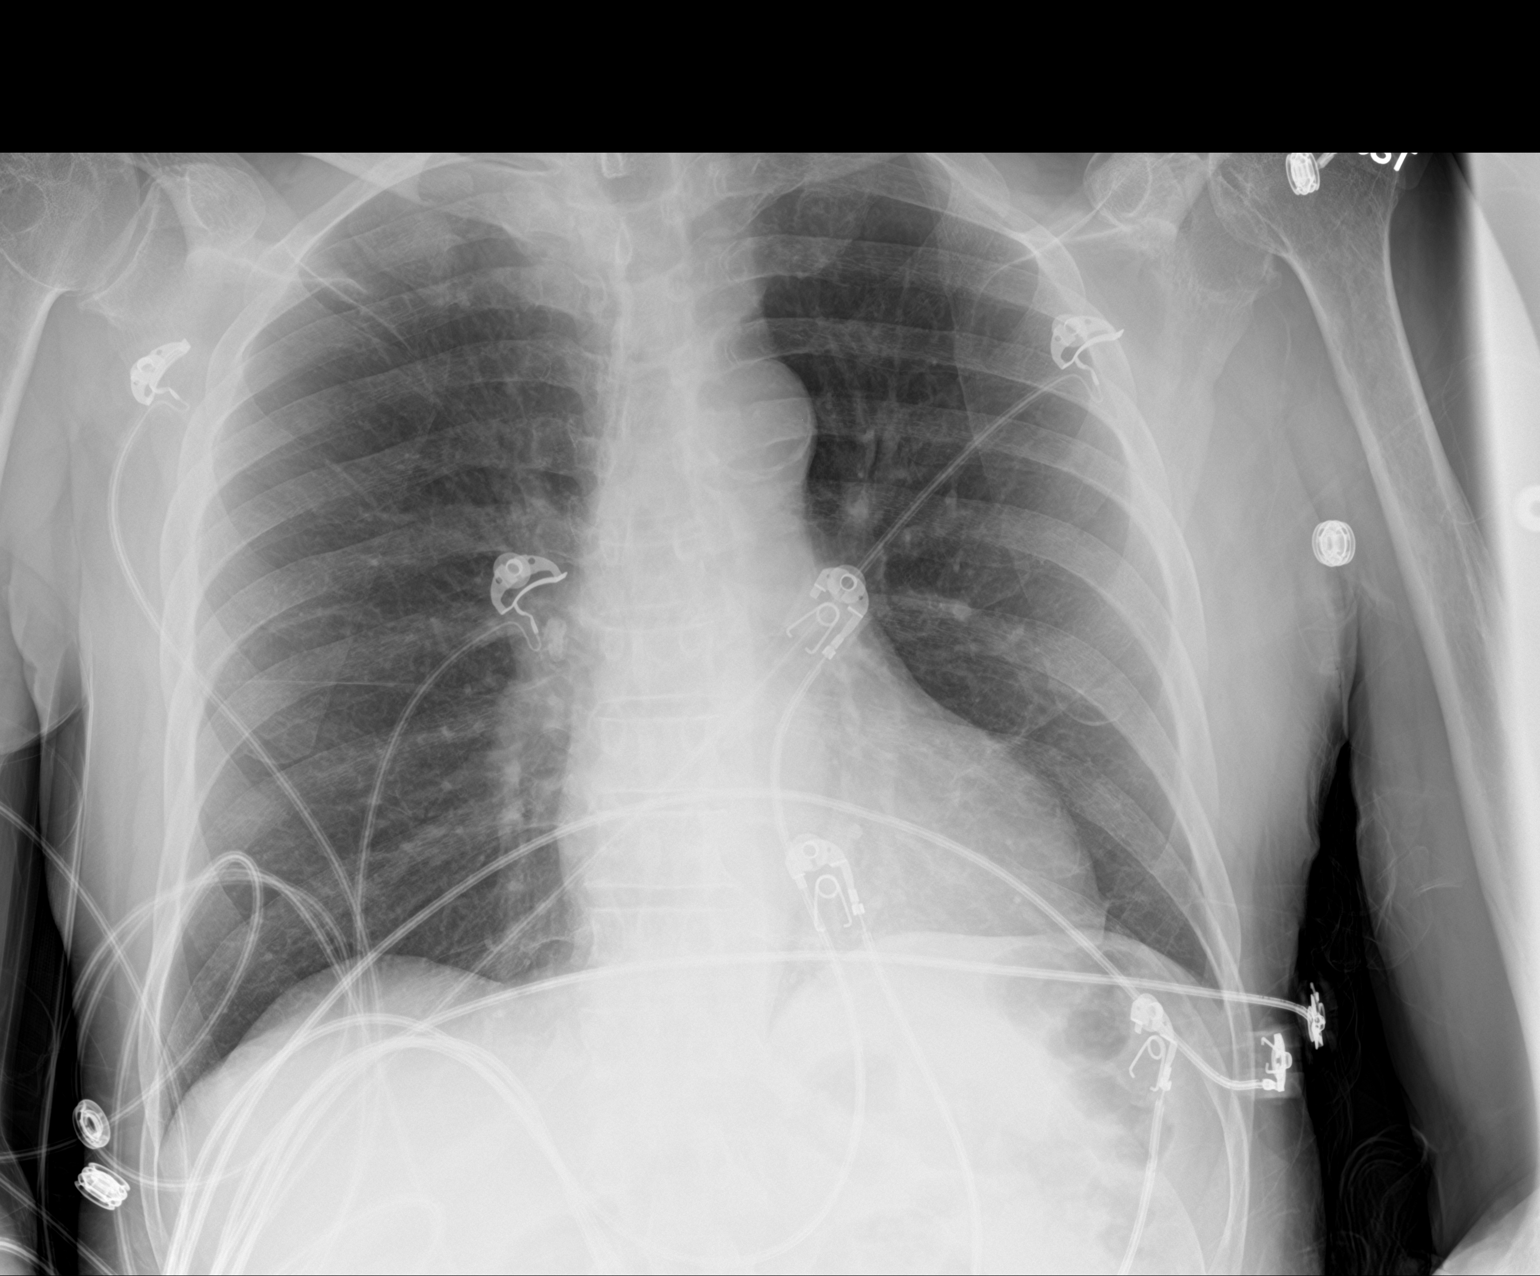

[2 of 2 positions shown; findings below may reference images not displayed]

FINDINGS: Normal heart size and pulmonary vascularity. No focal airspace
disease or consolidation in the lungs. No blunting of costophrenic
angles. No pneumothorax. Mediastinal contours appear intact.
Calcification of the aorta. Degenerative changes in the shoulders.
IMPRESSION: No evidence of active pulmonary disease.  Aortic atherosclerosis.

## 2019-03-04 ENCOUNTER — Other Ambulatory Visit: Payer: Self-pay

## 2019-03-04 ENCOUNTER — Other Ambulatory Visit: Payer: Medicare Other

## 2019-03-04 DIAGNOSIS — R634 Abnormal weight loss: Secondary | ICD-10-CM

## 2019-03-04 DIAGNOSIS — Z13220 Encounter for screening for lipoid disorders: Secondary | ICD-10-CM

## 2019-03-04 LAB — CBC WITH DIFFERENTIAL/PLATELET
Absolute Monocytes: 540 cells/uL (ref 200–950)
Basophils Absolute: 42 cells/uL (ref 0–200)
Basophils Relative: 0.7 %
Eosinophils Absolute: 210 cells/uL (ref 15–500)
Eosinophils Relative: 3.5 %
HCT: 43.3 % (ref 38.5–50.0)
Hemoglobin: 14.7 g/dL (ref 13.2–17.1)
Lymphs Abs: 2370 cells/uL (ref 850–3900)
MCH: 33.7 pg — ABNORMAL HIGH (ref 27.0–33.0)
MCHC: 33.9 g/dL (ref 32.0–36.0)
MCV: 99.3 fL (ref 80.0–100.0)
MPV: 9.7 fL (ref 7.5–12.5)
Monocytes Relative: 9 %
Neutro Abs: 2838 cells/uL (ref 1500–7800)
Neutrophils Relative %: 47.3 %
Platelets: 213 10*3/uL (ref 140–400)
RBC: 4.36 10*6/uL (ref 4.20–5.80)
RDW: 11.6 % (ref 11.0–15.0)
Total Lymphocyte: 39.5 %
WBC: 6 10*3/uL (ref 3.8–10.8)

## 2019-03-04 LAB — COMPLETE METABOLIC PANEL WITH GFR
AG Ratio: 1.4 (calc) (ref 1.0–2.5)
ALT: 13 U/L (ref 9–46)
AST: 18 U/L (ref 10–35)
Albumin: 4.5 g/dL (ref 3.6–5.1)
Alkaline phosphatase (APISO): 73 U/L (ref 35–144)
BUN: 12 mg/dL (ref 7–25)
CO2: 20 mmol/L (ref 20–32)
Calcium: 9.5 mg/dL (ref 8.6–10.3)
Chloride: 103 mmol/L (ref 98–110)
Creat: 0.85 mg/dL (ref 0.70–1.18)
GFR, Est African American: 99 mL/min/{1.73_m2} (ref >=60)
GFR, Est Non African American: 86 mL/min/{1.73_m2} (ref >=60)
Globulin: 3.3 g/dL (calc) (ref 1.9–3.7)
Glucose, Bld: 90 mg/dL (ref 65–99)
Potassium: 4 mmol/L (ref 3.5–5.3)
Sodium: 137 mmol/L (ref 135–146)
Total Bilirubin: 1.1 mg/dL (ref 0.2–1.2)
Total Protein: 7.8 g/dL (ref 6.1–8.1)

## 2019-03-04 LAB — LIPID PANEL
Cholesterol: 145 mg/dL (ref <200)
HDL: 61 mg/dL (ref >=40)
LDL Cholesterol (Calc): 70 mg/dL (calc)
Non-HDL Cholesterol (Calc): 84 mg/dL (calc) (ref <130)
Total CHOL/HDL Ratio: 2.4 (calc) (ref <5.0)
Triglycerides: 63 mg/dL (ref <150)

## 2019-03-08 ENCOUNTER — Ambulatory Visit: Payer: Medicare Other | Admitting: Internal Medicine

## 2019-03-19 ENCOUNTER — Ambulatory Visit (INDEPENDENT_AMBULATORY_CARE_PROVIDER_SITE_OTHER): Payer: Medicare Other | Admitting: Internal Medicine

## 2019-03-19 ENCOUNTER — Encounter: Payer: Self-pay | Admitting: Internal Medicine

## 2019-03-19 ENCOUNTER — Other Ambulatory Visit: Payer: Self-pay

## 2019-03-19 VITALS — BP 120/70 | HR 87 | Temp 97.5°F | Ht 66.0 in | Wt 132.0 lb

## 2019-03-19 DIAGNOSIS — F1010 Alcohol abuse, uncomplicated: Secondary | ICD-10-CM

## 2019-03-19 DIAGNOSIS — Z716 Tobacco abuse counseling: Secondary | ICD-10-CM

## 2019-03-19 DIAGNOSIS — M4802 Spinal stenosis, cervical region: Secondary | ICD-10-CM

## 2019-03-19 DIAGNOSIS — G992 Myelopathy in diseases classified elsewhere: Secondary | ICD-10-CM

## 2019-03-19 DIAGNOSIS — F17209 Nicotine dependence, unspecified, with unspecified nicotine-induced disorders: Secondary | ICD-10-CM

## 2019-03-19 DIAGNOSIS — B182 Chronic viral hepatitis C: Secondary | ICD-10-CM | POA: Diagnosis not present

## 2019-03-19 DIAGNOSIS — R918 Other nonspecific abnormal finding of lung field: Secondary | ICD-10-CM

## 2019-03-19 DIAGNOSIS — Z23 Encounter for immunization: Secondary | ICD-10-CM

## 2019-03-19 DIAGNOSIS — R634 Abnormal weight loss: Secondary | ICD-10-CM

## 2019-03-19 DIAGNOSIS — F17210 Nicotine dependence, cigarettes, uncomplicated: Secondary | ICD-10-CM

## 2019-03-19 NOTE — Progress Notes (Signed)
Location:  [facility] clinic Provider:  Tiandre Teall L. Mariea Clonts, D.O., C.M.D.  Goals of Care:  Advanced Directives 05/27/2018  Does Patient Have a Medical Advance Directive? No  Would patient like information on creating a medical advance directive? -  Pre-existing out of facility DNR order (yellow form or pink MOST form) -   Chief Complaint  Patient presents with  . Medical Management of Chronic Issues    69-month followup for medication management, no concerns.  . Immunizations    Flu shot given today.   HPI: Patient is a 73 y.o. male seen today for medical management of chronic diseases.    Takes his mirtazapine.  Has lost 3 lbs.  Normally eats two meals per day.  Has late breakfast at 11 and supper at 5-6.     Is still drinking a 6 pack over 2 days.  Then says he's drinking less.    Still smoking about 1 pack over 3 days of cigarettes.  He seems to think this is less but it's just the same as last visit.    Has numbness and tingliness every now and then in his fingers, but not all the time.  Has not wanted to pursue surgery when referred.  No more problems lately with abdominal pain or nausea.  Food is staying down.    CT chest was ordered to screen for nodules/look for lung cancer.  He's yet to arrange this.  He does agree to pursue it as of our discussion at the appt today.  Past Medical History:  Diagnosis Date  . Allergy   . Chronic hepatitis C without mention of hepatic coma   . GERD (gastroesophageal reflux disease)   . H. pylori infection   . Hearing loss of aging   . Internal hemorrhoids without mention of complication   . Lipoma of unspecified site   . Loss of weight   . Mild cognitive impairment, so stated   . Osteoarthrosis, unspecified whether generalized or localized, unspecified site   . Pain in joint, forearm   . Personal history of colonic adenoma 06/25/2012  . Rash and other nonspecific skin eruption   . Routine general medical examination at a health care facility    . Substance abuse ([place])   . Tinnitus of both ears   . Tobacco use disorder   . Unspecified visual loss     Past Surgical History:  Procedure Laterality Date  . COLONOSCOPY    . left shoulder  1990  . POLYPECTOMY      No Known Allergies  Outpatient Encounter Medications as of 03/19/2019  Medication Sig  . mirtazapine (REMERON) 15 MG tablet Take 1 tablet (15 mg total) by mouth at bedtime.   Facility-Administered Encounter Medications as of 03/19/2019  Medication  . 0.9 %  sodium chloride infusion    Review of Systems:  Review of Systems  Constitutional: Positive for weight loss. Negative for chills, fever and malaise/fatigue.  HENT: Positive for hearing loss.   Eyes: Negative for blurred vision.       Uses glasses  Respiratory: Negative for cough and shortness of breath.   Cardiovascular: Negative for chest pain, palpitations and leg swelling.  Gastrointestinal: Negative for abdominal pain, blood in stool, constipation, diarrhea, heartburn, melena, nausea and vomiting.  Genitourinary: Negative for dysuria.  Musculoskeletal: Positive for myalgias and neck pain. Negative for falls and joint pain.  Skin: Negative for itching and rash.  Neurological: Positive for tingling and sensory change. Negative for dizziness and  loss of consciousness.  Endo/Heme/Allergies: Does not bruise/bleed easily.  Psychiatric/Behavioral: Negative for depression and memory loss. The patient is not nervous/anxious and does not have insomnia.     Health Maintenance  Topic Date Due  . INFLUENZA VACCINE  11/14/2018  . TETANUS/TDAP  03/06/2028  . Hepatitis C Screening  Completed  . PNA vac Low Risk Adult  Completed    Physical Exam: Vitals:   03/19/19 0927  BP: 120/70  Pulse: 87  Temp: (!) 97.5 F (36.4 C)  TempSrc: Temporal  SpO2: 98%  Weight: 132 lb (59.9 kg)  Height: 5\' 6"  (1.676 m)   Body mass index is 21.31 kg/m. Physical Exam Vitals signs reviewed.  Constitutional:       General: He is not in acute distress.    Appearance: He is ill-appearing.     Comments: Cachectic male  HENT:     Head: Normocephalic and atraumatic.  Cardiovascular:     Rate and Rhythm: Normal rate and regular rhythm.     Pulses: Normal pulses.     Heart sounds: Normal heart sounds.  Pulmonary:     Effort: Pulmonary effort is normal.     Breath sounds: Normal breath sounds. No wheezing, rhonchi or rales.  Abdominal:     General: Bowel sounds are normal.     Palpations: Abdomen is soft.     Tenderness: There is no abdominal tenderness.  Musculoskeletal: Normal range of motion.     Right lower leg: No edema.     Left lower leg: No edema.     Comments: Atrophy of small muscles of hands  Skin:    General: Skin is warm and dry.  Neurological:     General: No focal deficit present.     Mental Status: He is alert and oriented to person, place, and time.     Comments: Changes responses more than once  Psychiatric:        Mood and Affect: Mood normal.     Comments: Unchanged from baseline--poor eye contact     Labs reviewed: Basic Metabolic Panel: Recent Labs    09/03/18 1101 03/04/19 0911  NA 138 137  K 4.1 4.0  CL 106 103  CO2 25 20  GLUCOSE 89 90  BUN 16 12  CREATININE 0.82 0.85  CALCIUM 9.4 9.5  TSH 1.04  --    Liver Function Tests: Recent Labs    09/03/18 1101 03/04/19 0911  AST 14 18  ALT 11 13  BILITOT 1.0 1.1  PROT 7.1 7.8   No results for input(s): LIPASE, AMYLASE in the last 8760 hours. No results for input(s): AMMONIA in the last 8760 hours. CBC: Recent Labs    09/03/18 1101 03/04/19 0911  WBC 5.1 6.0  NEUTROABS 2,377 2,838  HGB 14.5 14.7  HCT 41.8 43.3  MCV 99.1 99.3  PLT 168 213   Lipid Panel: Recent Labs    03/04/19 0911  CHOL 145  HDL 61  LDLCALC 70  TRIG 63  CHOLHDL 2.4   Lab Results  Component Value Date   HGBA1C 4.3 09/03/2018    Procedures since last visit: No results found.  Assessment/Plan 1. Need for influenza  vaccination - Flu Vaccine QUAD High Dose(Fluad) given today  2. Weight loss -continues, reports he is taking his mirtazapine as directed -never scheduled his CT chest to look for lung nodules and has had them in the past -continues to smoke and drink alcohol in excess   3. Chronic hepatitis C  without hepatic coma ([place]) -not wanting to follow-up with hepatitis c clinic  4. Stenosis of cervical spine with myelopathy ([facility]) -ongoing, stopped gabapentin which didn't help by his report -cervical spine surgery was recommended by neurosurgery but pt declined--has not had considerable changes since  5. Smoking 1/2 pack a day or less -ongoing, needs to pursue the CT chest and agrees as of today  6. Pulmonary nodules/lesions, multiple -agrees to schedule his CT chest that NP ordered long ago  7. Alcohol abuse -ongoing, he's not ready to quit  Labs/tests ordered:  Will ask referral coordinator to arrange the CT with him now  Next appt:  04/19/2019  Canda Podgorski L. Ajla Mcgeachy, D.O. [facility] [address], [place] [address] Cell Phone (Mon-Fri 8am-5pm):  [phone] On Call:  [phone] & follow prompts after 5pm & weekends Office Phone:  [phone] Office Fax:  [phone]

## 2019-04-19 ENCOUNTER — Encounter: Payer: Medicare Other | Admitting: Internal Medicine

## 2019-06-11 ENCOUNTER — Other Ambulatory Visit: Payer: Self-pay | Admitting: Internal Medicine

## 2019-06-11 DIAGNOSIS — Z720 Tobacco use: Secondary | ICD-10-CM

## 2019-06-14 ENCOUNTER — Telehealth: Payer: Self-pay

## 2019-06-14 DIAGNOSIS — F17210 Nicotine dependence, cigarettes, uncomplicated: Secondary | ICD-10-CM

## 2019-06-14 DIAGNOSIS — R634 Abnormal weight loss: Secondary | ICD-10-CM

## 2019-06-14 DIAGNOSIS — Z720 Tobacco use: Secondary | ICD-10-CM

## 2019-06-14 NOTE — Telephone Encounter (Signed)
Sarah from [facility] called and states that she received request from you for this patient to have a CT Lung Cancer Screening. Judson Roch states that patient doesn't meet the requirements for this imaging. But states that she can do Regular CT of the Chest for this patient. Contact information [phone], [phone].  Please Advise.

## 2019-06-14 NOTE — Telephone Encounter (Signed)
Order for regular CT chest was ordered instead of screening CT that NP Eubanks originally ordered at his wellness visit.

## 2019-06-22 ENCOUNTER — Ambulatory Visit
Admission: RE | Admit: 2019-06-22 | Discharge: 2019-06-22 | Disposition: A | Discharge status: Home / Self Care | Payer: Medicare Other | Source: Ambulatory Visit | Attending: Internal Medicine | Admitting: Internal Medicine

## 2019-06-22 DIAGNOSIS — R634 Abnormal weight loss: Secondary | ICD-10-CM

## 2019-06-22 DIAGNOSIS — Z720 Tobacco use: Secondary | ICD-10-CM

## 2019-06-22 DIAGNOSIS — F17210 Nicotine dependence, cigarettes, uncomplicated: Secondary | ICD-10-CM

## 2019-06-22 DIAGNOSIS — J432 Centrilobular emphysema: Secondary | ICD-10-CM | POA: Diagnosis not present

## 2019-06-23 NOTE — Progress Notes (Signed)
CT chest indicates he has emphysema from his 20 years of smoking.  He has coronary artery disease.   He also had mucus plugs in his upper airways which suggests that things are not always going down the right pipe--this can happen amid episodes of being passed out from excessive alcohol or drugs of abuse or simply from the aging process.   Fortunately, there was no evidence of lung cancer.

## 2019-06-25 ENCOUNTER — Other Ambulatory Visit: Payer: Self-pay

## 2019-06-25 ENCOUNTER — Encounter: Payer: Self-pay | Admitting: Internal Medicine

## 2019-06-25 ENCOUNTER — Ambulatory Visit (INDEPENDENT_AMBULATORY_CARE_PROVIDER_SITE_OTHER): Payer: Medicare Other | Admitting: Internal Medicine

## 2019-06-25 VITALS — BP 122/78 | HR 72 | Temp 97.5°F | Ht 66.0 in | Wt 132.0 lb

## 2019-06-25 DIAGNOSIS — F17210 Nicotine dependence, cigarettes, uncomplicated: Secondary | ICD-10-CM | POA: Diagnosis not present

## 2019-06-25 DIAGNOSIS — I499 Cardiac arrhythmia, unspecified: Secondary | ICD-10-CM | POA: Diagnosis not present

## 2019-06-25 DIAGNOSIS — I2510 Atherosclerotic heart disease of native coronary artery without angina pectoris: Secondary | ICD-10-CM

## 2019-06-25 DIAGNOSIS — R630 Anorexia: Secondary | ICD-10-CM

## 2019-06-25 DIAGNOSIS — F17209 Nicotine dependence, unspecified, with unspecified nicotine-induced disorders: Secondary | ICD-10-CM | POA: Diagnosis not present

## 2019-06-25 MED ORDER — CHANTIX STARTING MONTH PAK 0.5 MG X 11 & 1 MG X 42 PO TABS: ORAL_TABLET | ORAL | 0 refills | Status: DC

## 2019-06-25 NOTE — Patient Instructions (Signed)
Steps to Quit Smoking Smoking tobacco is the leading cause of preventable death. It can affect almost every organ in the body. Smoking puts you and people around you at risk for many serious, long-lasting (chronic) diseases. Quitting smoking can be hard, but it is one of the best things that you can do for your health. It is never too late to quit. How do I get ready to quit? When you decide to quit smoking, make a plan to help you succeed. Before you quit:  Pick a date to quit. Set a date within the next 2 weeks to give you time to prepare.  Write down the reasons why you are quitting. Keep this list in places where you will see it often.  Tell your family, friends, and co-workers that you are quitting. Their support is important.  Talk with your doctor about the choices that may help you quit.  Find out if your health insurance will pay for these treatments.  Know the people, places, things, and activities that make you want to smoke (triggers). Avoid them. What first steps can I take to quit smoking?  Throw away all cigarettes at home, at work, and in your car.  Throw away the things that you use when you smoke, such as ashtrays and lighters.  Clean your car. Make sure to empty the ashtray.  Clean your home, including curtains and carpets. What can I do to help me quit smoking? Talk with your doctor about taking medicines and seeing a counselor at the same time. You are more likely to succeed when you do both.  If you are pregnant or breastfeeding, talk with your doctor about counseling or other ways to quit smoking. Do not take medicine to help you quit smoking unless your doctor tells you to do so. To quit smoking: Quit right away  Quit smoking totally, instead of slowly cutting back on how much you smoke over a period of time.  Go to counseling. You are more likely to quit if you go to counseling sessions regularly. Take medicine You may take medicines to help you quit. Some  medicines need a prescription, and some you can buy over-the-counter. Some medicines may contain a drug called nicotine to replace the nicotine in cigarettes. Medicines may:  Help you to stop having the desire to smoke (cravings).  Help to stop the problems that come when you stop smoking (withdrawal symptoms). Your doctor may ask you to use:  Nicotine patches, gum, or lozenges.  Nicotine inhalers or sprays.  Non-nicotine medicine that is taken by mouth. Find resources Find resources and other ways to help you quit smoking and remain smoke-free after you quit. These resources are most helpful when you use them often. They include:  Online chats with a counselor.  Phone quitlines.  Printed self-help materials.  Support groups or group counseling.  Text messaging programs.  Mobile phone apps. Use apps on your mobile phone or tablet that can help you stick to your quit plan. There are many free apps for mobile phones and tablets as well as websites. Examples include Quit Guide from the CDC and smokefree.gov  What things can I do to make it easier to quit?   Talk to your family and friends. Ask them to support and encourage you.  Call a phone quitline (1-800-QUIT-NOW), reach out to support groups, or work with a counselor.  Ask people who smoke to not smoke around you.  Avoid places that make you want to smoke,   such as: °? Bars. °? Parties. °? Smoke-break areas at work. °· Spend time with people who do not smoke. °· Lower the stress in your life. Stress can make you want to smoke. Try these things to help your stress: °? Getting regular exercise. °? Doing deep-breathing exercises. °? Doing yoga. °? Meditating. °? Doing a body scan. To do this, close your eyes, focus on one area of your body at a time from head to toe. Notice which parts of your body are tense. Try to relax the muscles in those areas. °How will I feel when I quit smoking? °Day 1 to 3 weeks °Within the first 24 hours,  you may start to have some problems that come from quitting tobacco. These problems are very bad 2-3 days after you quit, but they do not often last for more than 2-3 weeks. You may get these symptoms: °· Mood swings. °· Feeling restless, nervous, angry, or annoyed. °· Trouble concentrating. °· Dizziness. °· Strong desire for high-sugar foods and nicotine. °· Weight gain. °· Trouble pooping (constipation). °· Feeling like you may vomit (nausea). °· Coughing or a sore throat. °· Changes in how the medicines that you take for other issues work in your body. °· Depression. °· Trouble sleeping (insomnia). °Week 3 and afterward °After the first 2-3 weeks of quitting, you may start to notice more positive results, such as: °· Better sense of smell and taste. °· Less coughing and sore throat. °· Slower heart rate. °· Lower blood pressure. °· Clearer skin. °· Better breathing. °· Fewer sick days. °Quitting smoking can be hard. Do not give up if you fail the first time. Some people need to try a few times before they succeed. Do your best to stick to your quit plan, and talk with your doctor if you have any questions or concerns. °Summary °· Smoking tobacco is the leading cause of preventable death. Quitting smoking can be hard, but it is one of the best things that you can do for your health. °· When you decide to quit smoking, make a plan to help you succeed. °· Quit smoking right away, not slowly over a period of time. °· When you start quitting, seek help from your doctor, family, or friends. °This information is not intended to replace advice given to you by your health care provider. Make sure you discuss any questions you have with your health care provider. °Document Revised: 12/25/2018 Document Reviewed: 06/20/2018 °Elsevier Patient Education © 2020 [employer] ° °Coping with Quitting Smoking ° °Quitting smoking is a physical and mental challenge. You will face cravings, withdrawal symptoms, and temptation. Before  quitting, work with your health care provider to make a plan that can help you cope. Preparation can help you quit and keep you from giving in. °How can I cope with cravings? °Cravings usually last for 5-10 minutes. If you get through it, the craving will pass. Consider taking the following actions to help you cope with cravings: °· Keep your mouth busy: °? Chew sugar-free gum. °? Suck on hard candies or a straw. °? Brush your teeth. °· Keep your hands and body busy: °? Immediately change to a different activity when you feel a craving. °? Squeeze or play with a ball. °? Do an activity or a hobby, like making bead jewelry, practicing needlepoint, or working with wood. °? Mix up your normal routine. °? Take a short exercise break. Go for a quick walk or run up and down stairs. °? Spend   time in public places where smoking is not allowed. °· Focus on doing something kind or helpful for someone else. °· Call a friend or family member to talk during a craving. °· Join a support group. °· Call a quit line, such as 1-800-QUIT-NOW. °· Talk with your health care provider about medicines that might help you cope with cravings and make quitting easier for you. °How can I deal with withdrawal symptoms? °Your body may experience negative effects as it tries to get used to not having nicotine in the system. These effects are called withdrawal symptoms. They may include: °· Feeling hungrier than normal. °· Trouble concentrating. °· Irritability. °· Trouble sleeping. °· Feeling depressed. °· Restlessness and agitation. °· Craving a cigarette. °To manage withdrawal symptoms: °· Avoid places, people, and activities that trigger your cravings. °· Remember why you want to quit. °· Get plenty of sleep. °· Avoid coffee and other caffeinated drinks. These may worsen some of your symptoms. °How can I handle social situations? °Social situations can be difficult when you are quitting smoking, especially in the first few weeks. To manage  this, you can: °· Avoid parties, bars, and other social situations where people might be smoking. °· Avoid alcohol. °· Leave right away if you have the urge to smoke. °· Explain to your family and friends that you are quitting smoking. Ask for understanding and support. °· Plan activities with friends or family where smoking is not an option. °What are some ways I can cope with stress? °Wanting to smoke may cause stress, and stress can make you want to smoke. Find ways to manage your stress. Relaxation techniques can help. For example: °· Breathe slowly and deeply, in through your nose and out through your mouth. °· Listen to soothing, relaxing music. °· Talk with a family member or friend about your stress. °· Light a candle. °· Soak in a bath or take a shower. °· Think about a peaceful place. °What are some ways I can prevent weight gain? °Be aware that many people gain weight after they quit smoking. However, not everyone does. To keep from gaining weight, have a plan in place before you quit and stick to the plan after you quit. Your plan should include: °· Having healthy snacks. When you have a craving, it may help to: °? Eat plain popcorn, crunchy carrots, celery, or other cut vegetables. °? Chew sugar-free gum. °· Changing how you eat: °? Eat small portion sizes at meals. °? Eat 4-6 small meals throughout the day instead of 1-2 large meals a day. °? Be mindful when you eat. Do not watch television or do other things that might distract you as you eat. °· Exercising regularly: °? Make time to exercise each day. If you do not have time for a long workout, do short bouts of exercise for 5-10 minutes several times a day. °? Do some form of strengthening exercise, like weight lifting, and some form of aerobic exercise, like running or swimming. °· Drinking plenty of water or other low-calorie or no-calorie drinks. Drink 6-8 glasses of water daily, or as much as instructed by your health care  provider. °Summary °· Quitting smoking is a physical and mental challenge. You will face cravings, withdrawal symptoms, and temptation to smoke again. Preparation can help you as you go through these challenges. °· You can cope with cravings by keeping your mouth busy (such as by chewing gum), keeping your body and hands busy, and making calls to family, friends,   or a helpline for people who want to quit smoking. °· You can cope with withdrawal symptoms by avoiding places where people smoke, avoiding drinks with caffeine, and getting plenty of rest. °· Ask your health care provider about the different ways to prevent weight gain, avoid stress, and handle social situations. °This information is not intended to replace advice given to you by your health care provider. Make sure you discuss any questions you have with your health care provider. °Document Revised: 03/14/2017 Document Reviewed: 03/29/2016 °Elsevier Patient Education © 2020 [employer]vier Inc. ° °

## 2019-06-25 NOTE — Progress Notes (Signed)
Location:  [facility] clinic Provider:  Tashawna Thom L. Mariea Clonts, D.O., C.M.D.  Goals of Care:  Advanced Directives 06/25/2019  Does Patient Have a Medical Advance Directive? No  Would patient like information on creating a medical advance directive? No - Patient declined  Pre-existing out of facility DNR order (yellow form or pink MOST form) -     Chief Complaint  Patient presents with  . Medical Management of Chronic Issues    concearns about CT scan     HPI: Patient is a 74 y.o. male seen today for review of CT chest results.    He does cough every now and then.  Not bringing up mucus.   He does have bad acid reflux.    CAD:  Discussed smoking cessation--smokes 1/2ppd. Bp, lipid control.  He agrees to try chantix.  He does not get winded going up steps or up hill.    He's not been choking or having trouble swallowing.  No episodes of being passed out and having to be aroused.    Appetite is about the same.  He eats small amounts at a time.  He likes greasy, fatty foods.    Fingers are cold and O2 is low.  No signs or symptoms of respiratory distress.  Past Medical History:  Diagnosis Date  . Allergy   . Chronic hepatitis C without mention of hepatic coma   . GERD (gastroesophageal reflux disease)   . H. pylori infection   . Hearing loss of aging   . Internal hemorrhoids without mention of complication   . Lipoma of unspecified site   . Loss of weight   . Mild cognitive impairment, so stated   . Osteoarthrosis, unspecified whether generalized or localized, unspecified site   . Pain in joint, forearm   . Personal history of colonic adenoma 06/25/2012  . Rash and other nonspecific skin eruption   . Routine general medical examination at a health care facility   . Substance abuse ([place])   . Tinnitus of both ears   . Tobacco use disorder   . Unspecified visual loss     Past Surgical History:  Procedure Laterality Date  . COLONOSCOPY    . left shoulder  1990  . POLYPECTOMY       No Known Allergies  Outpatient Encounter Medications as of 06/25/2019  Medication Sig  . mirtazapine (REMERON) 15 MG tablet Take 1 tablet (15 mg total) by mouth at bedtime.   Facility-Administered Encounter Medications as of 06/25/2019  Medication  . 0.9 %  sodium chloride infusion    Review of Systems:  Review of Systems  Constitutional: Negative for chills, fever and malaise/fatigue.  HENT: Negative for congestion and sore throat.   Eyes: Negative for blurred vision.  Respiratory: Positive for cough. Negative for hemoptysis, sputum production, shortness of breath and wheezing.   Cardiovascular: Negative for chest pain, palpitations and leg swelling.  Gastrointestinal: Negative for abdominal pain, blood in stool, constipation, diarrhea and melena.  Genitourinary: Negative for dysuria.  Musculoskeletal: Positive for neck pain. Negative for falls and joint pain.  Skin: Negative for itching and rash.  Neurological: Positive for tingling and sensory change. Negative for dizziness and loss of consciousness.  Endo/Heme/Allergies: Does not bruise/bleed easily.  Psychiatric/Behavioral: Positive for substance abuse. Negative for depression. The patient is not nervous/anxious and does not have insomnia.        Alcohol abuse, tobacco abuse    Health Maintenance  Topic Date Due  . TETANUS/TDAP  03/06/2028  .  INFLUENZA VACCINE  Completed  . Hepatitis C Screening  Completed  . PNA vac Low Risk Adult  Completed    Physical Exam: Vitals:   06/25/19 0911  BP: 122/78  Pulse: 72  Temp: (!) 97.5 F (36.4 C)  TempSrc: Temporal  SpO2: (!) 87%  Weight: 132 lb (59.9 kg)  Height: 5\' 6"  (1.676 m)   Body mass index is 21.31 kg/m. Physical Exam Vitals reviewed.  Constitutional:      General: He is not in acute distress.    Appearance: He is not ill-appearing or toxic-appearing.     Comments: Thin male  HENT:     Head: Normocephalic and atraumatic.  Cardiovascular:     Rate and  Rhythm: Normal rate. Rhythm irregular.     Comments: EKG performed Pulmonary:     Effort: Pulmonary effort is normal. No respiratory distress.     Breath sounds: Rhonchi present. No wheezing or rales.  Abdominal:     General: Bowel sounds are normal.     Palpations: Abdomen is soft.  Musculoskeletal:        General: Normal range of motion.     Right lower leg: No edema.     Left lower leg: No edema.  Neurological:     Mental Status: He is alert. Mental status is at baseline.  Psychiatric:        Mood and Affect: Mood normal.     Comments: Flat affect     Labs reviewed: Basic Metabolic Panel: Recent Labs    09/03/18 1101 03/04/19 0911  NA 138 137  K 4.1 4.0  CL 106 103  CO2 25 20  GLUCOSE 89 90  BUN 16 12  CREATININE 0.82 0.85  CALCIUM 9.4 9.5  TSH 1.04  --    Liver Function Tests: Recent Labs    09/03/18 1101 03/04/19 0911  AST 14 18  ALT 11 13  BILITOT 1.0 1.1  PROT 7.1 7.8   No results for input(s): LIPASE, AMYLASE in the last 8760 hours. No results for input(s): AMMONIA in the last 8760 hours. CBC: Recent Labs    09/03/18 1101 03/04/19 0911  WBC 5.1 6.0  NEUTROABS 2,377 2,838  HGB 14.5 14.7  HCT 41.8 43.3  MCV 99.1 99.3  PLT 168 213   Lipid Panel: Recent Labs    03/04/19 0911  CHOL 145  HDL 61  LDLCALC 70  TRIG 63  CHOLHDL 2.4   Lab Results  Component Value Date   HGBA1C 4.3 09/03/2018    Procedures since last visit: CT Chest Wo Contrast  Result Date: 06/23/2019 CLINICAL DATA:  Smoker, 20 year smoking history, COPD EXAM: CT CHEST WITHOUT CONTRAST TECHNIQUE: Multidetector CT imaging of the chest was performed following the standard protocol without IV contrast. COMPARISON:  01/30/2007 FINDINGS: Cardiovascular: Aortic atherosclerosis. Normal heart size. Three-vessel coronary artery calcifications. No pericardial effusion. Mediastinum/Nodes: No enlarged mediastinal, hilar, or axillary lymph nodes. Benign calcified right hilar lymph nodes  in keeping with prior granulomatous infection. Thyroid gland, trachea, and esophagus demonstrate no significant findings. Lungs/Pleura: Minimal centrilobular emphysema. Bandlike scarring or atelectasis of the bilateral lung bases. There is frothy debris in the right mainstem bronchus and mucous plugging of the right lower lobe and right middle lobe. No pleural effusion or pneumothorax. Upper Abdomen: No acute abnormality. Musculoskeletal: No chest wall mass or suspicious bone lesions identified. IMPRESSION: 1.  No suspicious pulmonary nodules. 2.  Minimal centrilobular emphysema.  Emphysema (ICD10-J43.9). 3. Debris in the right mainstem bronchus  and mucous plugging of the right lower lobe and right middle lobe, suggestive of aspiration. 4.  Coronary artery disease. Aortic Atherosclerosis (ICD10-I70.0). Electronically Signed   By: Eddie Candle M.D.   On: 06/23/2019 08:41    Assessment/Plan 1. Irregular heart beat - sinus rhythm on EKG - EKG 12-Lead  2. Coronary artery disease involving native coronary artery of native heart without angina pectoris -on CT chest -lipids are at goal  -discussed smoking cessation as most important step for him since bp and lipids and sugar are all controlled -he is currently agreeable to attempt cessation with chantix  3. Smoking 1/2 pack a day or less - discussed he needs to pick a smoking cessation date, and start the chantix, then continue taking it after quitting to help deter his desire to smoke -he will f/u in 4 wks after completing the starter pack - varenicline (CHANTIX STARTING MONTH PAK) 0.5 MG X 11 & 1 MG X 42 tablet; Take one 0.5 mg tablet by mouth once daily for 3 days, then increase to one 0.5 mg tablet twice daily for 4 days, then increase to one 1 mg tablet twice daily.  Dispense: 53 tablet; Refill: 0  4. Tobacco use disorder, continuous -counseled on risks of continued abuse - varenicline (CHANTIX STARTING MONTH PAK) 0.5 MG X 11 & 1 MG X 42 tablet;  Take one 0.5 mg tablet by mouth once daily for 3 days, then increase to one 0.5 mg tablet twice daily for 4 days, then increase to one 1 mg tablet twice daily.  Dispense: 53 tablet; Refill: 0  5. Poor appetite -has done generally better with remeron -still has a bad habit of drinking alcohol and only eating small amounts -his wife was with him today during the counseling so I'm hoping it will help -apparently he eats a terrible diet of fried and greasy items and he's been fortunate to have good genes to lower his LDL  Labs/tests ordered:  No new Next appt:  07/23/2019 f/u on smoking cessation   Keron Koffman L. Toshua Honsinger, D.O. [facility] [address], [place] [address] Cell Phone (Mon-Fri 8am-5pm):  [phone] On Call:  [phone] & follow prompts after 5pm & weekends Office Phone:  [phone] Office Fax:  [phone]

## 2019-06-25 NOTE — Progress Notes (Signed)
He's in normal sinus rhythm so I was likely hearing PVCs/PACs.

## 2019-07-23 ENCOUNTER — Other Ambulatory Visit: Payer: Self-pay

## 2019-07-23 ENCOUNTER — Encounter: Payer: Self-pay | Admitting: Internal Medicine

## 2019-07-23 ENCOUNTER — Ambulatory Visit (INDEPENDENT_AMBULATORY_CARE_PROVIDER_SITE_OTHER): Payer: Medicare Other | Admitting: Internal Medicine

## 2019-07-23 DIAGNOSIS — Z716 Tobacco abuse counseling: Secondary | ICD-10-CM

## 2019-07-23 DIAGNOSIS — R630 Anorexia: Secondary | ICD-10-CM | POA: Diagnosis not present

## 2019-07-23 DIAGNOSIS — F17200 Nicotine dependence, unspecified, uncomplicated: Secondary | ICD-10-CM

## 2019-07-23 DIAGNOSIS — R634 Abnormal weight loss: Secondary | ICD-10-CM | POA: Diagnosis not present

## 2019-07-23 DIAGNOSIS — F17210 Nicotine dependence, cigarettes, uncomplicated: Secondary | ICD-10-CM | POA: Diagnosis not present

## 2019-07-23 MED ORDER — MIRTAZAPINE 15 MG PO TABS: 15.0000 mg | ORAL_TABLET | Freq: Every day | ORAL | 3 refills | Status: DC

## 2019-07-23 NOTE — Progress Notes (Signed)
Location:  [facility] clinic Provider:  Lee-Ann Gal L. Mariea Clonts, D.O., C.M.D.  Goals of Care: has not ever filled in ACP documents though provided on several occasions  Advanced Directives 07/23/2019  Does Patient Have a Medical Advance Directive? -  Would patient like information on creating a medical advance directive? No - Patient declined  Pre-existing out of facility DNR order (yellow form or pink MOST form) -     Chief Complaint  Patient presents with  . Medical Management of Chronic Issues    4 week follow up     HPI: Patient is a 74 y.o. male seen today for 4 week f/u on smoking cessation attempt.   He could not afford chantix.   He is working to cut down himself.  He is smoking 10 cigarettes per day max.  Sometimes only smokes 5-10 in 2 days.  Discussed cost of cigarettes could be used for other things.  Does not want to try patches.  His wife does not smoke.   He knows he'll quit sooner or later.  He is not ready to set a quit date. He had his first covid vaccine and second is due to this week.  No problems from the first.   Spent 10 mins on smoking cessation counseling Past Medical History:  Diagnosis Date  . Allergy   . Chronic hepatitis C without mention of hepatic coma   . GERD (gastroesophageal reflux disease)   . H. pylori infection   . Hearing loss of aging   . Internal hemorrhoids without mention of complication   . Lipoma of unspecified site   . Loss of weight   . Mild cognitive impairment, so stated   . Osteoarthrosis, unspecified whether generalized or localized, unspecified site   . Pain in joint, forearm   . Personal history of colonic adenoma 06/25/2012  . Rash and other nonspecific skin eruption   . Routine general medical examination at a health care facility   . Substance abuse ([place])   . Tinnitus of both ears   . Tobacco use disorder   . Unspecified visual loss     Past Surgical History:  Procedure Laterality Date  . COLONOSCOPY    . left shoulder  1990    . POLYPECTOMY      No Known Allergies  Outpatient Encounter Medications as of 07/23/2019  Medication Sig  . mirtazapine (REMERON) 15 MG tablet Take 1 tablet (15 mg total) by mouth at bedtime.  . [DISCONTINUED] varenicline (CHANTIX STARTING MONTH PAK) 0.5 MG X 11 & 1 MG X 42 tablet Take one 0.5 mg tablet by mouth once daily for 3 days, then increase to one 0.5 mg tablet twice daily for 4 days, then increase to one 1 mg tablet twice daily.   Facility-Administered Encounter Medications as of 07/23/2019  Medication  . 0.9 %  sodium chloride infusion    Review of Systems:  Review of Systems  Constitutional: Negative for malaise/fatigue and weight loss (maintaining weight with use of mirtazapine).  HENT: Positive for hearing loss.   Eyes: Negative for blurred vision.  Respiratory: Negative for cough, sputum production and shortness of breath.   Cardiovascular: Negative for chest pain, palpitations and leg swelling.  Gastrointestinal: Negative for abdominal pain, blood in stool, constipation, diarrhea and melena.  Genitourinary: Negative for dysuria.  Musculoskeletal: Negative for falls and joint pain.  Neurological: Negative for dizziness and loss of consciousness.  Psychiatric/Behavioral: Negative for depression and memory loss. The patient is not nervous/anxious and  does not have insomnia.        Appetite poor when off mirtazapine    Health Maintenance  Topic Date Due  . INFLUENZA VACCINE  11/14/2019  . TETANUS/TDAP  03/06/2028  . Hepatitis C Screening  Completed  . PNA vac Low Risk Adult  Completed    Physical Exam: Vitals:   07/23/19 0859  BP: 128/62  Pulse: 75  Temp: 97.8 F (36.6 C)  SpO2: 97%  Weight: 134 lb (60.8 kg)  Height: 5\' 6"  (1.676 m)   Body mass index is 21.63 kg/m. Physical Exam Vitals reviewed.  Constitutional:      General: He is not in acute distress.    Appearance: He is not ill-appearing or toxic-appearing.     Comments: Thin male  HENT:      Head: Normocephalic and atraumatic.  Cardiovascular:     Rate and Rhythm: Normal rate and regular rhythm.  Pulmonary:     Effort: Pulmonary effort is normal.     Breath sounds: Rhonchi present. No wheezing or rales.  Abdominal:     General: Bowel sounds are normal.  Musculoskeletal:        General: Normal range of motion.     Cervical back: Neck supple.     Right lower leg: No edema.     Left lower leg: No edema.  Skin:    General: Skin is warm and dry.     Capillary Refill: Capillary refill takes less than 2 seconds.     Comments: Dry scaly  Neurological:     Mental Status: He is alert and oriented to person, place, and time.     Comments: Speech difficult to understand  Psychiatric:        Mood and Affect: Mood normal.        Behavior: Behavior normal.     Labs reviewed: Basic Metabolic Panel: Recent Labs    09/03/18 1101 03/04/19 0911  NA 138 137  K 4.1 4.0  CL 106 103  CO2 25 20  GLUCOSE 89 90  BUN 16 12  CREATININE 0.82 0.85  CALCIUM 9.4 9.5  TSH 1.04  --    Liver Function Tests: Recent Labs    09/03/18 1101 03/04/19 0911  AST 14 18  ALT 11 13  BILITOT 1.0 1.1  PROT 7.1 7.8   No results for input(s): LIPASE, AMYLASE in the last 8760 hours. No results for input(s): AMMONIA in the last 8760 hours. CBC: Recent Labs    09/03/18 1101 03/04/19 0911  WBC 5.1 6.0  NEUTROABS 2,377 2,838  HGB 14.5 14.7  HCT 41.8 43.3  MCV 99.1 99.3  PLT 168 213   Lipid Panel: Recent Labs    03/04/19 0911  CHOL 145  HDL 61  LDLCALC 70  TRIG 63  CHOLHDL 2.4   Lab Results  Component Value Date   HGBA1C 4.3 09/03/2018    Procedures since last visit: No results found.  Assessment/Plan 1. Tobacco abuse counseling -cannot afford chantix -does not want alternative like wellbutrin or patches -says he wants to gradually cut back on his own -he is not ready to set a quit date either  2. Smoking addiction -persists, smoking less than 1/2 ppd since last  visit -continue to monitor this and he's to let us know if he changes his mind about other options for smoking cessation  3. Weight loss - weight stable lately--actually up 2 lbs, but if mirtazapine stopped or he runs out, he loses weight abruptly -  mirtazapine (REMERON) 15 MG tablet; Take 1 tablet (15 mg total) by mouth at bedtime.  Dispense: 90 tablet; Refill: 3  4. Poor appetite -remains a concern w/o remeron so continue - mirtazapine (REMERON) 15 MG tablet; Take 1 tablet (15 mg total) by mouth at bedtime.  Dispense: 90 tablet; Refill: 3  Labs/tests ordered:  No new Next appt:  11/22/2019  Nesbit Michon L. Azalea Cedar, D.O. [facility] [address], [place] [address] Cell Phone (Mon-Fri 8am-5pm):  [phone] On Call:  [phone] & follow prompts after 5pm & weekends Office Phone:  [phone] Office Fax:  [phone]

## 2019-07-23 NOTE — Patient Instructions (Signed)

## 2019-08-19 ENCOUNTER — Encounter: Payer: Self-pay | Admitting: Podiatry

## 2019-08-19 ENCOUNTER — Ambulatory Visit: Payer: Medicare Other | Admitting: Podiatry

## 2019-08-19 ENCOUNTER — Other Ambulatory Visit: Payer: Self-pay

## 2019-08-19 DIAGNOSIS — M79675 Pain in left toe(s): Secondary | ICD-10-CM

## 2019-08-19 DIAGNOSIS — B351 Tinea unguium: Secondary | ICD-10-CM | POA: Diagnosis not present

## 2019-08-19 DIAGNOSIS — M79674 Pain in right toe(s): Secondary | ICD-10-CM | POA: Diagnosis not present

## 2019-08-19 DIAGNOSIS — Q828 Other specified congenital malformations of skin: Secondary | ICD-10-CM | POA: Diagnosis not present

## 2019-08-19 NOTE — Progress Notes (Signed)
Subjective: 74 year old male presents the office for concerns of thick, painful, elongated toenails that he cannot trim himself as well as for a painful skin lesion on the bottom of his right foot.  States the pain is intermittent on the callus.  Denies any drainage or pus or any swelling or redness. Denies any systemic complaints such as fevers, chills, nausea, vomiting. No acute changes since last appointment, and no other complaints at this time.   Objective: AAO x3, NAD DP/PT pulses palpable bilaterally, CRT less than 3 seconds Nails are hypertrophic, dystrophic, brittle, discolored, elongated 10. No surrounding redness or drainage. Tenderness nails 1-5 bilaterally. No open lesions or pre-ulcerative lesions are identified today. Hyperkeratotic lesion right foot submetatarsal 5 left.  No ulceration drainage or sign of infection. No open lesions or pre-ulcerative lesions.  No pain with calf compression, swelling, warmth, erythema  Assessment: Symptomatic cholelithiasis, hyperkeratotic lesion  Plan: -All treatment options discussed with the patient including all alternatives, risks, complications.  -Debrided nails x10 without any complications or bleeding -Hyperkeratotic lesion sharply debrided x1 without any complications or bleeding -Patient encouraged to call the office with any questions, concerns, change in symptoms.   Trula Slade DPM

## 2019-11-22 ENCOUNTER — Other Ambulatory Visit: Payer: Self-pay

## 2019-11-22 ENCOUNTER — Encounter: Payer: Self-pay | Admitting: Internal Medicine

## 2019-11-22 ENCOUNTER — Ambulatory Visit (INDEPENDENT_AMBULATORY_CARE_PROVIDER_SITE_OTHER): Payer: Medicare Other | Admitting: Internal Medicine

## 2019-11-22 ENCOUNTER — Ambulatory Visit: Payer: Medicare Other | Admitting: Podiatry

## 2019-11-22 VITALS — BP 120/70 | HR 79 | Temp 98.0°F | Resp 16 | Ht 66.0 in | Wt 133.6 lb

## 2019-11-22 DIAGNOSIS — B182 Chronic viral hepatitis C: Secondary | ICD-10-CM | POA: Diagnosis not present

## 2019-11-22 DIAGNOSIS — Z7189 Other specified counseling: Secondary | ICD-10-CM

## 2019-11-22 DIAGNOSIS — M4802 Spinal stenosis, cervical region: Secondary | ICD-10-CM

## 2019-11-22 DIAGNOSIS — Z716 Tobacco abuse counseling: Secondary | ICD-10-CM

## 2019-11-22 DIAGNOSIS — G992 Myelopathy in diseases classified elsewhere: Secondary | ICD-10-CM

## 2019-11-22 DIAGNOSIS — I2510 Atherosclerotic heart disease of native coronary artery without angina pectoris: Secondary | ICD-10-CM | POA: Diagnosis not present

## 2019-11-22 DIAGNOSIS — K7400 Hepatic fibrosis, unspecified: Secondary | ICD-10-CM

## 2019-11-22 DIAGNOSIS — F1010 Alcohol abuse, uncomplicated: Secondary | ICD-10-CM

## 2019-11-22 NOTE — Patient Instructions (Signed)

## 2019-11-22 NOTE — Progress Notes (Signed)
Location:  [facility] clinic Provider:  Bernhardt Riemenschneider L. Mariea Clonts, D.O., C.M.D.  Goals of Care:  Advanced Directives 11/22/2019  Does Patient Have a Medical Advance Directive? No  Would patient like information on creating a medical advance directive? No - Patient declined  Pre-existing out of facility DNR order (yellow form or pink MOST form) -     Chief Complaint  Patient presents with  . Medical Management of Chronic Issues    4 month follow up.  . Immunizations    Discuss the need for Covid Vaccine and Influenza Vaccine.    HPI: Patient is a 74 y.o. male seen today for medical management of chronic diseases.    Not having pain.   Appetite is doing ok.   Sleeping well at night.   Smoking a 1/2ppd or less.  He's giving them away more. No nausea, vomiting.   Leg cramps "ok".   Weight is stable recently. He's drinking beer--6 pack per day.  No drinking liquor.  Discussed importance of cutting back on drinking and recommendation of 2 beers of less per day.  He's had h/o liver fibrosis and hepatitis C.   Mood is good.    Past Medical History:  Diagnosis Date  . Allergy   . Chronic hepatitis C without mention of hepatic coma   . GERD (gastroesophageal reflux disease)   . H. pylori infection   . Hearing loss of aging   . Internal hemorrhoids without mention of complication   . Lipoma of unspecified site   . Loss of weight   . Mild cognitive impairment, so stated   . Osteoarthrosis, unspecified whether generalized or localized, unspecified site   . Pain in joint, forearm   . Personal history of colonic adenoma 06/25/2012  . Rash and other nonspecific skin eruption   . Routine general medical examination at a health care facility   . Substance abuse ([place])   . Tinnitus of both ears   . Tobacco use disorder   . Unspecified visual loss     Past Surgical History:  Procedure Laterality Date  . COLONOSCOPY    . left shoulder  1990  . POLYPECTOMY      No Known Allergies  Outpatient  Encounter Medications as of 11/22/2019  Medication Sig  . mirtazapine (REMERON) 15 MG tablet Take 1 tablet (15 mg total) by mouth at bedtime.   Facility-Administered Encounter Medications as of 11/22/2019  Medication  . 0.9 %  sodium chloride infusion    Review of Systems:  Review of Systems  Constitutional: Negative for chills, fever, malaise/fatigue and weight loss.  HENT: Positive for hearing loss. Negative for sore throat.   Eyes: Negative for blurred vision.       Glasses  Respiratory: Positive for cough and sputum production. Negative for shortness of breath.        Chronic  Cardiovascular: Negative for chest pain, palpitations and leg swelling.  Gastrointestinal: Negative for abdominal pain, blood in stool, constipation, diarrhea, heartburn and melena.  Genitourinary: Negative for dysuria.  Musculoskeletal: Positive for myalgias and neck pain. Negative for falls and joint pain.  Skin: Negative for itching and rash.  Neurological: Positive for tingling and sensory change. Negative for loss of consciousness.  Psychiatric/Behavioral: Negative for depression. The patient is not nervous/anxious and does not have insomnia.     Health Maintenance  Topic Date Due  . INFLUENZA VACCINE  11/14/2019  . TETANUS/TDAP  03/06/2028  . COVID-19 Vaccine  Completed  . Hepatitis C Screening  Completed  . PNA vac Low Risk Adult  Completed    Physical Exam: Vitals:   11/22/19 0829  BP: 120/70  Pulse: 79  Resp: 16  Temp: 98 F (36.7 C)  SpO2: 90%  Weight: 133 lb 9.6 oz (60.6 kg)  Height: 5\' 6"  (1.676 m)   Body mass index is 21.56 kg/m. Physical Exam Vitals reviewed.  Constitutional:      General: He is not in acute distress.    Appearance: Normal appearance. He is not toxic-appearing.  HENT:     Head: Normocephalic and atraumatic.     Ears:     Comments: HOH Eyes:     Comments: glasses  Cardiovascular:     Rate and Rhythm: Normal rate and regular rhythm.     Pulses: Normal  pulses.     Heart sounds: Normal heart sounds.  Pulmonary:     Effort: Pulmonary effort is normal.     Breath sounds: Normal breath sounds. No wheezing, rhonchi or rales.  Abdominal:     General: Abdomen is flat. Bowel sounds are normal.  Musculoskeletal:        General: Normal range of motion.     Cervical back: Neck supple.     Right lower leg: No edema.     Left lower leg: No edema.  Skin:    General: Skin is warm and dry.  Neurological:     General: No focal deficit present.     Mental Status: He is alert and oriented to person, place, and time.  Psychiatric:        Mood and Affect: Mood normal.        Behavior: Behavior normal.     Labs reviewed: Basic Metabolic Panel: Recent Labs    03/04/19 0911  NA 137  K 4.0  CL 103  CO2 20  GLUCOSE 90  BUN 12  CREATININE 0.85  CALCIUM 9.5   Liver Function Tests: Recent Labs    03/04/19 0911  AST 18  ALT 13  BILITOT 1.1  PROT 7.8   No results for input(s): LIPASE, AMYLASE in the last 8760 hours. No results for input(s): AMMONIA in the last 8760 hours. CBC: Recent Labs    03/04/19 0911  WBC 6.0  NEUTROABS 2,838  HGB 14.7  HCT 43.3  MCV 99.3  PLT 213   Lipid Panel: Recent Labs    03/04/19 0911  CHOL 145  HDL 61  LDLCALC 70  TRIG 63  CHOLHDL 2.4   Lab Results  Component Value Date   HGBA1C 4.3 09/03/2018     Assessment/Plan 1. Stenosis of cervical spine with myelopathy ([facility]) -not actively c/o pain or weakness or numbness recently, off gabapentin  2. Chronic hepatitis C without hepatic coma ([place]) - reportedly treatment was completed  - CBC with Differential/Platelet; Future - COMPLETE METABOLIC PANEL WITH GFR; Future - Lipid panel; Future  3. Encounter for smoking cessation counseling -counseled him today for more than 3 minutes; however, he is not interested in quitting--refused  4. Alcohol abuse - counseled on alcohol reduction or cessation today especially in view of his liver disease -  COMPLETE METABOLIC PANEL WITH GFR; Future  5. Coronary artery disease involving native coronary artery of native heart without angina pectoris - per imaging, no recent symptoms, not on meds at this time - CBC with Differential/Platelet; Future - Lipid panel; Future  6. Liver fibrosis - work on alcohol reduction, cessation - COMPLETE METABOLIC PANEL WITH GFR; Future - Lipid panel;  Future  7.  Educated about covid-19 and reminded about implementing 3 Ws again due to spread of delta variant  Labs/tests ordered:   Lab Orders     CBC with Differential/Platelet     COMPLETE METABOLIC PANEL WITH GFR     Lipid panel  Next appt:  03/23/2020 med mgt, fasting labs before  Lurae Hornbrook L. [place]a Hedman, D.O. [facility] [address], [place] [address] Cell Phone (Mon-Fri 8am-5pm):  [phone] On Call:  [phone] & follow prompts after 5pm & weekends Office Phone:  [phone] Office Fax:  [phone]

## 2020-02-23 ENCOUNTER — Encounter: Payer: Self-pay | Admitting: Podiatry

## 2020-02-23 ENCOUNTER — Ambulatory Visit: Payer: Medicare Other | Admitting: Podiatry

## 2020-02-23 ENCOUNTER — Other Ambulatory Visit: Payer: Self-pay

## 2020-02-23 DIAGNOSIS — M79672 Pain in left foot: Secondary | ICD-10-CM

## 2020-02-23 DIAGNOSIS — M79675 Pain in left toe(s): Secondary | ICD-10-CM | POA: Diagnosis not present

## 2020-02-23 DIAGNOSIS — Q828 Other specified congenital malformations of skin: Secondary | ICD-10-CM | POA: Diagnosis not present

## 2020-02-23 DIAGNOSIS — M79674 Pain in right toe(s): Secondary | ICD-10-CM | POA: Diagnosis not present

## 2020-02-23 DIAGNOSIS — M79671 Pain in right foot: Secondary | ICD-10-CM | POA: Diagnosis not present

## 2020-02-27 NOTE — Progress Notes (Signed)
Subjective:  Patient ID: Ryan Ortega, male    DOB: 12-23-1945,  MRN: [id number]  Ryan Ortega presents to clinic today for painful thick toenails that are difficult to trim. Pain interferes with ambulation. Aggravating factors include wearing enclosed shoe gear. Pain is relieved with periodic professional debridement..  74 y.o. male presents with the above complaint.    Review of Systems: Negative except as noted in the HPI. Past Medical History:  Diagnosis Date  . Allergy   . Chronic hepatitis C without mention of hepatic coma   . GERD (gastroesophageal reflux disease)   . H. pylori infection   . Hearing loss of aging   . Internal hemorrhoids without mention of complication   . Lipoma of unspecified site   . Loss of weight   . Mild cognitive impairment, so stated   . Osteoarthrosis, unspecified whether generalized or localized, unspecified site   . Pain in joint, forearm   . Personal history of colonic adenoma 06/25/2012  . Rash and other nonspecific skin eruption   . Routine general medical examination at a health care facility   . Substance abuse ([place])   . Tinnitus of both ears   . Tobacco use disorder   . Unspecified visual loss    Past Surgical History:  Procedure Laterality Date  . COLONOSCOPY    . left shoulder  1990  . POLYPECTOMY      Current Outpatient Medications:  .  mirtazapine (REMERON) 15 MG tablet, Take 1 tablet (15 mg total) by mouth at bedtime., Disp: 90 tablet, Rfl: 3  Current Facility-Administered Medications:  .  0.9 %  sodium chloride infusion, 500 mL, Intravenous, Continuous, Carlean Purl Ofilia Neas, MD No Known Allergies Social History   Occupational History  . Occupation: retired    Fish farm manager: [employer]  Tobacco Use  . Smoking status: Current Some Day Smoker    Packs/day: 0.25    Years: 40.00    Pack years: 10.00    Types: Cigarettes  . Smokeless tobacco: Never Used  Vaping Use  . Vaping Use: Never used  Substance and Sexual  Activity  . Alcohol use: Yes    Alcohol/week: 8.0 standard drinks    Types: 8 Cans of beer per week    Comment: weekly.  . Drug use: Yes    Frequency: 5.0 times per week    Types: Marijuana    Comment: smokes marijuna "every now and then during the week"  . Sexual activity: Yes    Objective:   Constitutional Ryan Ortega is a pleasant 74 y.o. African American male, WD, WN in NAD. AAO x 3.   Vascular Capillary fill time to digits <3 seconds b/l lower extremities. Palpable pedal pulses b/l LE. Pedal hair sparse. Lower extremity skin temperature gradient within normal limits. No pain with calf compression b/l. No edema noted b/l lower extremities. No cyanosis or clubbing noted.  Neurologic Normal speech. Oriented to person, place, and time. Protective sensation intact 5/5 intact bilaterally with 10g monofilament b/l. Vibratory sensation intact b/l.  Dermatologic Pedal skin with normal turgor, texture and tone bilaterally. No open wounds bilaterally. No interdigital macerations bilaterally. Toenails 1-5 b/l elongated, discolored, dystrophic, thickened, crumbly with subungual debris and tenderness to dorsal palpation. Porokeratotic lesion(s) submet head 3 left foot and sub sulcus right foot. No erythema, no edema, no drainage, no fluctuance.  Orthopedic: Normal muscle strength 5/5 to all lower extremity muscle groups bilaterally. Hallux valgus with bunion deformity noted b/l lower extremities.  Radiographs: None Assessment:   1. Pain in toes of both feet   2. Porokeratosis   3. Pain in both feet    Plan:  Patient was evaluated and treated and all questions answered.  Onychomycosis with pain -Nails palliatively debridement as below -Educated on self-care  Procedure: Nail Debridement Rationale: Pain Type of Debridement: manual, sharp debridement. Instrumentation: Nail nipper, rotary burr. Number of Nails: 10 -Examined patient. -Patient to continue soft, supportive shoe gear  daily. -Toenails 1-5 b/l were debrided in length and girth with sterile nail nippers and dremel without iatrogenic bleeding.  -Painful porokeratotic lesion(s) submet head 3 left foot and sub sulcus right foot pared and enucleated with sterile scalpel blade without incident. -Patient to report any pedal injuries to medical professional immediately. -Patient/POA to call should there be question/concern in the interim.  Return in about 3 months (around 05/25/2020) for toenail debridement w/corn(s)/callus(es).  Marzetta Board, DPM

## 2020-03-20 ENCOUNTER — Ambulatory Visit (INDEPENDENT_AMBULATORY_CARE_PROVIDER_SITE_OTHER): Payer: Medicare Other | Admitting: Family

## 2020-03-20 ENCOUNTER — Ambulatory Visit
Admission: RE | Admit: 2020-03-20 | Discharge: 2020-03-20 | Disposition: A | Discharge status: Home / Self Care | Payer: Medicare Other | Source: Ambulatory Visit | Attending: Family | Admitting: Family

## 2020-03-20 ENCOUNTER — Encounter: Payer: Self-pay | Admitting: Family

## 2020-03-20 ENCOUNTER — Other Ambulatory Visit: Payer: Medicare Other

## 2020-03-20 ENCOUNTER — Other Ambulatory Visit: Payer: Self-pay

## 2020-03-20 VITALS — BP 140/84 | HR 78 | Temp 97.8°F | Resp 16 | Ht 66.0 in | Wt 133.4 lb

## 2020-03-20 DIAGNOSIS — R1912 Hyperactive bowel sounds: Secondary | ICD-10-CM | POA: Diagnosis not present

## 2020-03-20 DIAGNOSIS — R143 Flatulence: Secondary | ICD-10-CM | POA: Diagnosis not present

## 2020-03-20 DIAGNOSIS — R1084 Generalized abdominal pain: Secondary | ICD-10-CM | POA: Diagnosis not present

## 2020-03-20 DIAGNOSIS — R109 Unspecified abdominal pain: Secondary | ICD-10-CM | POA: Diagnosis not present

## 2020-03-20 DIAGNOSIS — F1010 Alcohol abuse, uncomplicated: Secondary | ICD-10-CM

## 2020-03-20 DIAGNOSIS — K7400 Hepatic fibrosis, unspecified: Secondary | ICD-10-CM

## 2020-03-20 DIAGNOSIS — B182 Chronic viral hepatitis C: Secondary | ICD-10-CM

## 2020-03-20 DIAGNOSIS — I2510 Atherosclerotic heart disease of native coronary artery without angina pectoris: Secondary | ICD-10-CM

## 2020-03-20 LAB — CBC WITH DIFFERENTIAL/PLATELET
Absolute Monocytes: 390 cells/uL (ref 200–950)
Basophils Absolute: 33 cells/uL (ref 0–200)
Basophils Relative: 0.4 %
Eosinophils Absolute: 8 cells/uL — ABNORMAL LOW (ref 15–500)
Eosinophils Relative: 0.1 %
HCT: 45.1 % (ref 38.5–50.0)
Hemoglobin: 15.4 g/dL (ref 13.2–17.1)
Lymphs Abs: 1519 cells/uL (ref 850–3900)
MCH: 33.3 pg — ABNORMAL HIGH (ref 27.0–33.0)
MCHC: 34.1 g/dL (ref 32.0–36.0)
MCV: 97.6 fL (ref 80.0–100.0)
MPV: 10.5 fL (ref 7.5–12.5)
Monocytes Relative: 4.7 %
Neutro Abs: 6350 cells/uL (ref 1500–7800)
Neutrophils Relative %: 76.5 %
Platelets: 212 10*3/uL (ref 140–400)
RBC: 4.62 10*6/uL (ref 4.20–5.80)
RDW: 11.5 % (ref 11.0–15.0)
Total Lymphocyte: 18.3 %
WBC: 8.3 10*3/uL (ref 3.8–10.8)

## 2020-03-20 LAB — COMPLETE METABOLIC PANEL WITH GFR
AG Ratio: 1.5 (calc) (ref 1.0–2.5)
ALT: 9 U/L (ref 9–46)
AST: 13 U/L (ref 10–35)
Albumin: 4.8 g/dL (ref 3.6–5.1)
Alkaline phosphatase (APISO): 73 U/L (ref 35–144)
BUN: 9 mg/dL (ref 7–25)
CO2: 24 mmol/L (ref 20–32)
Calcium: 10.2 mg/dL (ref 8.6–10.3)
Chloride: 101 mmol/L (ref 98–110)
Creat: 0.87 mg/dL (ref 0.70–1.18)
GFR, Est African American: 98 mL/min/{1.73_m2} (ref >=60)
GFR, Est Non African American: 84 mL/min/{1.73_m2} (ref >=60)
Globulin: 3.2 g/dL (calc) (ref 1.9–3.7)
Glucose, Bld: 120 mg/dL — ABNORMAL HIGH (ref 65–99)
Potassium: 3.8 mmol/L (ref 3.5–5.3)
Sodium: 136 mmol/L (ref 135–146)
Total Bilirubin: 1.6 mg/dL — ABNORMAL HIGH (ref 0.2–1.2)
Total Protein: 8 g/dL (ref 6.1–8.1)

## 2020-03-20 LAB — LIPID PANEL
Cholesterol: 156 mg/dL (ref <200)
HDL: 61 mg/dL (ref >=40)
LDL Cholesterol (Calc): 80 mg/dL (calc)
Non-HDL Cholesterol (Calc): 95 mg/dL (calc) (ref <130)
Total CHOL/HDL Ratio: 2.6 (calc) (ref <5.0)
Triglycerides: 70 mg/dL (ref <150)

## 2020-03-20 MED ORDER — SIMETHICONE 80 MG PO CHEW: 80.0000 mg | CHEWABLE_TABLET | Freq: Four times a day (QID) | ORAL | 0 refills | Status: DC | PRN

## 2020-03-20 NOTE — Progress Notes (Signed)
Provider: Panagiotis Oelkers FNP-C  Gayland Curry, DO  Patient Care Team: Gayland Curry, DO as PCP - General (Geriatric Medicine) Gayland Curry, DO (Geriatric Medicine)  Extended Emergency Contact Information Primary Emergency Contact: [name],[name] Address: [address]          [place] [address] [place] Phone: [phone] Relation: Spouse  Code Status:Full Code  Goals of care: Advanced Directive information Advanced Directives 03/20/2020  Does Patient Have a Medical Advance Directive? No  Would patient like information on creating a medical advance directive? No - Patient declined  Pre-existing out of facility DNR order (yellow form or pink MOST form) -     Chief Complaint  Patient presents with  . Acute Visit    Stomach Pain since yesterday.    HPI:  Pt is a 74 y.o. male seen today for an acute visit for evaluation of stomach pain x 3 day.He was here for fasting lab work today.states couldn't sleep last night.No appetite but states last had a meal of rice and beans.He denies any blood in the stool,dark colored stool,nausea or vomiting.Last bowel movement this morning and was normal. He denies any fever or chills.Has had no recent acute illness. His blood blood pressure on arrival was elevated.Rechecked after calming down was normal.He denies any headache,fatigue or dizziness.   Past Medical History:  Diagnosis Date  . Allergy   . Chronic hepatitis C without mention of hepatic coma   . GERD (gastroesophageal reflux disease)   . H. pylori infection   . Hearing loss of aging   . Internal hemorrhoids without mention of complication   . Lipoma of unspecified site   . Loss of weight   . Mild cognitive impairment, so stated   . Osteoarthrosis, unspecified whether generalized or localized, unspecified site   . Pain in joint, forearm   . Personal history of colonic adenoma 06/25/2012  . Rash and other nonspecific skin eruption   . Routine general  medical examination at a health care facility   . Substance abuse ([place])   . Tinnitus of both ears   . Tobacco use disorder   . Unspecified visual loss    Past Surgical History:  Procedure Laterality Date  . COLONOSCOPY    . left shoulder  1990  . POLYPECTOMY      No Known Allergies  Outpatient Encounter Medications as of 03/20/2020  Medication Sig  . mirtazapine (REMERON) 15 MG tablet Take 1 tablet (15 mg total) by mouth at bedtime.  . simethicone (GAS-X) 80 MG chewable tablet Chew 1 tablet (80 mg total) by mouth every 6 (six) hours as needed for flatulence.   Facility-Administered Encounter Medications as of 03/20/2020  Medication  . 0.9 %  sodium chloride infusion    Review of Systems  Constitutional: Negative for appetite change, chills, fatigue and fever.  Eyes: Negative for pain, discharge, redness, itching and visual disturbance.  Respiratory: Negative for cough, chest tightness, shortness of breath and wheezing.   Cardiovascular: Negative for chest pain, palpitations and leg swelling.  Gastrointestinal: Positive for abdominal pain. Negative for abdominal distention, constipation, diarrhea, nausea and vomiting.  Skin: Negative for color change, pallor and rash.  Neurological: Negative for dizziness, speech difficulty, light-headedness and headaches.  Psychiatric/Behavioral: Negative for agitation and behavioral problems. The patient is not nervous/anxious.        Did sleep well last night due to gas pain     Immunization History  Administered Date(s) Administered  . DTaP  07/08/1955  . Fluad Quad(high Dose 65+) 03/19/2019  . Hepatitis A 09/05/2014  . Hepatitis B 09/05/2014  . Hepatitis B, adult 09/03/2017, 02/11/2018  . Hepatitis B, ped/adol 07/29/2017  . Influenza, High Dose Seasonal PF 02/17/2017, 01/22/2018  . Influenza,inj,Quad PF,6+ Mos 03/24/2014, 03/17/2015, 12/21/2015  . Moderna SARS-COVID-2 Vaccination 06/25/2019, 07/26/2019  . Pneumococcal Conjugate-13  06/24/2014  . Pneumococcal Polysaccharide-23 07/07/2009, 08/03/2012  . Td 07/08/1952  . Tdap 05/17/2013, 03/06/2018  . Varicella 07/07/1957  . Zoster 07/07/2004   Pertinent  Health Maintenance Due  Topic Date Due  . INFLUENZA VACCINE  11/14/2019  . PNA vac Low Risk Adult  Completed   Fall Risk  03/20/2020 11/22/2019 07/23/2019 06/25/2019 03/19/2019  Falls in the past year? 0 0 0 0 0  Number falls in past yr: 0 0 0 - -  Injury with Fall? 0 0 0 0 -   Functional Status Survey:    Vitals:   03/20/20 0828 03/20/20 0907  BP: (!) 160/90 140/84  Pulse: 78   Resp: 16   Temp: 97.8 F (36.6 C)   SpO2: 95%   Weight: 133 lb 6.4 oz (60.5 kg)   Height: 5\' 6"  (1.676 m)    Body mass index is 21.53 kg/m. Physical Exam Vitals reviewed.  Constitutional:      General: He is not in acute distress.    Appearance: He is normal weight. He is not ill-appearing.  HENT:     Head: Normocephalic.     Mouth/Throat:     Mouth: Mucous membranes are dry.     Pharynx: Oropharynx is clear. No oropharyngeal exudate or posterior oropharyngeal erythema.  Eyes:     General: No scleral icterus.       Right eye: No discharge.        Left eye: No discharge.     Extraocular Movements: Extraocular movements intact.     Conjunctiva/sclera: Conjunctivae normal.     Pupils: Pupils are equal, round, and reactive to light.  Neck:     Vascular: No carotid bruit.  Cardiovascular:     Rate and Rhythm: Normal rate and regular rhythm.     Pulses: Normal pulses.     Heart sounds: Normal heart sounds. No murmur heard.  No friction rub. No gallop.   Pulmonary:     Effort: Pulmonary effort is normal. No respiratory distress.     Breath sounds: Normal breath sounds. No wheezing, rhonchi or rales.  Chest:     Chest wall: No tenderness.  Abdominal:     General: Bowel sounds are increased. There is no distension.     Palpations: Abdomen is soft.     Tenderness: There is no abdominal tenderness. There is no right CVA  tenderness, left CVA tenderness, guarding or rebound. Negative signs include Murphy's sign and psoas sign.     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   Musculoskeletal:        General: No swelling or tenderness. Normal range of motion.     Cervical back: Normal range of motion. No rigidity or tenderness.     Right lower leg: No edema.     Left lower leg: No edema.  Lymphadenopathy:     Cervical: No cervical adenopathy.  Skin:    General: Skin is warm and dry.     Coloration: Skin is not pale.     Findings: No bruising, erythema or rash.  Neurological:  Mental Status: He is alert and oriented to person, place, and time.     Cranial Nerves: No cranial nerve deficit.     Motor: No weakness.     Coordination: Coordination normal.     Gait: Gait normal.  Psychiatric:        Mood and Affect: Mood normal.        Behavior: Behavior normal.        Thought Content: Thought content normal.     Labs reviewed: No results for input(s): NA, K, CL, CO2, GLUCOSE, BUN, CREATININE, CALCIUM, MG, PHOS in the last 8760 hours. No results for input(s): AST, ALT, ALKPHOS, BILITOT, PROT, ALBUMIN in the last 8760 hours. No results for input(s): WBC, NEUTROABS, HGB, HCT, MCV, PLT in the last 8760 hours. Lab Results  Component Value Date   TSH 1.04 09/03/2018   Lab Results  Component Value Date   HGBA1C 4.3 09/03/2018   Lab Results  Component Value Date   CHOL 145 03/04/2019   HDL 61 03/04/2019   LDLCALC 70 03/04/2019   TRIG 63 03/04/2019   CHOLHDL 2.4 03/04/2019    Significant Diagnostic Results in last 30 days:  No results found.  Assessment/Plan 1. Generalized abdominal pain Pain x 3 days.Has no appetite but was able to eat beans and rice yesterday.very Hyperactive bowel sounds on exam,Abdomen non-tender and non-distended. - Advised to notify provider or go to ED if develops any nausea,vomiting or abdominal  distention.  - DG Abd 1 View rule out obstruction. Advised to get KUB done at [facility] address given on AVS  - Had routine lab work done prior to visit.   2. Flatulence Simethicone as below.  - DG Abd 1 View - simethicone (GAS-X) 80 MG chewable tablet; Chew 1 tablet (80 mg total) by mouth every 6 (six) hours as needed for flatulence.  Dispense: 30 tablet; Refill: 0  3. Hyperactive bowel sounds - DG Abd 1 View  Family/ staff Communication: Reviewed plan of care with patient  Labs/tests ordered: - DG Abd 1 View  Next Appointment: Has upcoming appointment with Dr.Reed 03/23/2020.   Sandrea Hughs, NP

## 2020-03-20 NOTE — Patient Instructions (Signed)
Please get Abdominal X-ray done at [facility] at [place] [address] then will call you with results.

## 2020-03-20 NOTE — Progress Notes (Signed)
Will review at appt--do not call ahead Bad cholesterol has trended up--goal less than 70 for LDL Electrolytes, liver and kidneys ok Blood counts ok

## 2020-03-23 ENCOUNTER — Other Ambulatory Visit: Payer: Self-pay

## 2020-03-23 ENCOUNTER — Ambulatory Visit (INDEPENDENT_AMBULATORY_CARE_PROVIDER_SITE_OTHER): Payer: Medicare Other | Admitting: Internal Medicine

## 2020-03-23 ENCOUNTER — Encounter: Payer: Self-pay | Admitting: Internal Medicine

## 2020-03-23 VITALS — BP 150/82 | HR 74 | Temp 97.8°F | Ht 66.0 in | Wt 134.1 lb

## 2020-03-23 DIAGNOSIS — M4802 Spinal stenosis, cervical region: Secondary | ICD-10-CM

## 2020-03-23 DIAGNOSIS — R1084 Generalized abdominal pain: Secondary | ICD-10-CM | POA: Diagnosis not present

## 2020-03-23 DIAGNOSIS — G992 Myelopathy in diseases classified elsewhere: Secondary | ICD-10-CM

## 2020-03-23 DIAGNOSIS — I2510 Atherosclerotic heart disease of native coronary artery without angina pectoris: Secondary | ICD-10-CM | POA: Diagnosis not present

## 2020-03-23 DIAGNOSIS — F1010 Alcohol abuse, uncomplicated: Secondary | ICD-10-CM | POA: Diagnosis not present

## 2020-03-23 DIAGNOSIS — Z720 Tobacco use: Secondary | ICD-10-CM

## 2020-03-23 DIAGNOSIS — K219 Gastro-esophageal reflux disease without esophagitis: Secondary | ICD-10-CM | POA: Diagnosis not present

## 2020-03-23 DIAGNOSIS — Z23 Encounter for immunization: Secondary | ICD-10-CM

## 2020-03-23 MED ORDER — OMEPRAZOLE 20 MG PO CPDR: 20.0000 mg | DELAYED_RELEASE_CAPSULE | Freq: Two times a day (BID) | ORAL | 3 refills | Status: DC

## 2020-03-23 NOTE — Progress Notes (Signed)
Location:  [facility] clinic Provider:  Chaniyah Jahr L. Mariea Clonts, D.O., C.M.D.  Goals of Care:  Advanced Directives 03/23/2020  Does Patient Have a Medical Advance Directive? No  Would patient like information on creating a medical advance directive? No - Patient declined  Pre-existing out of facility DNR order (yellow form or pink MOST form) -     Chief Complaint  Patient presents with  . Medical Management of Chronic Issues    4 month follow up   . Acute Visit    Stomach pains x 4 days came in to see Dinah but he has not had any relief from the Gas X 80 mg     HPI: Patient is a 74 y.o. male seen today for medical management of chronic diseases--labs reviewed with him.  His primary concern per CMA is gas and he was given simethicone by Dinah just a few days ago.  He says something about it being sweet.   He is burping a lot.  Has trouble with his stomach burning.  Says he thinks he had a fever (not here).  Has not vomited.  No diarrhea.  Has had indigestion.  Talks fast and difficult to hear and understand.    He has no other complaints.  He continues to drink excessive beer and smokes.    Past Medical History:  Diagnosis Date  . Allergy   . Chronic hepatitis C without mention of hepatic coma   . GERD (gastroesophageal reflux disease)   . H. pylori infection   . Hearing loss of aging   . Internal hemorrhoids without mention of complication   . Lipoma of unspecified site   . Loss of weight   . Mild cognitive impairment, so stated   . Osteoarthrosis, unspecified whether generalized or localized, unspecified site   . Pain in joint, forearm   . Personal history of colonic adenoma 06/25/2012  . Rash and other nonspecific skin eruption   . Routine general medical examination at a health care facility   . Substance abuse ([place])   . Tinnitus of both ears   . Tobacco use disorder   . Unspecified visual loss     Past Surgical History:  Procedure Laterality Date  . COLONOSCOPY    . left  shoulder  1990  . POLYPECTOMY      No Known Allergies  Outpatient Encounter Medications as of 03/23/2020  Medication Sig  . mirtazapine (REMERON) 15 MG tablet Take 1 tablet (15 mg total) by mouth at bedtime.  . simethicone (GAS-X) 80 MG chewable tablet Chew 1 tablet (80 mg total) by mouth every 6 (six) hours as needed for flatulence.   Facility-Administered Encounter Medications as of 03/23/2020  Medication  . 0.9 %  sodium chloride infusion    Review of Systems:  Review of Systems  Constitutional: Positive for chills and malaise/fatigue. Negative for fever.  HENT: Positive for hearing loss. Negative for congestion and sore throat.   Eyes:       Glasses  Respiratory: Negative for cough and shortness of breath.   Cardiovascular: Negative for chest pain, palpitations and leg swelling.  Gastrointestinal: Positive for abdominal pain and heartburn. Negative for blood in stool, constipation, diarrhea, melena, nausea and vomiting.  Genitourinary: Negative for dysuria.  Musculoskeletal: Positive for neck pain. Negative for falls and joint pain.  Skin: Negative for itching and rash.  Neurological: Positive for tingling and sensory change. Negative for dizziness and loss of consciousness.  Psychiatric/Behavioral: Negative for depression and memory loss.  The patient is not nervous/anxious and does not have insomnia.     Health Maintenance  Topic Date Due  . COVID-19 Vaccine (3 - Booster for Moderna series) 01/25/2020  . TETANUS/TDAP  03/06/2028  . INFLUENZA VACCINE  Completed  . Hepatitis C Screening  Completed  . PNA vac Low Risk Adult  Completed    Physical Exam: Vitals:   03/23/20 1026  BP: (!) 150/82  Pulse: 74  Temp: 97.8 F (36.6 C)  SpO2: 93%  Weight: 134 lb 1.6 oz (60.8 kg)  Height: 5\' 6"  (1.676 m)   Body mass index is 21.64 kg/m. Physical Exam Vitals reviewed.  Constitutional:      General: He is not in acute distress.    Appearance: He is not toxic-appearing.   HENT:     Head: Normocephalic and atraumatic.  Eyes:     Comments: glasses  Cardiovascular:     Rate and Rhythm: Normal rate and regular rhythm.  Pulmonary:     Effort: Pulmonary effort is normal.     Breath sounds: Rhonchi present. No wheezing or rales.  Abdominal:     General: Bowel sounds are normal. There is no distension.     Palpations: Abdomen is soft.     Tenderness: There is no abdominal tenderness. There is no guarding or rebound.     Comments: Lipoma RUQ  Musculoskeletal:        General: Normal range of motion.     Right lower leg: No edema.     Left lower leg: No edema.  Skin:    General: Skin is warm and dry.  Neurological:     General: No focal deficit present.     Mental Status: He is alert and oriented to person, place, and time.  Psychiatric:        Mood and Affect: Mood normal.     Labs reviewed: Basic Metabolic Panel: Recent Labs    03/20/20 0815  NA 136  K 3.8  CL 101  CO2 24  GLUCOSE 120*  BUN 9  CREATININE 0.87  CALCIUM 10.2   Liver Function Tests: Recent Labs    03/20/20 0815  AST 13  ALT 9  BILITOT 1.6*  PROT 8.0   No results for input(s): LIPASE, AMYLASE in the last 8760 hours. No results for input(s): AMMONIA in the last 8760 hours. CBC: Recent Labs    03/20/20 0815  WBC 8.3  NEUTROABS 6,350  HGB 15.4  HCT 45.1  MCV 97.6  PLT 212   Lipid Panel: Recent Labs    03/20/20 0815  CHOL 156  HDL 61  LDLCALC 80  TRIG 70  CHOLHDL 2.6   Lab Results  Component Value Date   HGBA1C 4.3 09/03/2018    Procedures since last visit: DG Abd 1 View  Result Date: 03/20/2020 CLINICAL DATA:  Abdominal pain and hyperactive bowel sounds. EXAM: ABDOMEN - 1 VIEW COMPARISON:  None. FINDINGS: The bowel gas pattern is normal. No radio-opaque calculi or other significant radiographic abnormality are seen. IMPRESSION: Negative. Electronically Signed   By: Franchot Gallo M.D.   On: 03/20/2020 11:36    Assessment/Plan 1. Gastroesophageal  reflux disease, unspecified whether esophagitis present -c/o burning -has historically been on PPI but not lately -had h pylori before -restart PPI:   omeprazole (PRILOSEC) 20 MG capsule; Take 1 capsule (20 mg total) by mouth 2 (two) times daily before a meal.  Dispense: 60 capsule; Refill: 3  2. Need for influenza vaccination - Flu  Vaccine QUAD High Dose(Fluad)  3. Generalized abdominal pain - not tender, has had some chronic pancreatitis, but reports he's not having nausea or vomiting - omeprazole (PRILOSEC) 20 MG capsule; Take 1 capsule (20 mg total) by mouth 2 (two) times daily before a meal.  Dispense: 60 capsule; Refill: 3  4. Alc-ohol abuse -ongoing with excess beer intake--counseled about importance of weaning this especially with all of this gerd  5. Coronary artery disease involving native coronary artery of native heart without angina pectoris -not chest pressure or sob--noted on imaging   6. Stenosis of cervical spine with myelopathy (HCC) -not c/o pain today, no longer taking gabapentin therapy but did at one time  7. Tobacco abuse -ongoing and not ready to quit  Labs/tests ordered:  No new--will do labs same day  Next appt:  4 mos med mgt, labs same day  Xaden Kaufman L. Roslynn Holte, D.O. [facility] [address], [place] [address] Cell Phone (Mon-Fri 8am-5pm):  [phone] On Call:  [phone] & follow prompts after 5pm & weekends Office Phone:  [phone] Office Fax:  [phone]

## 2020-04-24 ENCOUNTER — Ambulatory Visit (INDEPENDENT_AMBULATORY_CARE_PROVIDER_SITE_OTHER): Payer: Medicare Other | Admitting: Nurse Practitioner

## 2020-04-24 ENCOUNTER — Other Ambulatory Visit: Payer: Self-pay

## 2020-04-24 ENCOUNTER — Encounter: Payer: Self-pay | Admitting: Nurse Practitioner

## 2020-04-24 ENCOUNTER — Telehealth: Payer: Self-pay

## 2020-04-24 DIAGNOSIS — Z0000 Encounter for general adult medical examination without abnormal findings: Secondary | ICD-10-CM

## 2020-04-24 NOTE — Telephone Encounter (Signed)
Mr. ladanian, kelter are scheduled for a virtual visit with your provider today.    Just as we do with appointments in the office, we must obtain your consent to participate.  Your consent will be active for this visit and any virtual visit you may have with one of our providers in the next 365 days.    If you have a MyChart account, I can also send a copy of this consent to you electronically.  All virtual visits are billed to your insurance company just like a traditional visit in the office.  As this is a virtual visit, video technology does not allow for your provider to perform a traditional examination.  This may limit your provider's ability to fully assess your condition.  If your provider identifies any concerns that need to be evaluated in person or the need to arrange testing such as labs, EKG, etc, we will make arrangements to do so.    Although advances in technology are sophisticated, we cannot ensure that it will always work on either your end or our end.  If the connection with a video visit is poor, we may have to switch to a telephone visit.  With either a video or telephone visit, we are not always able to ensure that we have a secure connection.   I need to obtain your verbal consent now.   Are you willing to proceed with your visit today?   JERRION TABBERT has provided verbal consent on 04/24/2020 for a virtual visit (video or telephone).   Leigh Aurora Felton, [place] 04/24/2020  3:33 PM

## 2020-04-24 NOTE — Patient Instructions (Addendum)
Ryan Ortega , Thank you for taking time to come for your Medicare Wellness Visit. I appreciate your ongoing commitment to your health goals. Please review the following plan we discussed and let me know if I can assist you in the future.   Screening recommendations/referrals: Colonoscopy up to date Recommended yearly ophthalmology/optometry visit for glaucoma screening and checkup Recommended yearly dental visit for hygiene and checkup  Vaccinations: Influenza vaccine up to date Pneumococcal vaccine up to date Tdap vaccine up to date Shingles vaccine RECOMMENDED, to get shingrix at local pharmacy    Advanced directives: recommended to completed and bring back to office to place on file.   Conditions/risks identified: smoker, advanced age, increase  Physical activity, alcohol abuse  Next appointment: 1 year  Preventive Care 25 Years and Older, Male Preventive care refers to lifestyle choices and visits with your health care provider that can promote health and wellness. What does preventive care include?  A yearly physical exam. This is also called an annual well check.  Dental exams once or twice a year.  Routine eye exams. Ask your health care provider how often you should have your eyes checked.  Personal lifestyle choices, including:  Daily care of your teeth and gums.  Regular physical activity.  Eating a healthy diet.  Avoiding tobacco and drug use.  Limiting alcohol use.  Practicing safe sex.  Taking low doses of aspirin every day.  Taking vitamin and mineral supplements as recommended by your health care provider. What happens during an annual well check? The services and screenings done by your health care provider during your annual well check will depend on your age, overall health, lifestyle risk factors, and family history of disease. Counseling  Your health care provider may ask you questions about your:  Alcohol use.  Tobacco use.  Drug  use.  Emotional well-being.  Home and relationship well-being.  Sexual activity.  Eating habits.  History of falls.  Memory and ability to understand (cognition).  Work and work Statistician. Screening  You may have the following tests or measurements:  Height, weight, and BMI.  Blood pressure.  Lipid and cholesterol levels. These may be checked every 5 years, or more frequently if you are over 5 years old.  Skin check.  Lung cancer screening. You may have this screening every year starting at age 85 if you have a 30-pack-year history of smoking and currently smoke or have quit within the past 15 years.  Fecal occult blood test (FOBT) of the stool. You may have this test every year starting at age 57.  Flexible sigmoidoscopy or colonoscopy. You may have a sigmoidoscopy every 5 years or a colonoscopy every 10 years starting at age 55.  Prostate cancer screening. Recommendations will vary depending on your family history and other risks.  Hepatitis C blood test.  Hepatitis B blood test.  Sexually transmitted disease (STD) testing.  Diabetes screening. This is done by checking your blood sugar (glucose) after you have not eaten for a while (fasting). You may have this done every 1-3 years.  Abdominal aortic aneurysm (AAA) screening. You may need this if you are a current or former smoker.  Osteoporosis. You may be screened starting at age 57 if you are at high risk. Talk with your health care provider about your test results, treatment options, and if necessary, the need for more tests. Vaccines  Your health care provider may recommend certain vaccines, such as:  Influenza vaccine. This is recommended every year.  Tetanus, diphtheria, and acellular pertussis (Tdap, Td) vaccine. You may need a Td booster every 10 years.  Zoster vaccine. You may need this after age 11.  Pneumococcal 13-valent conjugate (PCV13) vaccine. One dose is recommended after age  6.  Pneumococcal polysaccharide (PPSV23) vaccine. One dose is recommended after age 69. Talk to your health care provider about which screenings and vaccines you need and how often you need them. This information is not intended to replace advice given to you by your health care provider. Make sure you discuss any questions you have with your health care provider. Document Released: 04/28/2015 Document Revised: 12/20/2015 Document Reviewed: 01/31/2015 Elsevier Interactive Patient Education  2017 [place] Prevention in the Home Falls can cause injuries. They can happen to people of all ages. There are many things you can do to make your home safe and to help prevent falls. What can I do on the outside of my home?  Regularly fix the edges of walkways and driveways and fix any cracks.  Remove anything that might make you trip as you walk through a door, such as a raised step or threshold.  Trim any bushes or trees on the path to your home.  Use bright outdoor lighting.  Clear any walking paths of anything that might make someone trip, such as rocks or tools.  Regularly check to see if handrails are loose or broken. Make sure that both sides of any steps have handrails.  Any raised decks and porches should have guardrails on the edges.  Have any leaves, snow, or ice cleared regularly.  Use sand or salt on walking paths during winter.  Clean up any spills in your garage right away. This includes oil or grease spills. What can I do in the bathroom?  Use night lights.  Install grab bars by the toilet and in the tub and shower. Do not use towel bars as grab bars.  Use non-skid mats or decals in the tub or shower.  If you need to sit down in the shower, use a plastic, non-slip stool.  Keep the floor dry. Clean up any water that spills on the floor as soon as it happens.  Remove soap buildup in the tub or shower regularly.  Attach bath mats securely with double-sided  non-slip rug tape.  Do not have throw rugs and other things on the floor that can make you trip. What can I do in the bedroom?  Use night lights.  Make sure that you have a light by your bed that is easy to reach.  Do not use any sheets or blankets that are too big for your bed. They should not hang down onto the floor.  Have a firm chair that has side arms. You can use this for support while you get dressed.  Do not have throw rugs and other things on the floor that can make you trip. What can I do in the kitchen?  Clean up any spills right away.  Avoid walking on wet floors.  Keep items that you use a lot in easy-to-reach places.  If you need to reach something above you, use a strong step stool that has a grab bar.  Keep electrical cords out of the way.  Do not use floor polish or wax that makes floors slippery. If you must use wax, use non-skid floor wax.  Do not have throw rugs and other things on the floor that can make you trip. What can I do  with my stairs?  Do not leave any items on the stairs.  Make sure that there are handrails on both sides of the stairs and use them. Fix handrails that are broken or loose. Make sure that handrails are as long as the stairways.  Check any carpeting to make sure that it is firmly attached to the stairs. Fix any carpet that is loose or worn.  Avoid having throw rugs at the top or bottom of the stairs. If you do have throw rugs, attach them to the floor with carpet tape.  Make sure that you have a light switch at the top of the stairs and the bottom of the stairs. If you do not have them, ask someone to add them for you. What else can I do to help prevent falls?  Wear shoes that:  Do not have high heels.  Have rubber bottoms.  Are comfortable and fit you well.  Are closed at the toe. Do not wear sandals.  If you use a stepladder:  Make sure that it is fully opened. Do not climb a closed stepladder.  Make sure that both  sides of the stepladder are locked into place.  Ask someone to hold it for you, if possible.  Clearly mark and make sure that you can see:  Any grab bars or handrails.  First and last steps.  Where the edge of each step is.  Use tools that help you move around (mobility aids) if they are needed. These include:  Canes.  Walkers.  Scooters.  Crutches.  Turn on the lights when you go into a dark area. Replace any light bulbs as soon as they burn out.  Set up your furniture so you have a clear path. Avoid moving your furniture around.  If any of your floors are uneven, fix them.  If there are any pets around you, be aware of where they are.  Review your medicines with your doctor. Some medicines can make you feel dizzy. This can increase your chance of falling. Ask your doctor what other things that you can do to help prevent falls. This information is not intended to replace advice given to you by your health care provider. Make sure you discuss any questions you have with your health care provider. Document Released: 01/26/2009 Document Revised: 09/07/2015 Document Reviewed: 05/06/2014 Elsevier Interactive Patient Education  2017 [employer].

## 2020-04-24 NOTE — Progress Notes (Signed)
Subjective:   Ryan Ortega is a 75 y.o. male who presents for Medicare Annual/Subsequent preventive examination.  Review of Systems     Cardiac Risk Factors include: advanced age (>54men, >68 women);sedentary lifestyle     Objective:    There were no vitals filed for this visit. There is no height or weight on file to calculate BMI.  Advanced Directives 04/24/2020 03/23/2020 03/20/2020 11/22/2019 07/23/2019 06/25/2019 05/27/2018  Does Patient Have a Medical Advance Directive? No No No No - No No  Would patient like information on creating a medical advance directive? Yes (MAU/Ambulatory/Procedural Areas - Information given) No - Patient declined No - Patient declined No - Patient declined No - Patient declined No - Patient declined -  Pre-existing out of facility DNR order (yellow form or pink MOST form) - - - - - - -    Current Medications (verified) Outpatient Encounter Medications as of 04/24/2020  Medication Sig  . mirtazapine (REMERON) 15 MG tablet Take 1 tablet (15 mg total) by mouth at bedtime.  Marland Kitchen omeprazole (PRILOSEC) 20 MG capsule Take 1 capsule (20 mg total) by mouth 2 (two) times daily before a meal.  . simethicone (GAS-X) 80 MG chewable tablet Chew 1 tablet (80 mg total) by mouth every 6 (six) hours as needed for flatulence.   Facility-Administered Encounter Medications as of 04/24/2020  Medication  . 0.9 %  sodium chloride infusion    Allergies (verified) Patient has no known allergies.   History: Past Medical History:  Diagnosis Date  . Allergy   . Chronic hepatitis C without mention of hepatic coma   . GERD (gastroesophageal reflux disease)   . H. pylori infection   . Hearing loss of aging   . Internal hemorrhoids without mention of complication   . Lipoma of unspecified site   . Loss of weight   . Mild cognitive impairment, so stated   . Osteoarthrosis, unspecified whether generalized or localized, unspecified site   . Pain in joint, forearm   . Personal  history of colonic adenoma 06/25/2012  . Rash and other nonspecific skin eruption   . Routine general medical examination at a health care facility   . Substance abuse ([place])   . Tinnitus of both ears   . Tobacco use disorder   . Unspecified visual loss    Past Surgical History:  Procedure Laterality Date  . COLONOSCOPY    . left shoulder  1990  . POLYPECTOMY     Family History  Problem Relation Age of Onset  . Cancer Mother   . Stroke Sister   . Breast cancer Sister   . Colitis Neg Hx   . Esophageal cancer Neg Hx   . Stomach cancer Neg Hx   . Rectal cancer Neg Hx   . Colon cancer Neg Hx    Social History   Socioeconomic History  . Marital status: Married    Spouse name: Not on file  . Number of children: 4  . Years of education: Not on file  . Highest education level: Not on file  Occupational History  . Occupation: retired    Fish farm manager: Advertising copywriter  Tobacco Use  . Smoking status: Current Some Day Smoker    Packs/day: 0.25    Years: 40.00    Pack years: 10.00    Types: Cigarettes  . Smokeless tobacco: Never Used  Vaping Use  . Vaping Use: Never used  Substance and Sexual Activity  . Alcohol use: Yes  Alcohol/week: 8.0 standard drinks    Types: 8 Cans of beer per week    Comment: weekly.  . Drug use: Yes    Frequency: 5.0 times per week    Types: Marijuana    Comment: smokes marijuna "every now and then during the week"  . Sexual activity: Yes  Other Topics Concern  . Not on file  Social History Narrative   He is married with 4 children   He is retired from concrete work   Drinks about 18 cans of beer a week   Admits to using some marijuana   He is an intermittent smoker   04/07/2017   Social Determinants of Radio broadcast assistant Strain: Not on file  Food Insecurity: Not on file  Transportation Needs: Not on file  Physical Activity: Not on file  Stress: Not on file  Social Connections: Not on file    Tobacco Counseling Ready to  quit: Not Answered Counseling given: Not Answered   Clinical Intake:  Pre-visit preparation completed: Yes  Pain : No/denies pain     BMI - recorded: 21 Nutritional Status: BMI of 19-24  Normal Nutritional Risks: None Diabetes: No  How often do you need to have someone help you when you read instructions, pamphlets, or other written materials from your doctor or pharmacy?: 5 - Always (unable to read)  Diabetic?no         Activities of Daily Living In your present state of health, do you have any difficulty performing the following activities: 04/24/2020  Hearing? N  Vision? N  Difficulty concentrating or making decisions? N  Dressing or bathing? N  Doing errands, shopping? N  Preparing Food and eating ? N  Using the Toilet? N  In the past six months, have you accidently leaked urine? Y  Do you have problems with loss of bowel control? N  Managing your Medications? N  Managing your Finances? N  Housekeeping or managing your Housekeeping? N  Some recent data might be hidden    Patient Care Team: Gayland Curry, DO as PCP - General (Geriatric Medicine) Gayland Curry, DO (Geriatric Medicine)  Indicate any recent Medical Services you may have received from other than [facility] providers in the past year (date may be approximate).     Assessment:   This is a routine wellness examination for Ryan Ortega.  Hearing/Vision screen  Hearing Screening   125Hz  250Hz  500Hz  1000Hz  2000Hz  3000Hz  4000Hz  6000Hz  8000Hz   Right ear:           Left ear:           Comments: No hearing issues   Vision Screening Comments: Last eye exam was 2021, not sure of date. Patient plans to schedule eye exam in the near future.   Dietary issues and exercise activities discussed:    Goals    . DIET - INCREASE WATER INTAKE     Patient will increase water to 3-4 botles a day     . Quit Smoking     Would like to cut down to 1 pack of cigarettes to last 2 weeks.       Depression Screen PHQ  2/9 Scores 04/24/2020 03/23/2020 07/23/2019 03/19/2019 09/03/2018 04/17/2018 01/22/2018  PHQ - 2 Score 0 0 0 0 0 0 0    Fall Risk Fall Risk  04/24/2020 03/23/2020 03/20/2020 11/22/2019 07/23/2019  Falls in the past year? 0 0 0 0 0  Number falls in past yr: 0 0 0 0  0  Injury with Fall? 0 0 0 0 0    FALL RISK PREVENTION PERTAINING TO THE HOME:  Any stairs in or around the home? No  If so, are there any without handrails? No  Home free of loose throw rugs in walkways, pet beds, electrical cords, etc? Yes  Adequate lighting in your home to reduce risk of falls? Yes   ASSISTIVE DEVICES UTILIZED TO PREVENT FALLS:  Life alert? No  Use of a cane, walker or w/c? No  Grab bars in the bathroom? No  Shower chair or bench in shower? No  Elevated toilet seat or a handicapped toilet? No   TIMED UP AND GO:  Was the test performed? No .    Cognitive Function: MMSE - Mini Mental State Exam 04/17/2018 04/17/2018 04/14/2017 04/12/2016  Not completed: - - - (No Data)  Orientation to time 4 4 5 5   Orientation to Place 4 4 4 4   Registration 3 3 3 3   Attention/ Calculation 0 - 0 0  Attention/Calculation-comments patient can not read - - -  Recall 1 1 0 3  Language- name 2 objects 2 2 2 2   Language- repeat 0 0 1 1  Language- follow 3 step command 3 3 2 2   Language- read & follow direction 0 - 0 0  Language-read & follow direction-comments patient can not read or write - - -  Write a sentence 0 - 0 0  Write a sentence-comments patient can not read or write - - -  Copy design 1 1 0 0  Total score 18 - 17 20     6CIT Screen 04/24/2020  What Year? 0 points  What month? 0 points  What time? 0 points  Count back from 20 4 points  Months in reverse 4 points  Repeat phrase 10 points  Total Score 18    Immunizations Immunization History  Administered Date(s) Administered  . DTaP 07/08/1956  . Fluad Quad(high Dose 65+) 03/19/2019, 03/23/2020  . Hep A / Hep B 09/05/2014, 10/10/2014, 03/20/2015  . Hepatitis  B, adult 09/03/2017, 02/11/2018  . Hepatitis B, ped/adol 07/29/2017  . Influenza, High Dose Seasonal PF 02/17/2017, 01/22/2018  . Influenza,inj,Quad PF,6+ Mos 03/24/2014, 03/17/2015, 12/21/2015  . Moderna SARS-COV2 Booster Vaccination 04/24/2020  . Moderna Sars-Covid-2 Vaccination 06/25/2019, 07/26/2019  . Pneumococcal Conjugate-13 06/24/2014  . Pneumococcal Polysaccharide-23 07/07/2009, 08/03/2012  . Td 07/08/1952  . Tdap 05/17/2013, 03/06/2018  . Varicella 07/07/1957  . Zoster 07/07/2004    TDAP status: Up to date  Flu Vaccine status: Up to date  Pneumococcal vaccine status: Up to date  Covid-19 vaccine status: Completed vaccines  Qualifies for Shingles Vaccine? Yes   Zostavax completed Yes   Shingrix Completed?: No.    Education has been provided regarding the importance of this vaccine. Patient has been advised to call insurance company to determine out of pocket expense if they have not yet received this vaccine. Advised may also receive vaccine at local pharmacy or Health Dept. Verbalized acceptance and understanding.  Screening Tests Health Maintenance  Topic Date Due  . TETANUS/TDAP  03/06/2028  . INFLUENZA VACCINE  Completed  . COVID-19 Vaccine  Completed  . Hepatitis C Screening  Completed  . PNA vac Low Risk Adult  Completed    Health Maintenance  There are no preventive care reminders to display for this patient.  Colorectal cancer screening: Type of screening: Colonoscopy. Completed 2020. Repeat every 10 years  Lung Cancer Screening: (Low Dose CT Chest recommended  if Age 76-80 years, 80 pack-year currently smoking OR have quit w/in 15years.) does not qualify.   Lung Cancer Screening Referral: na  Additional Screening:  Hepatitis C Screening: does not qualify; Completed, hx of hep c  Vision Screening: Recommended annual ophthalmology exams for early detection of glaucoma and other disorders of the eye. Is the patient up to date with their annual eye  exam?  Yes  Who is the provider or what is the name of the office in which the patient attends annual eye exams? Unsure of the name If pt is not established with a provider, would they like to be referred to a provider to establish care? No .   Dental Screening: Recommended annual dental exams for proper oral hygiene  Community Resource Referral / Chronic Care Management: CRR required this visit?  No   CCM required this visit?  No      Plan:     I have personally reviewed and noted the following in the patient's chart:   . Medical and social history . Use of alcohol, tobacco or illicit drugs  . Current medications and supplements . Functional ability and status . Nutritional status . Physical activity . Advanced directives . List of other physicians . Hospitalizations, surgeries, and ER visits in previous 12 months . Vitals . Screenings to include cognitive, depression, and falls . Referrals and appointments  In addition, I have reviewed and discussed with patient certain preventive protocols, quality metrics, and best practice recommendations. A written personalized care plan for preventive services as well as general preventive health recommendations were provided to patient.     Lauree Chandler, NP   04/24/2020    Virtual Visit via Telephone Note  I connected with@ on 04/24/20 at  3:45 PM EST by telephone and verified that I am speaking with the correct person using two identifiers.  Location: Patient: home Provider: [place]   I discussed the limitations, risks, security and privacy concerns of performing an evaluation and management service by telephone and the availability of in person appointments. I also discussed with the patient that there may be a patient responsible charge related to this service. The patient expressed understanding and agreed to proceed.   I discussed the assessment and treatment plan with the patient. The patient was provided an opportunity  to ask questions and all were answered. The patient agreed with the plan and demonstrated an understanding of the instructions.   The patient was advised to call back or seek an in-person evaluation if the symptoms worsen or if the condition fails to improve as anticipated.  I provided 18 minutes of non-face-to-face time during this encounter.  Carlos American. Harle Battiest Avs printed and mailed

## 2020-04-24 NOTE — Progress Notes (Signed)
   This service is provided via telemedicine  No vital signs collected/recorded due to the encounter was a telemedicine visit.   Location of patient (ex: home, work):  Home  Patient consents to a telephone visit: Yes, see telephone visit dated 04/24/20  Location of the provider (ex: office, home):  [facility], Office   Name of any referring provider:  N/A  Names of all persons participating in the telemedicine service and their role in the encounter:  S.Chrae B/CMA, Sherrie Mustache, NP, and Patient   Time spent on call:  11 min with medical assistant

## 2020-04-25 ENCOUNTER — Encounter: Payer: Medicare Other | Admitting: Nurse Practitioner

## 2020-06-05 ENCOUNTER — Ambulatory Visit: Payer: Medicare Other | Admitting: Podiatry

## 2020-06-05 ENCOUNTER — Encounter: Payer: Self-pay | Admitting: Internal Medicine

## 2020-06-15 ENCOUNTER — Other Ambulatory Visit: Payer: Self-pay | Admitting: Internal Medicine

## 2020-06-15 DIAGNOSIS — R1084 Generalized abdominal pain: Secondary | ICD-10-CM

## 2020-06-15 DIAGNOSIS — K219 Gastro-esophageal reflux disease without esophagitis: Secondary | ICD-10-CM

## 2020-07-27 ENCOUNTER — Encounter: Payer: Self-pay | Admitting: Nurse Practitioner

## 2020-07-27 ENCOUNTER — Other Ambulatory Visit: Payer: Self-pay

## 2020-07-27 ENCOUNTER — Ambulatory Visit: Payer: Medicare Other | Admitting: Internal Medicine

## 2020-07-27 ENCOUNTER — Ambulatory Visit (INDEPENDENT_AMBULATORY_CARE_PROVIDER_SITE_OTHER): Payer: Medicare Other | Admitting: Nurse Practitioner

## 2020-07-27 VITALS — BP 130/80 | HR 64 | Temp 97.7°F | Ht 66.0 in | Wt 129.6 lb

## 2020-07-27 DIAGNOSIS — R739 Hyperglycemia, unspecified: Secondary | ICD-10-CM | POA: Diagnosis not present

## 2020-07-27 DIAGNOSIS — R350 Frequency of micturition: Secondary | ICD-10-CM

## 2020-07-27 DIAGNOSIS — K219 Gastro-esophageal reflux disease without esophagitis: Secondary | ICD-10-CM | POA: Diagnosis not present

## 2020-07-27 DIAGNOSIS — I2510 Atherosclerotic heart disease of native coronary artery without angina pectoris: Secondary | ICD-10-CM | POA: Diagnosis not present

## 2020-07-27 DIAGNOSIS — G992 Myelopathy in diseases classified elsewhere: Secondary | ICD-10-CM

## 2020-07-27 DIAGNOSIS — M4802 Spinal stenosis, cervical region: Secondary | ICD-10-CM

## 2020-07-27 DIAGNOSIS — B182 Chronic viral hepatitis C: Secondary | ICD-10-CM

## 2020-07-27 DIAGNOSIS — F1010 Alcohol abuse, uncomplicated: Secondary | ICD-10-CM

## 2020-07-27 DIAGNOSIS — Z720 Tobacco use: Secondary | ICD-10-CM

## 2020-07-27 DIAGNOSIS — R634 Abnormal weight loss: Secondary | ICD-10-CM

## 2020-07-27 NOTE — Progress Notes (Signed)
Careteam: Patient Care Team: Ngetich, Nelda Bucks, NP as PCP - General (Family Medicine) Gayland Curry, DO (Geriatric Medicine)  PLACE OF SERVICE:  [place] Directive information Does Patient Have a Medical Advance Directive?: No  No Known Allergies  Chief Complaint  Patient presents with  . Medical Management of Chronic Issues    4 month follow up.With labs at appointment.     HPI: Patient is a 75 y.o. male routine follow up  Hyperlipidemia- LDL 80, not currently on medication   Hyperglycemia- noted on labs in December. Eats a lot of sugar, drinks a lot of sugary drinks -soda.   Weight loss- only eating 2 meals a day. He is not taking his Remeron which makes him hungry forgets to take it. Continues to drink beer every now and then. Has not had any in about 2 days   GERD- doing well on omeprazole.   Hep C- completed treatment  Tobacco abuse- cutting back- 1 pack will last 3 days.   Review of Systems:  Review of Systems  Constitutional: Negative for chills, fever and weight loss.  HENT: Negative for tinnitus.   Respiratory: Negative for cough, sputum production and shortness of breath.   Cardiovascular: Negative for chest pain, palpitations and leg swelling.  Gastrointestinal: Negative for abdominal pain, constipation, diarrhea and heartburn.  Genitourinary: Negative for dysuria, frequency and urgency.  Musculoskeletal: Negative for back pain, falls, joint pain and myalgias.  Skin: Negative.   Neurological: Positive for tingling. Negative for dizziness and headaches.  Psychiatric/Behavioral: Negative for depression and memory loss. The patient does not have insomnia.     Past Medical History:  Diagnosis Date  . Allergy   . Chronic hepatitis C without mention of hepatic coma   . GERD (gastroesophageal reflux disease)   . H. pylori infection   . Hearing loss of aging   . Internal hemorrhoids without mention of complication   . Lipoma of unspecified  site   . Loss of weight   . Mild cognitive impairment, so stated   . Osteoarthrosis, unspecified whether generalized or localized, unspecified site   . Pain in joint, forearm   . Personal history of colonic adenoma 06/25/2012  . Rash and other nonspecific skin eruption   . Routine general medical examination at a health care facility   . Substance abuse ([place])   . Tinnitus of both ears   . Tobacco use disorder   . Unspecified visual loss    Past Surgical History:  Procedure Laterality Date  . COLONOSCOPY    . left shoulder  1990  . POLYPECTOMY     Social History:   reports that he has been smoking cigarettes. He has a 10.00 pack-year smoking history. He has never used smokeless tobacco. He reports current alcohol use of about 8.0 standard drinks of alcohol per week. He reports current drug use. Frequency: 5.00 times per week. Drug: Marijuana.  Family History  Problem Relation Age of Onset  . Cancer Mother   . Stroke Sister   . Breast cancer Sister   . Colitis Neg Hx   . Esophageal cancer Neg Hx   . Stomach cancer Neg Hx   . Rectal cancer Neg Hx   . Colon cancer Neg Hx     Medications: Patient's Medications  New Prescriptions   No medications on file  Previous Medications   MIRTAZAPINE (REMERON) 15 MG TABLET    Take 1 tablet (15 mg total) by mouth at bedtime.  OMEPRAZOLE (PRILOSEC) 20 MG CAPSULE    TAKE 1 CAPSULE BY MOUTH 2 TIMES DAILY BEFORE A MEAL.   SIMETHICONE (GAS-X) 80 MG CHEWABLE TABLET    Chew 1 tablet (80 mg total) by mouth every 6 (six) hours as needed for flatulence.  Modified Medications   No medications on file  Discontinued Medications   No medications on file    Physical Exam:  Vitals:   07/27/20 0932  BP: 130/80  Pulse: 64  Temp: 97.7 F (36.5 C)  TempSrc: Temporal  SpO2: 98%  Weight: 129 lb 9.6 oz (58.8 kg)  Height: 5\' 6"  (1.676 m)   Body mass index is 20.92 kg/m. Wt Readings from Last 3 Encounters:  07/27/20 129 lb 9.6 oz (58.8 kg)   03/23/20 134 lb 1.6 oz (60.8 kg)  03/20/20 133 lb 6.4 oz (60.5 kg)    Physical Exam Constitutional:      General: He is not in acute distress.    Appearance: He is well-developed. He is not diaphoretic.  HENT:     Head: Normocephalic and atraumatic.     Mouth/Throat:     Pharynx: No oropharyngeal exudate.  Eyes:     Conjunctiva/sclera: Conjunctivae normal.     Pupils: Pupils are equal, round, and reactive to light.  Cardiovascular:     Rate and Rhythm: Normal rate and regular rhythm.     Heart sounds: Normal heart sounds.  Pulmonary:     Effort: Pulmonary effort is normal.     Breath sounds: Normal breath sounds.  Abdominal:     General: Bowel sounds are normal.     Palpations: Abdomen is soft.  Musculoskeletal:        General: No tenderness.     Cervical back: Normal range of motion and neck supple.  Skin:    General: Skin is warm and dry.  Neurological:     Mental Status: He is alert and oriented to person, place, and time. Mental status is at baseline.  Psychiatric:        Mood and Affect: Mood normal.     Labs reviewed: Basic Metabolic Panel: Recent Labs    03/20/20 0815  NA 136  K 3.8  CL 101  CO2 24  GLUCOSE 120*  BUN 9  CREATININE 0.87  CALCIUM 10.2   Liver Function Tests: Recent Labs    03/20/20 0815  AST 13  ALT 9  BILITOT 1.6*  PROT 8.0   No results for input(s): LIPASE, AMYLASE in the last 8760 hours. No results for input(s): AMMONIA in the last 8760 hours. CBC: Recent Labs    03/20/20 0815  WBC 8.3  NEUTROABS 6,350  HGB 15.4  HCT 45.1  MCV 97.6  PLT 212   Lipid Panel: Recent Labs    03/20/20 0815  CHOL 156  HDL 61  LDLCALC 80  TRIG 70  CHOLHDL 2.6   TSH: No results for input(s): TSH in the last 8760 hours. A1C: Lab Results  Component Value Date   HGBA1C 4.3 09/03/2018     Assessment/Plan 1. Gastroesophageal reflux disease, unspecified whether esophagitis present Stable on omeprazole.   2. Alcohol abuse Has  cut back but encouraged cessation  3. Coronary artery disease involving native coronary artery of native heart without angina pectoris Stable, without chest pains, noted from imaging.  -encouraged ASA 81 mg daily  - COMPLETE METABOLIC PANEL WITH GFR  4. Stenosis of cervical spine with myelopathy (HCC) Overall doing well at this time. No increase in  pain reported   5. Chronic hepatitis C without hepatic coma ([place]) Completed treatment per ID.  6. Hyperglycemia -encouraged low sugar diet.  - Hemoglobin A1c - COMPLETE METABOLIC PANEL WITH GFR  7. Urinary frequency - PSA -A1c  8. Weight loss -reports he has forgotten to take remeron recently, he has significant decrease in appetite and weight when not taking. Encouraged to restart at this time.   9. Tobacco abuse -encouraged cessation    Next appt: 3 months Ryan Ortega K. [place], [facility] [phone]

## 2020-07-27 NOTE — Patient Instructions (Signed)
Recommend starting ASA EC - take pill whole daily   RESTART REMERON 15 mg by mouth daily at bedtime.

## 2020-07-28 LAB — HEMOGLOBIN A1C
Hgb A1c MFr Bld: 4.6 % of total Hgb (ref <5.7)
Mean Plasma Glucose: 85 mg/dL
eAG (mmol/L): 4.7 mmol/L

## 2020-07-28 LAB — PSA: PSA: 1.38 ng/mL (ref <=4.0)

## 2020-09-12 ENCOUNTER — Ambulatory Visit: Payer: Medicare Other | Admitting: Podiatry

## 2020-11-01 ENCOUNTER — Ambulatory Visit: Payer: Medicare Other | Admitting: Family

## 2020-11-15 ENCOUNTER — Other Ambulatory Visit: Payer: Self-pay | Admitting: *Deleted

## 2020-11-15 DIAGNOSIS — R634 Abnormal weight loss: Secondary | ICD-10-CM

## 2020-11-15 DIAGNOSIS — R1084 Generalized abdominal pain: Secondary | ICD-10-CM

## 2020-11-15 DIAGNOSIS — K219 Gastro-esophageal reflux disease without esophagitis: Secondary | ICD-10-CM

## 2020-11-15 DIAGNOSIS — R630 Anorexia: Secondary | ICD-10-CM

## 2020-11-15 MED ORDER — OMEPRAZOLE 20 MG PO CPDR: DELAYED_RELEASE_CAPSULE | ORAL | 1 refills | Status: DC

## 2020-11-15 MED ORDER — MIRTAZAPINE 15 MG PO TABS: 15.0000 mg | ORAL_TABLET | Freq: Every day | ORAL | 1 refills | Status: DC

## 2020-11-15 NOTE — Addendum Note (Signed)
Addended by: Rafael Bihari A on: 11/15/2020 10:09 AM   Modules accepted: Orders

## 2020-11-15 NOTE — Telephone Encounter (Signed)
[place]

## 2020-11-22 ENCOUNTER — Ambulatory Visit (INDEPENDENT_AMBULATORY_CARE_PROVIDER_SITE_OTHER): Payer: Medicare Other | Admitting: Family

## 2020-11-22 ENCOUNTER — Encounter: Payer: Self-pay | Admitting: Family

## 2020-11-22 ENCOUNTER — Other Ambulatory Visit: Payer: Self-pay

## 2020-11-22 VITALS — BP 124/82 | HR 80 | Temp 97.7°F | Resp 16 | Ht 66.0 in | Wt 121.0 lb

## 2020-11-22 DIAGNOSIS — J432 Centrilobular emphysema: Secondary | ICD-10-CM | POA: Insufficient documentation

## 2020-11-22 DIAGNOSIS — F1010 Alcohol abuse, uncomplicated: Secondary | ICD-10-CM

## 2020-11-22 DIAGNOSIS — K219 Gastro-esophageal reflux disease without esophagitis: Secondary | ICD-10-CM

## 2020-11-22 DIAGNOSIS — L602 Onychogryphosis: Secondary | ICD-10-CM | POA: Diagnosis not present

## 2020-11-22 DIAGNOSIS — R634 Abnormal weight loss: Secondary | ICD-10-CM

## 2020-11-22 DIAGNOSIS — R1084 Generalized abdominal pain: Secondary | ICD-10-CM | POA: Diagnosis not present

## 2020-11-22 DIAGNOSIS — F17209 Nicotine dependence, unspecified, with unspecified nicotine-induced disorders: Secondary | ICD-10-CM | POA: Diagnosis not present

## 2020-11-22 DIAGNOSIS — B182 Chronic viral hepatitis C: Secondary | ICD-10-CM

## 2020-11-22 DIAGNOSIS — I2510 Atherosclerotic heart disease of native coronary artery without angina pectoris: Secondary | ICD-10-CM | POA: Diagnosis not present

## 2020-11-22 LAB — LIPID PANEL
Cholesterol: 126 mg/dL (ref <200)
HDL: 64 mg/dL (ref >=40)
LDL Cholesterol (Calc): 48 mg/dL (calc)
Non-HDL Cholesterol (Calc): 62 mg/dL (calc) (ref <130)
Total CHOL/HDL Ratio: 2 (calc) (ref <5.0)
Triglycerides: 59 mg/dL (ref <150)

## 2020-11-22 LAB — COMPLETE METABOLIC PANEL WITH GFR
AG Ratio: 1.4 (calc) (ref 1.0–2.5)
ALT: 13 U/L (ref 9–46)
AST: 18 U/L (ref 10–35)
Albumin: 4.5 g/dL (ref 3.6–5.1)
Alkaline phosphatase (APISO): 68 U/L (ref 35–144)
BUN: 8 mg/dL (ref 7–25)
CO2: 26 mmol/L (ref 20–32)
Calcium: 10.1 mg/dL (ref 8.6–10.3)
Chloride: 102 mmol/L (ref 98–110)
Creat: 0.82 mg/dL (ref 0.70–1.28)
Globulin: 3.3 g/dL (calc) (ref 1.9–3.7)
Glucose, Bld: 96 mg/dL (ref 65–99)
Potassium: 4.4 mmol/L (ref 3.5–5.3)
Sodium: 135 mmol/L (ref 135–146)
Total Bilirubin: 1.2 mg/dL (ref 0.2–1.2)
Total Protein: 7.8 g/dL (ref 6.1–8.1)
eGFR: 91 mL/min/{1.73_m2} (ref >=60)

## 2020-11-22 LAB — CBC WITH DIFFERENTIAL/PLATELET
Absolute Monocytes: 469 cells/uL (ref 200–950)
Basophils Absolute: 51 cells/uL (ref 0–200)
Basophils Relative: 1.1 %
Eosinophils Absolute: 152 cells/uL (ref 15–500)
Eosinophils Relative: 3.3 %
HCT: 43.2 % (ref 38.5–50.0)
Hemoglobin: 14.6 g/dL (ref 13.2–17.1)
Lymphs Abs: 2084 cells/uL (ref 850–3900)
MCH: 33 pg (ref 27.0–33.0)
MCHC: 33.8 g/dL (ref 32.0–36.0)
MCV: 97.5 fL (ref 80.0–100.0)
MPV: 10.2 fL (ref 7.5–12.5)
Monocytes Relative: 10.2 %
Neutro Abs: 1845 cells/uL (ref 1500–7800)
Neutrophils Relative %: 40.1 %
Platelets: 184 10*3/uL (ref 140–400)
RBC: 4.43 10*6/uL (ref 4.20–5.80)
RDW: 12.1 % (ref 11.0–15.0)
Total Lymphocyte: 45.3 %
WBC: 4.6 10*3/uL (ref 3.8–10.8)

## 2020-11-22 MED ORDER — OMEPRAZOLE 20 MG PO CPDR: DELAYED_RELEASE_CAPSULE | ORAL | 1 refills | Status: DC

## 2020-11-22 NOTE — Progress Notes (Signed)
Provider: Marlowe Sax FNP-C   Nalda Shackleford, Nelda Bucks, NP  Patient Care Team: Ezrie Bunyan, Nelda Bucks, NP as PCP - General (Family Medicine) Gayland Curry, DO (Geriatric Medicine)  Extended Emergency Contact Information Primary Emergency Contact: [name],[name] Address: [address]          [place] [address] [place] Phone: [phone] Relation: Spouse  Code Status:  Full Code  Goals of care: Advanced Directive information Advanced Directives 11/22/2020  Does Patient Have a Medical Advance Directive? No  Would patient like information on creating a medical advance directive? No - Patient declined  Pre-existing out of facility DNR order (yellow form or pink MOST form) -     Chief Complaint  Patient presents with   Medical Management of Chronic Issues    3 month follow up   Immunizations    Discuss the need for influenza vaccine, shingrix vaccine, and 2nd covid booster.    HPI:  Pt is a 75 y.o. male seen today for 4 months follow up for medical management of chronic diseases.Has a medical history of CAD,chronic Hepatitis without hepatic coma ,GERD,Osteoarthritis,Alcohol abuse,Tobacco disorder,mild Cognitive impairment among other conditions.    CAD - denies any chest pain.not taking baby ASA as directed in the previous.   Weight loss - run out of Remeron last week but  did not call provider's office.eats 2 meals per day.His previous weight was 130 lbs and 121 lbs today.He denies any difficulty swallowing,nausea,vomiting or abdominal pain.   GERD - omeprazole has been effective.denies any dark stool or blood.  Hep C - completed treatment with infectious disease.   Tobacco abuse - smokers a pack of cigarette 2-3 days.Declines medication to help states has cut back a lot.   Alcohol abuse - States drinks 6 bottles of beer. Discuss use of vitamins but declines.  Shingles vaccine due but declines.Education provided but states will not be getting  vaccine.  Also due for 2 nd COVID-19 vaccine.Agrees to get vaccine at his pharmacy.    Past Medical History:  Diagnosis Date   Allergy    Chronic hepatitis C without mention of hepatic coma    GERD (gastroesophageal reflux disease)    H. pylori infection    Hearing loss of aging    Internal hemorrhoids without mention of complication    Lipoma of unspecified site    Loss of weight    Mild cognitive impairment, so stated    Osteoarthrosis, unspecified whether generalized or localized, unspecified site    Pain in joint, forearm    Personal history of colonic adenoma 06/25/2012   Rash and other nonspecific skin eruption    Routine general medical examination at a health care facility    Substance abuse ([place])    Tinnitus of both ears    Tobacco use disorder    Unspecified visual loss    Past Surgical History:  Procedure Laterality Date   COLONOSCOPY     left shoulder  1990   POLYPECTOMY      No Known Allergies  Allergies as of 11/22/2020   No Known Allergies      Medication List        Accurate as of November 22, 2020  8:43 AM. If you have any questions, ask your nurse or doctor.          mirtazapine 15 MG tablet Commonly known as: Remeron Take 1 tablet (15 mg total) by mouth at bedtime.   omeprazole 20 MG capsule Commonly known  as: PRILOSEC TAKE 1 CAPSULE BY MOUTH 2 TIMES DAILY BEFORE A MEAL.   simethicone 80 MG chewable tablet Commonly known as: Gas-X Chew 1 tablet (80 mg total) by mouth every 6 (six) hours as needed for flatulence.        Review of Systems  Constitutional:  Negative for appetite change, chills, fatigue, fever and unexpected weight change.  HENT:  Negative for congestion, dental problem, ear discharge, ear pain, facial swelling, hearing loss, nosebleeds, postnasal drip, rhinorrhea, sinus pressure, sinus pain, sneezing, sore throat, tinnitus and trouble swallowing.   Eyes:  Positive for visual disturbance. Negative for pain, discharge,  redness and itching.       Wears eye glasses has not seen ophthalmology for 1-2 yrs goes to the mall for eye glasses  Respiratory:  Negative for cough, chest tightness, shortness of breath and wheezing.   Cardiovascular:  Negative for chest pain, palpitations and leg swelling.  Gastrointestinal:  Negative for abdominal distention, abdominal pain, blood in stool, constipation, diarrhea, nausea and vomiting.  Endocrine: Negative for cold intolerance, heat intolerance, polydipsia, polyphagia and polyuria.  Genitourinary:  Negative for difficulty urinating, dysuria, flank pain, frequency and urgency.       Voids 2-3 times at night which is usual for him   Musculoskeletal:  Positive for arthralgias. Negative for back pain, gait problem, joint swelling, myalgias, neck pain and neck stiffness.       Left shoulder and hand wears OTC glove   Skin:  Negative for color change, pallor, rash and wound.  Neurological:  Negative for dizziness, syncope, speech difficulty, weakness, light-headedness and headaches.       Tingling on hands   Hematological:  Does not bruise/bleed easily.  Psychiatric/Behavioral:  Negative for agitation, behavioral problems, confusion, hallucinations, self-injury, sleep disturbance and suicidal ideas. The patient is not nervous/anxious.    Immunization History  Administered Date(s) Administered   DTaP 07/08/1956   Fluad Quad(high Dose 65+) 03/19/2019, 03/23/2020   Hep A / Hep B 09/05/2014, 10/10/2014, 03/20/2015   Hepatitis B, adult 09/03/2017, 02/11/2018   Hepatitis B, ped/adol 07/29/2017   Influenza, High Dose Seasonal PF 02/17/2017, 01/22/2018   Influenza,inj,Quad PF,6+ Mos 03/24/2014, 03/17/2015, 12/21/2015   Moderna SARS-COV2 Booster Vaccination 04/24/2020   Moderna Sars-Covid-2 Vaccination 06/25/2019, 07/26/2019   Pneumococcal Conjugate-13 06/24/2014   Pneumococcal Polysaccharide-23 07/07/2009, 08/03/2012   Td 07/08/1952   Tdap 05/17/2013, 03/06/2018   Varicella  07/07/1957   Zoster, Live 07/07/2004   Pertinent  Health Maintenance Due  Topic Date Due   INFLUENZA VACCINE  11/13/2020   PNA vac Low Risk Adult  Completed   Fall Risk  11/22/2020 04/24/2020 03/23/2020 03/20/2020 11/22/2019  Falls in the past year? 0 0 0 0 0  Number falls in past yr: 0 0 0 0 0  Injury with Fall? 0 0 0 0 0  Risk for fall due to : No Fall Risks - - - -  Follow up Falls evaluation completed - - - -   Functional Status Survey:    Vitals:   11/22/20 0839  BP: 124/82  Pulse: 80  Resp: 16  Temp: 97.7 F (36.5 C)  SpO2: 96%  Weight: 121 lb (54.9 kg)  Height: 5' 6" (1.676 m)   Body mass index is 19.53 kg/m. Physical Exam Vitals reviewed.  Constitutional:      General: He is not in acute distress.    Appearance: Normal appearance. He is underweight. He is not ill-appearing or diaphoretic.  HENT:  Head: Normocephalic.     Right Ear: Tympanic membrane, ear canal and external ear normal. There is no impacted cerumen.     Left Ear: Tympanic membrane, ear canal and external ear normal. There is no impacted cerumen.     Nose: Nose normal. No congestion or rhinorrhea.     Mouth/Throat:     Mouth: Mucous membranes are moist.     Pharynx: Oropharynx is clear. No oropharyngeal exudate or posterior oropharyngeal erythema.     Comments: Missing several teeth both upper and lower  Eyes:     General: No scleral icterus.       Right eye: No discharge.        Left eye: No discharge.     Extraocular Movements: Extraocular movements intact.     Conjunctiva/sclera: Conjunctivae normal.     Pupils: Pupils are equal, round, and reactive to light.  Neck:     Vascular: No carotid bruit.  Cardiovascular:     Rate and Rhythm: Normal rate and regular rhythm.     Pulses: Normal pulses.     Heart sounds: Normal heart sounds. No murmur heard.   No friction rub. No gallop.  Pulmonary:     Effort: Pulmonary effort is normal. No respiratory distress.     Breath sounds: Normal breath  sounds. No wheezing, rhonchi or rales.  Chest:     Chest wall: No tenderness.  Abdominal:     General: Bowel sounds are normal. There is no distension.     Palpations: Abdomen is soft. There is no mass.     Tenderness: There is no abdominal tenderness. There is no right CVA tenderness, left CVA tenderness, guarding or rebound.  Musculoskeletal:        General: No swelling or tenderness. Normal range of motion.     Cervical back: Normal range of motion. No rigidity or tenderness.     Right lower leg: No edema.     Left lower leg: No edema.  Feet:     Right foot:     Toenail Condition: Right toenails are abnormally thick and long.     Left foot:     Toenail Condition: Left toenails are abnormally thick and long.  Lymphadenopathy:     Cervical: No cervical adenopathy.  Skin:    General: Skin is warm and dry.     Coloration: Skin is not pale.     Findings: No bruising, erythema, lesion or rash.  Neurological:     Mental Status: He is alert and oriented to person, place, and time.     Cranial Nerves: No cranial nerve deficit.     Sensory: No sensory deficit.     Motor: No weakness.     Coordination: Coordination normal.     Gait: Gait normal.  Psychiatric:        Mood and Affect: Mood normal.        Speech: Speech normal.        Behavior: Behavior normal.        Thought Content: Thought content normal.        Judgment: Judgment normal.    Labs reviewed: Recent Labs    03/20/20 0815  NA 136  K 3.8  CL 101  CO2 24  GLUCOSE 120*  BUN 9  CREATININE 0.87  CALCIUM 10.2   Recent Labs    03/20/20 0815  AST 13  ALT 9  BILITOT 1.6*  PROT 8.0   Recent Labs    03/20/20 0815  WBC 8.3  NEUTROABS 6,350  HGB 15.4  HCT 45.1  MCV 97.6  PLT 212   Lab Results  Component Value Date   TSH 1.04 09/03/2018   Lab Results  Component Value Date   HGBA1C 4.6 07/27/2020   Lab Results  Component Value Date   CHOL 156 03/20/2020   HDL 61 03/20/2020   LDLCALC 80 03/20/2020    TRIG 70 03/20/2020   CHOLHDL 2.6 03/20/2020    Significant Diagnostic Results in last 30 days:  No results found.  Assessment/Plan  1. Coronary artery disease involving native coronary artery of native heart without angina pectoris Chest pain free ASA recommended but declines.Not on Statin latest LDL 80  - CMP with eGFR([facility]) - CBC with Differential/Platelet - Lipid Panel  2. Gastroesophageal reflux disease, unspecified whether esophagitis present Symptoms controlled on Omeprazole  No signs of GI bleed reported. - omeprazole (PRILOSEC) 20 MG capsule; TAKE 1 CAPSULE BY MOUTH 2 TIMES DAILY BEFORE A MEAL.  Dispense: 180 capsule; Refill: 1  3. Alcohol abuse Reports 6 beers per day. Alcohol use reduction advised but thinks has cut down.Recommended AAA but declines.  - Recommended Thiamine and Multivitamin but declines.  - CBC with Differential/Platelet  4. Chronic hepatitis C without hepatic coma ([place]) Completed treated with Infectious disease.  - continue to monitor   5. Weight loss Has had 9 lbs weight loss over four months.run out of Remeron a week ago but has been taking as directed. Eats 2 meals per day. - Recommended Ensure Protein  supplement but declines.  - CMP with eGFR([facility])  6. Overgrown toenails Thick Overgrown toenails  States will call Podiatrist to make an appointment.States was waiting to get 35 dollars for co-pay.Has seen Dr.Galaway Anderson [place] in the past last visit 02/23/2020  7. Centrilobular emphysema ([place]) Reported on CT scan done 06/22/2019  Breathing stable.Not on any inhalers Smoking cessation advised   8. Tobacco use disorder, continuous Not ready to quit  Will continue to encourage cessation   Family/ staff Communication: Reviewed plan of care with patient verbalized understanding.   Labs/tests ordered:  - CBC with Differential/Platelet - CMP with eGFR([facility]) - TSH - Lipid panel  Next Appointment : 6 months for medical management of  chronic issues with Fasting Labs.    Sandrea Hughs, NP

## 2021-05-12 ENCOUNTER — Other Ambulatory Visit: Payer: Self-pay | Admitting: Family

## 2021-05-12 DIAGNOSIS — R630 Anorexia: Secondary | ICD-10-CM

## 2021-05-12 DIAGNOSIS — R634 Abnormal weight loss: Secondary | ICD-10-CM

## 2021-05-15 ENCOUNTER — Encounter: Payer: Self-pay | Admitting: Family

## 2021-05-15 ENCOUNTER — Ambulatory Visit (INDEPENDENT_AMBULATORY_CARE_PROVIDER_SITE_OTHER): Payer: Medicare Other | Admitting: Family

## 2021-05-15 ENCOUNTER — Other Ambulatory Visit: Payer: Self-pay

## 2021-05-15 DIAGNOSIS — Z0000 Encounter for general adult medical examination without abnormal findings: Secondary | ICD-10-CM | POA: Diagnosis not present

## 2021-05-15 NOTE — Patient Instructions (Signed)
Ryan Ortega , Thank you for taking time to come for your Medicare Wellness Visit. I appreciate your ongoing commitment to your health goals. Please review the following plan we discussed and let me know if I can assist you in the future.   Screening recommendations/referrals: Colonoscopy N/A Recommended yearly ophthalmology/optometry visit for glaucoma screening and checkup Recommended yearly dental visit for hygiene and checkup  Vaccinations: Influenza vaccine : Due  Pneumococcal vaccine : Up to date  Tdap vaccine : Up to date  Shingles vaccine : declined     Advanced directives: No   Conditions/risks identified: Advance age male > 78 yrs ,Male Ruth lifestyle ,Smoking cigarettes  Next appointment: 1 year   Preventive Care 38 Years and Older, Male Preventive care refers to lifestyle choices and visits with your health care provider that can promote health and wellness. What does preventive care include? A yearly physical exam. This is also called an annual well check. Dental exams once or twice a year. Routine eye exams. Ask your health care provider how often you should have your eyes checked. Personal lifestyle choices, including: Daily care of your teeth and gums. Regular physical activity. Eating a healthy diet. Avoiding tobacco and drug use. Limiting alcohol use. Practicing safe sex. Taking low doses of aspirin every day. Taking vitamin and mineral supplements as recommended by your health care provider. What happens during an annual well check? The services and screenings done by your health care provider during your annual well check will depend on your age, overall health, lifestyle risk factors, and family history of disease. Counseling  Your health care provider may ask you questions about your: Alcohol use. Tobacco use. Drug use. Emotional well-being. Home and relationship well-being. Sexual activity. Eating habits. History of falls. Memory and  ability to understand (cognition). Work and work Statistician. Screening  You may have the following tests or measurements: Height, weight, and BMI. Blood pressure. Lipid and cholesterol levels. These may be checked every 5 years, or more frequently if you are over 69 years old. Skin check. Lung cancer screening. You may have this screening every year starting at age 42 if you have a 30-pack-year history of smoking and currently smoke or have quit within the past 15 years. Fecal occult blood test (FOBT) of the stool. You may have this test every year starting at age 70. Flexible sigmoidoscopy or colonoscopy. You may have a sigmoidoscopy every 5 years or a colonoscopy every 10 years starting at age 105. Prostate cancer screening. Recommendations will vary depending on your family history and other risks. Hepatitis C blood test. Hepatitis B blood test. Sexually transmitted disease (STD) testing. Diabetes screening. This is done by checking your blood sugar (glucose) after you have not eaten for a while (fasting). You may have this done every 1-3 years. Abdominal aortic aneurysm (AAA) screening. You may need this if you are a current or former smoker. Osteoporosis. You may be screened starting at age 30 if you are at high risk. Talk with your health care provider about your test results, treatment options, and if necessary, the need for more tests. Vaccines  Your health care provider may recommend certain vaccines, such as: Influenza vaccine. This is recommended every year. Tetanus, diphtheria, and acellular pertussis (Tdap, Td) vaccine. You may need a Td booster every 10 years. Zoster vaccine. You may need this after age 39. Pneumococcal 13-valent conjugate (PCV13) vaccine. One dose is recommended after age 29. Pneumococcal polysaccharide (PPSV23) vaccine. One dose is recommended after age  28. Talk to your health care provider about which screenings and vaccines you need and how often you need  them. This information is not intended to replace advice given to you by your health care provider. Make sure you discuss any questions you have with your health care provider. Document Released: 04/28/2015 Document Revised: 12/20/2015 Document Reviewed: 01/31/2015 Elsevier Interactive Patient Education  2017 [place] Prevention in the Home Falls can cause injuries. They can happen to people of all ages. There are many things you can do to make your home safe and to help prevent falls. What can I do on the outside of my home? Regularly fix the edges of walkways and driveways and fix any cracks. Remove anything that might make you trip as you walk through a door, such as a raised step or threshold. Trim any bushes or trees on the path to your home. Use bright outdoor lighting. Clear any walking paths of anything that might make someone trip, such as rocks or tools. Regularly check to see if handrails are loose or broken. Make sure that both sides of any steps have handrails. Any raised decks and porches should have guardrails on the edges. Have any leaves, snow, or ice cleared regularly. Use sand or salt on walking paths during winter. Clean up any spills in your garage right away. This includes oil or grease spills. What can I do in the bathroom? Use night lights. Install grab bars by the toilet and in the tub and shower. Do not use towel bars as grab bars. Use non-skid mats or decals in the tub or shower. If you need to sit down in the shower, use a plastic, non-slip stool. Keep the floor dry. Clean up any water that spills on the floor as soon as it happens. Remove soap buildup in the tub or shower regularly. Attach bath mats securely with double-sided non-slip rug tape. Do not have throw rugs and other things on the floor that can make you trip. What can I do in the bedroom? Use night lights. Make sure that you have a light by your bed that is easy to reach. Do not use  any sheets or blankets that are too big for your bed. They should not hang down onto the floor. Have a firm chair that has side arms. You can use this for support while you get dressed. Do not have throw rugs and other things on the floor that can make you trip. What can I do in the kitchen? Clean up any spills right away. Avoid walking on wet floors. Keep items that you use a lot in easy-to-reach places. If you need to reach something above you, use a strong step stool that has a grab bar. Keep electrical cords out of the way. Do not use floor polish or wax that makes floors slippery. If you must use wax, use non-skid floor wax. Do not have throw rugs and other things on the floor that can make you trip. What can I do with my stairs? Do not leave any items on the stairs. Make sure that there are handrails on both sides of the stairs and use them. Fix handrails that are broken or loose. Make sure that handrails are as long as the stairways. Check any carpeting to make sure that it is firmly attached to the stairs. Fix any carpet that is loose or worn. Avoid having throw rugs at the top or bottom of the stairs. If you do have  throw rugs, attach them to the floor with carpet tape. Make sure that you have a light switch at the top of the stairs and the bottom of the stairs. If you do not have them, ask someone to add them for you. What else can I do to help prevent falls? Wear shoes that: Do not have high heels. Have rubber bottoms. Are comfortable and fit you well. Are closed at the toe. Do not wear sandals. If you use a stepladder: Make sure that it is fully opened. Do not climb a closed stepladder. Make sure that both sides of the stepladder are locked into place. Ask someone to hold it for you, if possible. Clearly mark and make sure that you can see: Any grab bars or handrails. First and last steps. Where the edge of each step is. Use tools that help you move around (mobility aids)  if they are needed. These include: Canes. Walkers. Scooters. Crutches. Turn on the lights when you go into a dark area. Replace any light bulbs as soon as they burn out. Set up your furniture so you have a clear path. Avoid moving your furniture around. If any of your floors are uneven, fix them. If there are any pets around you, be aware of where they are. Review your medicines with your doctor. Some medicines can make you feel dizzy. This can increase your chance of falling. Ask your doctor what other things that you can do to help prevent falls. This information is not intended to replace advice given to you by your health care provider. Make sure you discuss any questions you have with your health care provider. Document Released: 01/26/2009 Document Revised: 09/07/2015 Document Reviewed: 05/06/2014 Elsevier Interactive Patient Education  2017 [employer].

## 2021-05-15 NOTE — Progress Notes (Signed)
This service is provided via telemedicine  No vital signs collected/recorded due to the encounter was a telemedicine visit.   Location of patient (ex: home, work):  Home  Patient consents to a telephone visit:  Yes  Location of the provider (ex: office, home):  [employer].  Name of any referring provider:  Casady Voshell, Nelda Bucks, NP   Names of all persons participating in the telemedicine service and their role in the encounter:  Patient, [name], [place], [place], Webb Silversmith, NP.    Time spent on call:  8 minutes spent on the phone with Medical Assistant.       Subjective:   Ryan Ortega is a 76 y.o. male who presents for Medicare Annual/Subsequent preventive examination.  Review of Systems     Cardiac Risk Factors include: advanced age (>37men, >2 women);male gender;smoking/ tobacco exposure;sedentary lifestyle     Objective:    Today's Vitals   05/15/21 1005  PainSc: 3    There is no height or weight on file to calculate BMI.  Advanced Directives 05/15/2021 11/22/2020 07/27/2020 04/24/2020 03/23/2020 03/20/2020 11/22/2019  Does Patient Have a Medical Advance Directive? No No No No No No No  Would patient like information on creating a medical advance directive? No - Patient declined No - Patient declined - Yes (MAU/Ambulatory/Procedural Areas - Information given) No - Patient declined No - Patient declined No - Patient declined  Pre-existing out of facility DNR order (yellow form or pink MOST form) - - - - - - -    Current Medications (verified) Outpatient Encounter Medications as of 05/15/2021  Medication Sig   mirtazapine (REMERON) 15 MG tablet TAKE 1 TABLET BY MOUTH EVERYDAY AT BEDTIME   omeprazole (PRILOSEC) 20 MG capsule TAKE 1 CAPSULE BY MOUTH 2 TIMES DAILY BEFORE A MEAL.   simethicone (GAS-X) 80 MG chewable tablet Chew 1 tablet (80 mg total) by mouth every 6 (six) hours as needed for flatulence.   Facility-Administered Encounter Medications as of  05/15/2021  Medication   0.9 %  sodium chloride infusion    Allergies (verified) Patient has no known allergies.   History: Past Medical History:  Diagnosis Date   Allergy    Chronic hepatitis C without mention of hepatic coma    GERD (gastroesophageal reflux disease)    H. pylori infection    Hearing loss of aging    Internal hemorrhoids without mention of complication    Lipoma of unspecified site    Loss of weight    Mild cognitive impairment, so stated    Osteoarthrosis, unspecified whether generalized or localized, unspecified site    Pain in joint, forearm    Personal history of colonic adenoma 06/25/2012   Rash and other nonspecific skin eruption    Routine general medical examination at a health care facility    Substance abuse ([place])    Tinnitus of both ears    Tobacco use disorder    Unspecified visual loss    Past Surgical History:  Procedure Laterality Date   COLONOSCOPY     left shoulder  1990   POLYPECTOMY     Family History  Problem Relation Age of Onset   Cancer Mother    Stroke Sister    Breast cancer Sister    Colitis Neg Hx    Esophageal cancer Neg Hx    Stomach cancer Neg Hx    Rectal cancer Neg Hx    Colon cancer Neg Hx    Social History  Socioeconomic History   Marital status: Married    Spouse name: Not on file   Number of children: 4   Years of education: Not on file   Highest education level: Not on file  Occupational History   Occupation: retired    Fish farm manager: [employer]  Tobacco Use   Smoking status: Some Days    Packs/day: 0.25    Years: 40.00    Pack years: 10.00    Types: Cigarettes   Smokeless tobacco: Never  Vaping Use   Vaping Use: Never used  Substance and Sexual Activity   Alcohol use: Yes    Alcohol/week: 8.0 standard drinks    Types: 8 Cans of beer per week    Comment: weekly.   Drug use: Yes    Frequency: 5.0 times per week    Types: Marijuana    Comment: smokes marijuna "every now and then during the  week"   Sexual activity: Yes  Other Topics Concern   Not on file  Social History Narrative   He is married with 4 children   He is retired from concrete work   Drinks about 18 cans of beer a week   Admits to using some marijuana   He is an intermittent smoker   04/07/2017   Social Determinants of Radio broadcast assistant Strain: Not on file  Food Insecurity: Not on file  Transportation Needs: Not on file  Physical Activity: Not on file  Stress: Not on file  Social Connections: Not on file    Tobacco Counseling Ready to quit: Not Answered Counseling given: Not Answered   Clinical Intake:  Pre-visit preparation completed: No  Pain : 0-10 Pain Score: 3  Pain Type: Chronic pain Pain Location: Shoulder Pain Orientation: Right, Left Pain Radiating Towards: no Pain Descriptors / Indicators: Aching Pain Onset: Other (comment) Pain Frequency: Intermittent Pain Relieving Factors: tylenol Effect of Pain on Daily Activities: No  Pain Relieving Factors: tylenol  BMI - recorded: 19.53 Nutritional Status: BMI of 19-24  Normal Nutritional Risks: None Diabetes: No  How often do you need to have someone help you when you read instructions, pamphlets, or other written materials from your doctor or pharmacy?: 1 - Never What is the last grade level you completed in school?: Grade 7  Diabetic?No   Interpreter Needed?: No  Information entered by :: Diandre Merica,FNP-C   Activities of Daily Living In your present state of health, do you have any difficulty performing the following activities: 05/15/2021  Hearing? N  Vision? N  Difficulty concentrating or making decisions? N  Walking or climbing stairs? N  Dressing or bathing? N  Doing errands, shopping? N  Preparing Food and eating ? N  Using the Toilet? N  In the past six months, have you accidently leaked urine? N  Do you have problems with loss of bowel control? N  Managing your Medications? N  Managing your  Finances? N  Housekeeping or managing your Housekeeping? N  Some recent data might be hidden    Patient Care Team: Jaron Czarnecki, Nelda Bucks, NP as PCP - General (Family Medicine) Gayland Curry, DO (Geriatric Medicine)  Indicate any recent Medical Services you may have received from other than [facility] providers in the past year (date may be approximate).     Assessment:   This is a routine wellness examination for Ryan Ortega.  Hearing/Vision screen Hearing Screening - Comments:: No Hearing Concerns.  Vision Screening - Comments:: No Vision Concerns. Patient wears prescription glasses.  Patient last eye was 2021.  Dietary issues and exercise activities discussed: Current Exercise Habits: Home exercise routine, Type of exercise: walking, Time (Minutes): 15, Frequency (Times/Week): 2, Weekly Exercise (Minutes/Week): 30, Intensity: Mild, Exercise limited by: None identified   Goals Addressed             This Visit's Progress    DIET - INCREASE WATER INTAKE   On track    Patient will increase water to 3-4 botles a day      Quit Smoking   Not on track    Would like to cut down to 1 pack of cigarettes to last 2 weeks.        Depression Screen PHQ 2/9 Scores 05/15/2021 04/24/2020 03/23/2020 07/23/2019 03/19/2019 09/03/2018 04/17/2018  PHQ - 2 Score 0 0 0 0 0 0 0    Fall Risk Fall Risk  05/15/2021 11/22/2020 04/24/2020 03/23/2020 03/20/2020  Falls in the past year? 0 0 0 0 0  Number falls in past yr: 0 0 0 0 0  Injury with Fall? 0 0 0 0 0  Risk for fall due to : No Fall Risks No Fall Risks - - -  Follow up Falls evaluation completed Falls evaluation completed - - -    FALL RISK PREVENTION PERTAINING TO THE HOME:  Any stairs in or around the home? No  If so, are there any without handrails? No  Home free of loose throw rugs in walkways, pet beds, electrical cords, etc? No  Adequate lighting in your home to reduce risk of falls? Yes   ASSISTIVE DEVICES UTILIZED TO PREVENT FALLS:  Life alert? No   Use of a cane, walker or w/c? No  Grab bars in the bathroom? Yes  Shower chair or bench in shower? Yes  Elevated toilet seat or a handicapped toilet? No   TIMED UP AND GO:  Was the test performed? No .  Length of time to ambulate 10 feet: N/A sec.   Gait steady and fast without use of assistive device  Cognitive Function: MMSE - Mini Mental State Exam 04/17/2018 04/17/2018 04/14/2017 04/12/2016  Not completed: - - - (No Data)  Orientation to time 4 4 5 5   Orientation to Place 4 4 4 4   Registration 3 3 3 3   Attention/ Calculation 0 - 0 0  Attention/Calculation-comments patient can not read - - -  Recall 1 1 0 3  Language- name 2 objects 2 2 2 2   Language- repeat 0 0 1 1  Language- follow 3 step command 3 3 2 2   Language- read & follow direction 0 - 0 0  Language-read & follow direction-comments patient can not read or write - - -  Write a sentence 0 - 0 0  Write a sentence-comments patient can not read or write - - -  Copy design 1 1 0 0  Total score 18 - 17 20     6CIT Screen 05/15/2021 04/24/2020  What Year? 0 points 0 points  What month? 0 points 0 points  What time? 0 points 0 points  Count back from 20 4 points 4 points  Months in reverse 4 points 4 points  Repeat phrase 10 points 10 points  Total Score 18 18    Immunizations Immunization History  Administered Date(s) Administered   DTaP 07/08/1956   Fluad Quad(high Dose 65+) 03/19/2019, 03/23/2020   Hep A / Hep B 09/05/2014, 10/10/2014, 03/20/2015   Hepatitis B, adult 09/03/2017, 02/11/2018   Hepatitis B,  ped/adol 07/29/2017   Influenza, High Dose Seasonal PF 02/17/2017, 01/22/2018   Influenza,inj,Quad PF,6+ Mos 03/24/2014, 03/17/2015, 12/21/2015   Moderna SARS-COV2 Booster Vaccination 04/24/2020   Moderna Sars-Covid-2 Vaccination 06/25/2019, 07/26/2019   Pneumococcal Conjugate-13 06/24/2014   Pneumococcal Polysaccharide-23 07/07/2009, 08/03/2012   Td 07/08/1952   Tdap 05/17/2013, 03/06/2018   Varicella  07/07/1957   Zoster, Live 07/07/2004    TDAP status: Up to date  Flu Vaccine status: Due, Education has been provided regarding the importance of this vaccine. Advised may receive this vaccine at local pharmacy or Health Dept. Aware to provide a copy of the vaccination record if obtained from local pharmacy or Health Dept. Verbalized acceptance and understanding.  Pneumococcal vaccine status: Up to date  Covid-19 vaccine status: Information provided on how to obtain vaccines.   Qualifies for Shingles Vaccine? Yes   Zostavax completed No   Shingrix Completed?: No.    Education has been provided regarding the importance of this vaccine. Patient has been advised to call insurance company to determine out of pocket expense if they have not yet received this vaccine. Advised may also receive vaccine at local pharmacy or Health Dept. Verbalized acceptance and understanding.  Screening Tests Health Maintenance  Topic Date Due   COVID-19 Vaccine (3 - Booster for Moderna series) 06/19/2020   INFLUENZA VACCINE  11/13/2020   TETANUS/TDAP  03/06/2028   Pneumonia Vaccine 51+ Years old  Completed   Hepatitis C Screening  Completed   HPV VACCINES  Aged Out   Zoster Vaccines- Shingrix  Discontinued    Health Maintenance  Health Maintenance Due  Topic Date Due   COVID-19 Vaccine (3 - Booster for Moderna series) 06/19/2020   INFLUENZA VACCINE  11/13/2020    Colorectal cancer screening: No longer required.   Lung Cancer Screening: (Low Dose CT Chest recommended if Age 70-80 years, 30 pack-year currently smoking OR have quit w/in 15years.) does qualify.   Lung Cancer Screening Referral: No completed 06/23/2019   Additional Screening:  Hepatitis C Screening: does qualify; Completed Yes   Vision Screening: Recommended annual ophthalmology exams for early detection of glaucoma and other disorders of the eye. Is the patient up to date with their annual eye exam?  Yes  Who is the provider or  what is the name of the office in which the patient attends annual eye exams? Goes to [facility] care  If pt is not established with a provider, would they like to be referred to a provider to establish care? No .   Dental Screening: Recommended annual dental exams for proper oral hygiene  Community Resource Referral / Chronic Care Management: CRR required this visit?  No   CCM required this visit?  No      Plan:     I have personally reviewed and noted the following in the patients chart:   Medical and social history Use of alcohol, tobacco or illicit drugs  Current medications and supplements including opioid prescriptions. Patient is not currently taking opioid prescriptions. Functional ability and status Nutritional status Physical activity Advanced directives List of other physicians Hospitalizations, surgeries, and ER visits in previous 12 months Vitals Screenings to include cognitive, depression, and falls Referrals and appointments  In addition, I have reviewed and discussed with patient certain preventive protocols, quality metrics, and best practice recommendations. A written personalized care plan for preventive services as well as general preventive health recommendations were provided to patient.     Sandrea Hughs, NP   05/15/2021  Nurse Notes:  - states will get Influenza vaccine during upcoming visit here on 05/25/2021  - will get COVID-19 vaccine at the pharmacy.

## 2021-05-24 ENCOUNTER — Encounter: Payer: Self-pay | Admitting: Family

## 2021-05-25 ENCOUNTER — Other Ambulatory Visit: Payer: Self-pay

## 2021-05-25 ENCOUNTER — Other Ambulatory Visit: Payer: Medicare Other

## 2021-05-25 ENCOUNTER — Encounter: Payer: Self-pay | Admitting: Family

## 2021-05-25 ENCOUNTER — Ambulatory Visit (INDEPENDENT_AMBULATORY_CARE_PROVIDER_SITE_OTHER): Payer: Medicare Other | Admitting: Family

## 2021-05-25 VITALS — BP 126/82 | HR 63 | Temp 97.7°F | Ht 65.0 in | Wt 131.4 lb

## 2021-05-25 DIAGNOSIS — J432 Centrilobular emphysema: Secondary | ICD-10-CM

## 2021-05-25 DIAGNOSIS — Z23 Encounter for immunization: Secondary | ICD-10-CM

## 2021-05-25 DIAGNOSIS — K219 Gastro-esophageal reflux disease without esophagitis: Secondary | ICD-10-CM | POA: Diagnosis not present

## 2021-05-25 DIAGNOSIS — F17209 Nicotine dependence, unspecified, with unspecified nicotine-induced disorders: Secondary | ICD-10-CM | POA: Diagnosis not present

## 2021-05-25 DIAGNOSIS — I2510 Atherosclerotic heart disease of native coronary artery without angina pectoris: Secondary | ICD-10-CM | POA: Diagnosis not present

## 2021-05-25 DIAGNOSIS — G992 Myelopathy in diseases classified elsewhere: Secondary | ICD-10-CM | POA: Diagnosis not present

## 2021-05-25 DIAGNOSIS — B182 Chronic viral hepatitis C: Secondary | ICD-10-CM

## 2021-05-25 DIAGNOSIS — M4802 Spinal stenosis, cervical region: Secondary | ICD-10-CM

## 2021-05-25 LAB — COMPLETE METABOLIC PANEL WITH GFR
AG Ratio: 1.3 (calc) (ref 1.0–2.5)
ALT: 12 U/L (ref 9–46)
AST: 19 U/L (ref 10–35)
Albumin: 4.2 g/dL (ref 3.6–5.1)
Alkaline phosphatase (APISO): 69 U/L (ref 35–144)
BUN: 8 mg/dL (ref 7–25)
CO2: 26 mmol/L (ref 20–32)
Calcium: 9.4 mg/dL (ref 8.6–10.3)
Chloride: 105 mmol/L (ref 98–110)
Creat: 0.9 mg/dL (ref 0.70–1.28)
Globulin: 3.2 g/dL (calc) (ref 1.9–3.7)
Glucose, Bld: 88 mg/dL (ref 65–99)
Potassium: 4.4 mmol/L (ref 3.5–5.3)
Sodium: 138 mmol/L (ref 135–146)
Total Bilirubin: 1.1 mg/dL (ref 0.2–1.2)
Total Protein: 7.4 g/dL (ref 6.1–8.1)
eGFR: 89 mL/min/{1.73_m2} (ref >=60)

## 2021-05-25 LAB — CBC WITH DIFFERENTIAL/PLATELET
Absolute Monocytes: 428 cells/uL (ref 200–950)
Basophils Absolute: 52 cells/uL (ref 0–200)
Basophils Relative: 1.1 %
Eosinophils Absolute: 118 cells/uL (ref 15–500)
Eosinophils Relative: 2.5 %
HCT: 39.9 % (ref 38.5–50.0)
Hemoglobin: 13.8 g/dL (ref 13.2–17.1)
Lymphs Abs: 1833 cells/uL (ref 850–3900)
MCH: 34.4 pg — ABNORMAL HIGH (ref 27.0–33.0)
MCHC: 34.6 g/dL (ref 32.0–36.0)
MCV: 99.5 fL (ref 80.0–100.0)
MPV: 10.1 fL (ref 7.5–12.5)
Monocytes Relative: 9.1 %
Neutro Abs: 2270 cells/uL (ref 1500–7800)
Neutrophils Relative %: 48.3 %
Platelets: 201 10*3/uL (ref 140–400)
RBC: 4.01 10*6/uL — ABNORMAL LOW (ref 4.20–5.80)
RDW: 11.7 % (ref 11.0–15.0)
Total Lymphocyte: 39 %
WBC: 4.7 10*3/uL (ref 3.8–10.8)

## 2021-05-25 LAB — LIPID PANEL
Cholesterol: 142 mg/dL (ref <200)
HDL: 62 mg/dL (ref >=40)
LDL Cholesterol (Calc): 64 mg/dL (calc)
Non-HDL Cholesterol (Calc): 80 mg/dL (calc) (ref <130)
Total CHOL/HDL Ratio: 2.3 (calc) (ref <5.0)
Triglycerides: 82 mg/dL (ref <150)

## 2021-05-25 NOTE — Progress Notes (Signed)
Provider: Marlowe Sax FNP-C   , Nelda Bucks, NP  Patient Care Team: , Nelda Bucks, NP as PCP - General (Family Medicine) Gayland Curry, DO (Geriatric Medicine)  Extended Emergency Contact Information Primary Emergency Contact: [name],[name] Address: [place]          [place] [address] [place] Phone: [phone] Relation: Spouse  Code Status:  Full Code  Goals of care: Advanced Directive information Advanced Directives 05/24/2021  Does Patient Have a Medical Advance Directive? No  Would patient like information on creating a medical advance directive? No - Patient declined  Pre-existing out of facility DNR order (yellow form or pink MOST form) -     Chief Complaint  Patient presents with   Medical Management of Chronic Issues    6 month follow up.   Immunizations    Discuss the need for Covid Booster, and Influenza Vaccine.    HPI:  Pt is a 76 y.o. male seen today for 6 months follow up for medical management of chronic diseases. He denies any acute issues today.  CAD - denies any chest pains.Not on statin or antiplatelets declines medication   Smoking cigarette - one pack for 3 days  not ready to quit.  GERD - symptoms have improved taking omeprazole before meals.  Weight loss - has gained weight and appetite has improved on Remeron.   Health maintenance :  Due for 3rd COVID-19 booster vaccine reminded to get vaccine at the pharmacy  He will get his Influenza vaccine today   Past Medical History:  Diagnosis Date   Allergy    Chronic hepatitis C without mention of hepatic coma    GERD (gastroesophageal reflux disease)    H. pylori infection    Hearing loss of aging    Internal hemorrhoids without mention of complication    Lipoma of unspecified site    Loss of weight    Mild cognitive impairment, so stated    Osteoarthrosis, unspecified whether generalized or localized, unspecified site    Pain in joint, forearm     Personal history of colonic adenoma 06/25/2012   Rash and other nonspecific skin eruption    Routine general medical examination at a health care facility    Substance abuse ([place])    Tinnitus of both ears    Tobacco use disorder    Unspecified visual loss    Past Surgical History:  Procedure Laterality Date   COLONOSCOPY     left shoulder  1990   POLYPECTOMY      No Known Allergies  Allergies as of 05/25/2021   No Known Allergies      Medication List        Accurate as of May 25, 2021  9:12 AM. If you have any questions, ask your nurse or doctor.          mirtazapine 15 MG tablet Commonly known as: REMERON TAKE 1 TABLET BY MOUTH EVERYDAY AT BEDTIME   omeprazole 20 MG capsule Commonly known as: PRILOSEC TAKE 1 CAPSULE BY MOUTH 2 TIMES DAILY BEFORE A MEAL.   simethicone 80 MG chewable tablet Commonly known as: Gas-X Chew 1 tablet (80 mg total) by mouth every 6 (six) hours as needed for flatulence.        Review of Systems  Constitutional:  Negative for appetite change, chills, fatigue, fever and unexpected weight change.  HENT:  Negative for congestion, dental problem, ear discharge, ear pain, facial swelling, hearing loss, nosebleeds,  postnasal drip, rhinorrhea, sinus pressure, sinus pain, sneezing, sore throat, tinnitus and trouble swallowing.   Eyes:  Positive for visual disturbance. Negative for pain, discharge, redness and itching.       Wears eye glasses not seen ophthalmology   Respiratory:  Negative for cough, chest tightness, shortness of breath and wheezing.   Cardiovascular:  Negative for chest pain, palpitations and leg swelling.  Gastrointestinal:  Negative for abdominal distention, abdominal pain, blood in stool, constipation, diarrhea, nausea and vomiting.  Endocrine: Negative for cold intolerance, heat intolerance, polydipsia, polyphagia and polyuria.  Genitourinary:  Negative for difficulty urinating, dysuria, flank pain, frequency and  urgency.  Musculoskeletal:  Negative for arthralgias, back pain, gait problem, joint swelling, myalgias, neck pain and neck stiffness.  Skin:  Negative for color change, pallor, rash and wound.  Neurological:  Negative for dizziness, syncope, speech difficulty, weakness, light-headedness, numbness and headaches.  Hematological:  Does not bruise/bleed easily.  Psychiatric/Behavioral:  Negative for agitation, behavioral problems, confusion, hallucinations, self-injury, sleep disturbance and suicidal ideas. The patient is not nervous/anxious.    Immunization History  Administered Date(s) Administered   DTaP 07[birth year]/1955   Fluad Quad(high Dose 65+) 03/19/2019, 03/23/2020, 05/25/2021   Hep A / Hep B 09/05/2014, 10/10/2014, 03/20/2015   Hepatitis B, adult 09/03/2017, 02/11/2018   Hepatitis B, ped/adol 07/29/2017   Influenza, High Dose Seasonal PF 02/17/2017, 01/22/2018   Influenza,inj,Quad PF,6+ Mos 03/24/2014, 03/17/2015, 12/21/2015   Moderna SARS-COV2 Booster Vaccination 04/24/2020   Moderna Sars-Covid-2 Vaccination 06/25/2019, 07/26/2019   Pneumococcal Conjugate-13 06/24/2014   Pneumococcal Polysaccharide-23 07/07/2009, 08/03/2012   Td 07/08/1951   Tdap 05/17/2013, 03/06/2018   Varicella 07/07/1957   Zoster, Live 07/07/2004   Pertinent  Health Maintenance Due  Topic Date Due   INFLUENZA VACCINE  Completed   Fall Risk 03/23/2020 04/24/2020 11/22/2020 05/15/2021 05/24/2021  Falls in the past year? 0 0 0 0 0  Was there an injury with Fall? 0 0 0 0 0  Fall Risk Category Calculator 0 0 0 0 0  Fall Risk Category _0   Patient Fall Risk Level _1   Patient at Risk for Falls Due to - - No Fall Risks No Fall Risks No Fall Risks  Fall risk Follow up - - Falls evaluation completed Falls evaluation completed Falls evaluation completed   Functional Status Survey:    Vitals:   05/25/21 0854  BP: 126/82  Pulse: 63   Temp: 97.7 F (36.5 C)  SpO2: 90%  Weight: 131 lb 6.4 oz (59.6 kg)  Height: 5' 5" (1.651 m)   Body mass index is 21.87 kg/m. Physical Exam Vitals reviewed.  Constitutional:      General: He is not in acute distress.    Appearance: Normal appearance. He is normal weight. He is not ill-appearing or diaphoretic.  HENT:     Head: Normocephalic.     Right Ear: Tympanic membrane, ear canal and external ear normal. There is no impacted cerumen.     Left Ear: Tympanic membrane, ear canal and external ear normal. There is no impacted cerumen.     Nose: Nose normal. No congestion or rhinorrhea.     Mouth/Throat:     Mouth: Mucous membranes are moist.     Pharynx: Oropharynx is clear. No oropharyngeal exudate or posterior oropharyngeal erythema.  Eyes:     General: No scleral icterus.       Right eye: No discharge.  Left eye: No discharge.     Extraocular Movements: Extraocular movements intact.     Conjunctiva/sclera: Conjunctivae normal.     Pupils: Pupils are equal, round, and reactive to light.  Neck:     Vascular: No carotid bruit.  Cardiovascular:     Rate and Rhythm: Normal rate and regular rhythm.     Pulses: Normal pulses.     Heart sounds: Normal heart sounds. No murmur heard.   No friction rub. No gallop.  Pulmonary:     Effort: Pulmonary effort is normal. No respiratory distress.     Breath sounds: Normal breath sounds. No wheezing, rhonchi or rales.  Chest:     Chest wall: No tenderness.  Abdominal:     General: Bowel sounds are normal. There is no distension.     Palpations: Abdomen is soft. There is no mass.     Tenderness: There is no abdominal tenderness. There is no right CVA tenderness, left CVA tenderness, guarding or rebound.  Musculoskeletal:        General: No swelling or tenderness. Normal range of motion.     Cervical back: Normal range of motion. No rigidity or tenderness.     Right lower leg: No edema.     Left lower leg: No edema.   Lymphadenopathy:     Cervical: No cervical adenopathy.  Skin:    General: Skin is warm and dry.     Coloration: Skin is not pale.     Findings: No bruising, erythema, lesion or rash.  Neurological:     Mental Status: He is alert and oriented to person, place, and time.     Cranial Nerves: No cranial nerve deficit.     Sensory: No sensory deficit.     Motor: No weakness.     Coordination: Coordination normal.     Gait: Gait normal.  Psychiatric:        Mood and Affect: Mood normal.        Speech: Speech normal.        Behavior: Behavior normal.        Thought Content: Thought content normal.        Judgment: Judgment normal.    Labs reviewed: Recent Labs    11/22/20 0911  NA 135  K 4.4  CL 102  CO2 26  GLUCOSE 96  BUN 8  CREATININE 0.82  CALCIUM 10.1   Recent Labs    11/22/20 0911  AST 18  ALT 13  BILITOT 1.2  PROT 7.8   Recent Labs    11/22/20 0911  WBC 4.6  NEUTROABS 1,845  HGB 14.6  HCT 43.2  MCV 97.5  PLT 184   Lab Results  Component Value Date   TSH 1.04 09/03/2018   Lab Results  Component Value Date   HGBA1C 4.6 07/27/2020   Lab Results  Component Value Date   CHOL 126 11/22/2020   HDL 64 11/22/2020   LDLCALC 48 11/22/2020   TRIG 59 11/22/2020   CHOLHDL 2.0 11/22/2020    Significant Diagnostic Results in last 30 days:  No results found.  Assessment/Plan  1. Need for influenza vaccination Afebrile. Flut shot administered by CMA no acute reaction reported.  - Flu Vaccine QUAD High Dose(Fluad)  2. Centrilobular emphysema (HCC) Breathing stable  Occasional smoker's cough  - CBC with Differential/Platelet - CMP with eGFR([facility])  3. Gastroesophageal reflux disease, unspecified whether esophagitis present Symptoms stable  Reports no dark or tarry stool  - continue on Omeprazole  -  CBC with Differential/Platelet  4. Coronary artery disease involving native coronary artery of native heart without angina pectoris Chest pain free   B/p well controlled  - Lipid Panel  5. Tobacco use disorder, continuous Smokes 1 pack for 3 days. - declines nicotine patch  - smoking cessation advised but not ready to quit    6. Stenosis of cervical spine with myelopathy (HCC) Pain under control.  7. Chronic hepatitis C without hepatic coma ([facility]) Asymptomatic  Levels within normal 02/11/2018 completed treatment.   Family/ staff Communication: Reviewed plan of care with patient verbalized understanding   Labs/tests ordered:  - CBC with Differential/Platelet - CMP with eGFR([facility]) - TSH - Lipid panel  Next Appointment : 6 months for medical management of chronic issues.   Sandrea Hughs, NP

## 2021-08-22 ENCOUNTER — Ambulatory Visit: Payer: Medicare Other | Admitting: Podiatry

## 2021-08-22 ENCOUNTER — Encounter: Payer: Self-pay | Admitting: Podiatry

## 2021-08-22 DIAGNOSIS — M79675 Pain in left toe(s): Secondary | ICD-10-CM

## 2021-08-22 DIAGNOSIS — B351 Tinea unguium: Secondary | ICD-10-CM | POA: Diagnosis not present

## 2021-08-22 DIAGNOSIS — M79674 Pain in right toe(s): Secondary | ICD-10-CM | POA: Diagnosis not present

## 2021-08-22 NOTE — Progress Notes (Signed)
This patient presents to the office with chief complaint of long thick painful nails.  Patient says the nails are painful walking and wearing shoes.  This patient is unable to self treat.  This patient is unable to trim her nails since she is unable to reach her nails.  She presents to the office for preventative foot care services. Patient has not been seen in about 2 years.  General Appearance  Alert, conversant and in no acute stress.  Vascular  Dorsalis pedis and posterior tibial  pulses are palpable  bilaterally.  Capillary return is within normal limits  bilaterally. Temperature is within normal limits  bilaterally.  Neurologic  Senn-Weinstein monofilament wire test within normal limits  bilaterally. Muscle power within normal limits bilaterally.  Nails Thick disfigured discolored nails with subungual debris  from hallux to fifth toes bilaterally. No evidence of bacterial infection or drainage bilaterally.  Orthopedic  No limitations of motion  feet .  No crepitus or effusions noted.  No bony pathology or digital deformities noted.  Skin  normotropic skin with no porokeratosis noted bilaterally.  No signs of infections or ulcers noted.     Onychomycosis  Nails  B/L.  Pain in right toes  Pain in left toes  Debridement of nails both feet followed trimming the nails with dremel tool.    RTC 3 months.   Arliene Rosenow DPM   

## 2021-10-24 ENCOUNTER — Other Ambulatory Visit: Payer: Self-pay | Admitting: Family

## 2021-10-24 DIAGNOSIS — R1084 Generalized abdominal pain: Secondary | ICD-10-CM

## 2021-10-24 DIAGNOSIS — K219 Gastro-esophageal reflux disease without esophagitis: Secondary | ICD-10-CM

## 2021-11-22 ENCOUNTER — Encounter: Payer: Self-pay | Admitting: Family

## 2021-11-22 ENCOUNTER — Ambulatory Visit (INDEPENDENT_AMBULATORY_CARE_PROVIDER_SITE_OTHER): Payer: Medicare Other | Admitting: Family

## 2021-11-22 VITALS — BP 110/80 | HR 73 | Temp 97.1°F | Resp 16 | Ht 65.0 in | Wt 131.4 lb

## 2021-11-22 DIAGNOSIS — I2510 Atherosclerotic heart disease of native coronary artery without angina pectoris: Secondary | ICD-10-CM

## 2021-11-22 DIAGNOSIS — K219 Gastro-esophageal reflux disease without esophagitis: Secondary | ICD-10-CM

## 2021-11-22 DIAGNOSIS — J432 Centrilobular emphysema: Secondary | ICD-10-CM

## 2021-11-22 DIAGNOSIS — R2232 Localized swelling, mass and lump, left upper limb: Secondary | ICD-10-CM | POA: Diagnosis not present

## 2021-11-22 DIAGNOSIS — F17209 Nicotine dependence, unspecified, with unspecified nicotine-induced disorders: Secondary | ICD-10-CM | POA: Diagnosis not present

## 2021-11-22 NOTE — Assessment & Plan Note (Signed)
Soft multiple mass with different sizes along radial wrist area.  Nontender to palpate without any signs of erythema. -Referral to general surgery for evaluation

## 2021-11-22 NOTE — Progress Notes (Signed)
Provider: Marlowe Sax FNP-C   , Nelda Bucks, NP  Patient Care Team: , Nelda Bucks, NP as PCP - General (Family Medicine) Gayland Curry, DO (Geriatric Medicine)  Extended Emergency Contact Information Primary Emergency Contact: [name],[name] Address: [place]          [place] [address] [place] Phone: [phone] Relation: Spouse  Code Status:  Full Code  Goals of care: Advanced Directive information    11/22/2021   10:00 AM  Advanced Directives  Does Patient Have a Medical Advance Directive? No  Would patient like information on creating a medical advance directive? No - Patient declined     Chief Complaint  Patient presents with   Medical Management of Chronic Issues    6 month follow up.   Immunizations    Discuss the need for Covid Booster, and Influenza vaccine.    HPI:  Pt is a 76 y.o. male seen today for 6 months follow up for medical management of chronic diseases.  Has a medical history of hyperlipidemia, GERD, chronic hepatitis C completed treatment, tobacco abuse, alcohol use, central lobular emphysema, depression among others  He complains pain and swelling on left wrist x 3 months. States arthritis is bad on the hands cannot bend at times.  Has rubbing alcohol without any relief.   GERD - continue to require Omeprazole daily   Cigarette - Pack a cigarette last three days.   Alcohol uses - states drinks 6 -8 bottles per day. Drinks with friends.    Depression -  on Remeron which has been effect  Walks every now and then but not routine.  Due for COVID booster vaccine reminded to get vaccine at the pharmacy.  Also reminded to get influenza vaccine either the pharmacy or PCP office  Past Medical History:  Diagnosis Date   Allergy    Chronic hepatitis C without mention of hepatic coma    GERD (gastroesophageal reflux disease)    H. pylori infection    Hearing loss of aging    Internal hemorrhoids without  mention of complication    Lipoma of unspecified site    Loss of weight    Mild cognitive impairment, so stated    Osteoarthrosis, unspecified whether generalized or localized, unspecified site    Pain in joint, forearm    Personal history of colonic adenoma 06/25/2012   Rash and other nonspecific skin eruption    Routine general medical examination at a health care facility    Substance abuse ([place])    Tinnitus of both ears    Tobacco use disorder    Unspecified visual loss    Past Surgical History:  Procedure Laterality Date   COLONOSCOPY     left shoulder  1990   POLYPECTOMY      No Known Allergies  Allergies as of 11/22/2021   No Known Allergies      Medication List        Accurate as of November 22, 2021 10:27 AM. If you have any questions, ask your nurse or doctor.          mirtazapine 15 MG tablet Commonly known as: REMERON TAKE 1 TABLET BY MOUTH EVERYDAY AT BEDTIME   omeprazole 20 MG capsule Commonly known as: PRILOSEC TAKE 1 CAPSULE BY MOUTH 2 TIMES DAILY BEFORE A MEAL.   simethicone 80 MG chewable tablet Commonly known as: Gas-X Chew 1 tablet (80 mg total) by mouth every 6 (six) hours as needed for flatulence.  Review of Systems  Constitutional:  Negative for appetite change, chills, fatigue, fever and unexpected weight change.  HENT:  Negative for congestion, dental problem, ear discharge, ear pain, facial swelling, hearing loss, nosebleeds, postnasal drip, rhinorrhea, sinus pressure, sinus pain, sneezing, sore throat, tinnitus and trouble swallowing.   Eyes:  Negative for pain, discharge, redness, itching and visual disturbance.  Respiratory:  Negative for cough, chest tightness, shortness of breath and wheezing.   Cardiovascular:  Negative for chest pain, palpitations and leg swelling.  Gastrointestinal:  Negative for abdominal distention, abdominal pain, blood in stool, constipation, diarrhea, nausea and vomiting.  Endocrine: Negative for  cold intolerance, heat intolerance, polydipsia, polyphagia and polyuria.  Genitourinary:  Negative for difficulty urinating, dysuria, flank pain, frequency and urgency.  Musculoskeletal:  Negative for arthralgias, back pain, gait problem, joint swelling, myalgias, neck pain and neck stiffness.  Skin:  Negative for color change, pallor, rash and wound.  Neurological:  Negative for dizziness, syncope, speech difficulty, weakness, light-headedness, numbness and headaches.  Hematological:  Does not bruise/bleed easily.  Psychiatric/Behavioral:  Negative for agitation, behavioral problems, confusion, hallucinations, self-injury, sleep disturbance and suicidal ideas. The patient is not nervous/anxious.     Immunization History  Administered Date(s) Administered   DTaP 07/08/1955   Fluad Quad(high Dose 65+) 03/19/2019, 03/23/2020, 05/25/2021   Hep A / Hep B 09/05/2014, 10/10/2014, 03/20/2015   Hepatitis B, adult 09/03/2017, 02/11/2018   Hepatitis B, ped/adol 07/29/2017   Influenza, High Dose Seasonal PF 02/17/2017, 01/22/2018   Influenza,inj,Quad PF,6+ Mos 03/24/2014, 03/17/2015, 12/21/2015   Moderna SARS-COV2 Booster Vaccination 04/24/2020   Moderna Sars-Covid-2 Vaccination 06/25/2019, 07/26/2019   Pneumococcal Conjugate-13 06/24/2014   Pneumococcal Polysaccharide-23 07/07/2009, 08/03/2012   Td 07/08/1951   Tdap 05/17/2013, 03/06/2018   Varicella 07/07/1957   Zoster, Live 07/07/2004   Pertinent  Health Maintenance Due  Topic Date Due   INFLUENZA VACCINE  11/13/2021      04/24/2020    3:36 PM 11/22/2020    8:40 AM 05/15/2021    9:40 AM 05/24/2021    4:16 PM 11/22/2021   10:00 AM  Fall Risk  Falls in the past year? 0 0 0 0 0  Was there an injury with Fall? 0 0 0 0 0  Fall Risk Category Calculator 0 0 0 0 0  Fall Risk Category _0   Patient Fall Risk Level _1   Patient at Risk for Falls Due to  No Fall Risks  No Fall Risks No Fall Risks No Fall Risks  Fall risk Follow up  Falls evaluation completed Falls evaluation completed Falls evaluation completed Falls evaluation completed   Functional Status Survey:    Vitals:   11/22/21 0954  BP: 110/80  Pulse: 73  Resp: 16  Temp: (!) 97.1 F (36.2 C)  SpO2: 94%  Weight: 131 lb 6.4 oz (59.6 kg)  Height: 5' 5" (1.651 m)   Body mass index is 21.87 kg/m. Physical Exam Vitals reviewed.  Constitutional:      General: He is not in acute distress.    Appearance: Normal appearance. He is normal weight. He is not ill-appearing or diaphoretic.  HENT:     Head: Normocephalic.     Right Ear: Tympanic membrane, ear canal and external ear normal. There is no impacted cerumen.     Left Ear: Tympanic membrane, ear canal and external ear normal. There is no impacted cerumen.  Nose: Nose normal. No congestion or rhinorrhea.     Mouth/Throat:     Mouth: Mucous membranes are moist.     Pharynx: Oropharynx is clear. No oropharyngeal exudate or posterior oropharyngeal erythema.  Eyes:     General: No scleral icterus.       Right eye: No discharge.        Left eye: No discharge.     Extraocular Movements: Extraocular movements intact.     Conjunctiva/sclera: Conjunctivae normal.     Pupils: Pupils are equal, round, and reactive to light.  Neck:     Vascular: No carotid bruit.  Cardiovascular:     Rate and Rhythm: Normal rate and regular rhythm.     Pulses: Normal pulses.     Heart sounds: Normal heart sounds. No murmur heard.    No friction rub. No gallop.  Pulmonary:     Effort: Pulmonary effort is normal. No respiratory distress.     Breath sounds: Normal breath sounds. No wheezing, rhonchi or rales.  Chest:     Chest wall: No tenderness.  Abdominal:     General: Bowel sounds are normal. There is no distension.     Palpations: Abdomen is soft. There is no mass.     Tenderness: There is no abdominal tenderness. There is no right CVA tenderness,  left CVA tenderness, guarding or rebound.  Musculoskeletal:        General: No swelling or tenderness. Normal range of motion.     Cervical back: Normal range of motion. No rigidity or tenderness.     Right lower leg: No edema.     Left lower leg: No edema.  Lymphadenopathy:     Cervical: No cervical adenopathy.  Skin:    General: Skin is warm and dry.     Coloration: Skin is not pale.     Findings: No bruising, erythema, lesion or rash.  Neurological:     Mental Status: He is alert and oriented to person, place, and time.     Cranial Nerves: No cranial nerve deficit.     Sensory: No sensory deficit.     Motor: No weakness.     Coordination: Coordination normal.     Gait: Gait normal.  Psychiatric:        Mood and Affect: Mood normal.        Speech: Speech normal.        Behavior: Behavior normal.        Thought Content: Thought content normal.        Judgment: Judgment normal.    Labs reviewed: Recent Labs    05/25/21 0930  NA 138  K 4.4  CL 105  CO2 26  GLUCOSE 88  BUN 8  CREATININE 0.90  CALCIUM 9.4   Recent Labs    05/25/21 0930  AST 19  ALT 12  BILITOT 1.1  PROT 7.4   Recent Labs    05/25/21 0930  WBC 4.7  NEUTROABS 2,270  HGB 13.8  HCT 39.9  MCV 99.5  PLT 201   Lab Results  Component Value Date   TSH 1.04 09/03/2018   Lab Results  Component Value Date   HGBA1C 4.6 07/27/2020   Lab Results  Component Value Date   CHOL 142 05/25/2021   HDL 62 05/25/2021   LDLCALC 64 05/25/2021   TRIG 82 05/25/2021   CHOLHDL 2.3 05/25/2021    Significant Diagnostic Results in last 30 days:  No results found.  Assessment/Plan Problem List  Items Addressed This Visit       Cardiovascular and Mediastinum   Coronary artery disease involving native coronary artery of native heart without angina pectoris    - Chest pain-free -Dietary modification and exercise advised      Relevant Orders   Lipid Panel     Respiratory   Centrilobular emphysema  (HCC) - Primary    Breathing stable -Smoking cessation advised      Relevant Orders   CBC with Differential/Platelet   CMP with eGFR([facility])     Digestive   Gastroesophageal reflux disease    Symptoms controlled -Continue on omeprazole      Relevant Orders   CBC with Differential/Platelet   CMP with eGFR([facility])     Other   Tobacco use disorder, continuous    - Smokes 1 pack of cigarette 3 days -Smoking cessation advised not ready to Quit smoking       Mass of hand, left    Soft multiple mass with different sizes along radial wrist area.  Nontender to palpate without any signs of erythema. -Referral to general surgery for evaluation      Relevant Orders   Ambulatory referral to General Surgery    Family/ staff Communication: Reviewed plan of care with patient as understanding   Labs/tests ordered:  - CBC with Differential/Platelet - CMP with eGFR([facility]) - Lipid panel  Next Appointment : Return in about 6 months (around 05/25/2022) for medical mangement of chronic issues.Sandrea Hughs, NP

## 2021-11-22 NOTE — Assessment & Plan Note (Signed)
-   Chest pain-free -Dietary modification and exercise advised

## 2021-11-22 NOTE — Assessment & Plan Note (Signed)
-   Smokes 1 pack of cigarette 3 days -Smoking cessation advised not ready to Quit smoking

## 2021-11-22 NOTE — Assessment & Plan Note (Signed)
Symptoms controlled -Continue on omeprazole

## 2021-11-22 NOTE — Assessment & Plan Note (Signed)
Breathing stable -Smoking cessation advised

## 2021-11-23 ENCOUNTER — Other Ambulatory Visit: Payer: Self-pay

## 2021-11-23 LAB — CBC WITH DIFFERENTIAL/PLATELET
Absolute Monocytes: 366 cells/uL (ref 200–950)
Basophils Absolute: 48 cells/uL (ref 0–200)
Basophils Relative: 1.3 %
Eosinophils Absolute: 89 cells/uL (ref 15–500)
Eosinophils Relative: 2.4 %
HCT: 36.3 % — ABNORMAL LOW (ref 38.5–50.0)
Hemoglobin: 12.7 g/dL — ABNORMAL LOW (ref 13.2–17.1)
Lymphs Abs: 1458 cells/uL (ref 850–3900)
MCH: 33.2 pg — ABNORMAL HIGH (ref 27.0–33.0)
MCHC: 35 g/dL (ref 32.0–36.0)
MCV: 94.8 fL (ref 80.0–100.0)
MPV: 10.2 fL (ref 7.5–12.5)
Monocytes Relative: 9.9 %
Neutro Abs: 1739 cells/uL (ref 1500–7800)
Neutrophils Relative %: 47 %
Platelets: 156 10*3/uL (ref 140–400)
RBC: 3.83 10*6/uL — ABNORMAL LOW (ref 4.20–5.80)
RDW: 12.2 % (ref 11.0–15.0)
Total Lymphocyte: 39.4 %
WBC: 3.7 10*3/uL — ABNORMAL LOW (ref 3.8–10.8)

## 2021-11-23 LAB — COMPLETE METABOLIC PANEL WITH GFR
AG Ratio: 1.3 (calc) (ref 1.0–2.5)
ALT: 13 U/L (ref 9–46)
AST: 18 U/L (ref 10–35)
Albumin: 4.1 g/dL (ref 3.6–5.1)
Alkaline phosphatase (APISO): 60 U/L (ref 35–144)
BUN: 7 mg/dL (ref 7–25)
CO2: 21 mmol/L (ref 20–32)
Calcium: 9.3 mg/dL (ref 8.6–10.3)
Chloride: 105 mmol/L (ref 98–110)
Creat: 0.89 mg/dL (ref 0.70–1.28)
Globulin: 3.1 g/dL (calc) (ref 1.9–3.7)
Glucose, Bld: 90 mg/dL (ref 65–99)
Potassium: 4.2 mmol/L (ref 3.5–5.3)
Sodium: 135 mmol/L (ref 135–146)
Total Bilirubin: 0.9 mg/dL (ref 0.2–1.2)
Total Protein: 7.2 g/dL (ref 6.1–8.1)
eGFR: 88 mL/min/{1.73_m2} (ref >=60)

## 2021-11-23 LAB — LIPID PANEL
Cholesterol: 140 mg/dL (ref <200)
HDL: 70 mg/dL (ref >=40)
LDL Cholesterol (Calc): 57 mg/dL (calc)
Non-HDL Cholesterol (Calc): 70 mg/dL (calc) (ref <130)
Total CHOL/HDL Ratio: 2 (calc) (ref <5.0)
Triglycerides: 44 mg/dL (ref <150)

## 2022-01-18 ENCOUNTER — Other Ambulatory Visit: Payer: Self-pay | Admitting: Family

## 2022-01-18 DIAGNOSIS — R634 Abnormal weight loss: Secondary | ICD-10-CM

## 2022-01-18 DIAGNOSIS — R630 Anorexia: Secondary | ICD-10-CM

## 2022-01-21 ENCOUNTER — Other Ambulatory Visit: Payer: Self-pay

## 2022-01-21 DIAGNOSIS — R630 Anorexia: Secondary | ICD-10-CM

## 2022-01-21 DIAGNOSIS — R1084 Generalized abdominal pain: Secondary | ICD-10-CM

## 2022-01-21 DIAGNOSIS — K219 Gastro-esophageal reflux disease without esophagitis: Secondary | ICD-10-CM

## 2022-01-21 DIAGNOSIS — R634 Abnormal weight loss: Secondary | ICD-10-CM

## 2022-01-21 MED ORDER — OMEPRAZOLE 20 MG PO CPDR: DELAYED_RELEASE_CAPSULE | ORAL | 1 refills | Status: DC

## 2022-01-21 MED ORDER — MIRTAZAPINE 15 MG PO TABS: ORAL_TABLET | ORAL | 1 refills | Status: DC

## 2022-03-31 ENCOUNTER — Other Ambulatory Visit: Payer: Self-pay | Admitting: Family

## 2022-03-31 DIAGNOSIS — R1084 Generalized abdominal pain: Secondary | ICD-10-CM

## 2022-03-31 DIAGNOSIS — K219 Gastro-esophageal reflux disease without esophagitis: Secondary | ICD-10-CM

## 2022-05-16 ENCOUNTER — Encounter: Payer: Medicare Other | Admitting: Family

## 2022-05-16 ENCOUNTER — Encounter: Payer: Self-pay | Admitting: Family

## 2022-05-16 NOTE — Progress Notes (Signed)
This encounter was created in error - please disregard. Called patient several times but did not answer House phone and mobile phone.

## 2022-05-27 ENCOUNTER — Encounter: Payer: Self-pay | Admitting: Family

## 2022-05-27 ENCOUNTER — Ambulatory Visit (INDEPENDENT_AMBULATORY_CARE_PROVIDER_SITE_OTHER): Payer: Medicare Other | Admitting: Family

## 2022-05-27 VITALS — BP 130/80 | HR 81 | Temp 98.0°F | Resp 18 | Ht 65.0 in | Wt 132.0 lb

## 2022-05-27 DIAGNOSIS — I2510 Atherosclerotic heart disease of native coronary artery without angina pectoris: Secondary | ICD-10-CM | POA: Diagnosis not present

## 2022-05-27 DIAGNOSIS — D509 Iron deficiency anemia, unspecified: Secondary | ICD-10-CM | POA: Diagnosis not present

## 2022-05-27 DIAGNOSIS — J432 Centrilobular emphysema: Secondary | ICD-10-CM

## 2022-05-27 DIAGNOSIS — F17209 Nicotine dependence, unspecified, with unspecified nicotine-induced disorders: Secondary | ICD-10-CM

## 2022-05-27 DIAGNOSIS — R143 Flatulence: Secondary | ICD-10-CM | POA: Diagnosis not present

## 2022-05-27 MED ORDER — SIMETHICONE 80 MG PO CHEW: 80.0000 mg | CHEWABLE_TABLET | Freq: Four times a day (QID) | ORAL | 0 refills | Status: DC | PRN

## 2022-05-27 NOTE — Progress Notes (Signed)
Provider: Marlowe Sax FNP-C   Zykia Walla, Nelda Bucks, NP  Patient Care Team: Chrystopher Stangl, Nelda Bucks, NP as PCP - General (Family Medicine) Gayland Curry, DO (Geriatric Medicine)  Extended Emergency Contact Information Primary Emergency Contact: [name],[name] Address: [address]          [place] [address] [place] Phone: [phone] Relation: Spouse  Code Status:  Full Code Goals of care: Advanced Directive information    05/27/2022   10:42 AM  Advanced Directives  Does Patient Have a Medical Advance Directive? No  Would patient like information on creating a medical advance directive? No - Patient declined     Chief Complaint  Patient presents with   Medical Management of Chronic Issues    Patient is here for a 15M F/U   Immunizations    Patient due for updated covid and flu vaccines    HPI:  Pt is a 77 y.o. male seen today for 6 months follow up for  medical management of chronic diseases.   He states here to get his PNA vaccine though he is up to date. Due for Influenza vaccine but declined.  Patient very uncooperative answering question this visit Kept saying " Give me the Pneumonia vaccine so I can go " Due for fasting labs.states came fasting    Past Medical History:  Diagnosis Date   Allergy    Chronic hepatitis C without mention of hepatic coma    GERD (gastroesophageal reflux disease)    H. pylori infection    Hearing loss of aging    Internal hemorrhoids without mention of complication    Lipoma of unspecified site    Loss of weight    Mild cognitive impairment, so stated    Osteoarthrosis, unspecified whether generalized or localized, unspecified site    Pain in joint, forearm    Personal history of colonic adenoma 06/25/2012   Rash and other nonspecific skin eruption    Routine general medical examination at a health care facility    Substance abuse ([place])    Tinnitus of both ears    Tobacco use disorder    Unspecified visual  loss    Past Surgical History:  Procedure Laterality Date   COLONOSCOPY     left shoulder  1990   POLYPECTOMY      No Known Allergies  Allergies as of 05/27/2022   No Known Allergies      Medication List        Accurate as of May 27, 2022 11:31 AM. If you have any questions, ask your nurse or doctor.          FOLIC ACID PO Take 1 tablet by mouth daily.   mirtazapine 15 MG tablet Commonly known as: REMERON TAKE 1 TABLET BY MOUTH EVERYDAY AT BEDTIME   MULTIVITAMIN PO Take 1 tablet by mouth daily.   omeprazole 20 MG capsule Commonly known as: PRILOSEC TAKE 1 CAPSULE BY MOUTH TWICE  DAILY BEFORE A MEAL   simethicone 80 MG chewable tablet Commonly known as: Gas-X Chew 1 tablet (80 mg total) by mouth every 6 (six) hours as needed for flatulence.   VITAMIN B-12 PO Take 1 tablet by mouth daily.        Review of Systems  Constitutional:  Negative for appetite change, chills, fatigue, fever and unexpected weight change.  HENT:  Positive for dental problem. Negative for congestion, ear discharge, ear pain, facial swelling, hearing loss, nosebleeds, postnasal drip, rhinorrhea, sinus pressure, sinus  pain, sneezing, sore throat, tinnitus and trouble swallowing.        Dentures   Eyes:  Negative for pain, discharge, redness, itching and visual disturbance.  Respiratory:  Negative for cough, chest tightness, shortness of breath and wheezing.   Cardiovascular:  Negative for chest pain, palpitations and leg swelling.  Gastrointestinal:  Negative for abdominal distention, abdominal pain, blood in stool, constipation, diarrhea, nausea and vomiting.  Endocrine: Negative for cold intolerance, heat intolerance, polydipsia, polyphagia and polyuria.  Genitourinary:  Negative for difficulty urinating, dysuria, flank pain, frequency and urgency.  Musculoskeletal:  Negative for arthralgias, back pain, gait problem, joint swelling, myalgias, neck pain and neck stiffness.  Skin:   Negative for color change, pallor, rash and wound.  Neurological:  Negative for dizziness, syncope, speech difficulty, weakness, light-headedness, numbness and headaches.  Hematological:  Does not bruise/bleed easily.  Psychiatric/Behavioral:  Negative for agitation, behavioral problems, confusion, hallucinations, self-injury, sleep disturbance and suicidal ideas. The patient is not nervous/anxious.     Immunization History  Administered Date(s) Administered   DTaP 07/08/1956   Fluad Quad(high Dose 65+) 03/19/2019, 03/23/2020, 05/25/2021   Hep A / Hep B 09/05/2014, 10/10/2014, 03/20/2015   Hepatitis B, PED/ADOLESCENT 07/29/2017   Hepatitis B, adult 09/03/2017, 02/11/2018   Influenza, High Dose Seasonal PF 02/17/2017, 01/22/2018   Influenza,inj,Quad PF,6+ Mos 03/24/2014, 03/17/2015, 12/21/2015   Moderna SARS-COV2 Booster Vaccination 04/24/2020   Moderna Sars-Covid-2 Vaccination 06/25/2019, 07/26/2019   Pneumococcal Conjugate-13 06/24/2014   Pneumococcal Polysaccharide-23 07/07/2009, 08/03/2012   Td 07/08/1952   Tdap 05/17/2013, 03/06/2018   Varicella 07/07/1957   Zoster, Live 07/07/2004   Pertinent  Health Maintenance Due  Topic Date Due   INFLUENZA VACCINE  11/13/2021      11/22/2020    8:40 AM 05/15/2021    9:40 AM 05/24/2021    4:16 PM 11/22/2021   10:00 AM 05/16/2022   10:15 AM  Fall Risk  Falls in the past year? 0 0 0 0 0  Was there an injury with Fall? 0 0 0 0 0  Fall Risk Category Calculator 0 0 0 0 0  Fall Risk Category (Retired) Low Low Low Low   (RETIRED) Patient Fall Risk Level Low fall risk Low fall risk Low fall risk Low fall risk   Patient at Risk for Falls Due to No Fall Risks No Fall Risks No Fall Risks No Fall Risks No Fall Risks  Fall risk Follow up Falls evaluation completed Falls evaluation completed Falls evaluation completed Falls evaluation completed Falls evaluation completed   Functional Status Survey:    Vitals:   05/27/22 1040  BP: 130/80  Pulse:  81  Resp: 18  Temp: 98 F (36.7 C)  SpO2: 97%  Weight: 132 lb (59.9 kg)  Height: 5' 5"$  (1.651 m)   Body mass index is 21.97 kg/m. Physical Exam Vitals reviewed.  Constitutional:      General: He is not in acute distress.    Appearance: Normal appearance. He is normal weight. He is not ill-appearing or diaphoretic.  HENT:     Head: Normocephalic.     Right Ear: Tympanic membrane, ear canal and external ear normal. There is no impacted cerumen.     Left Ear: Tympanic membrane, ear canal and external ear normal. There is no impacted cerumen.     Nose: Nose normal. No congestion or rhinorrhea.     Mouth/Throat:     Mouth: Mucous membranes are moist.     Dentition: Has dentures.  Pharynx: Oropharynx is clear. No oropharyngeal exudate or posterior oropharyngeal erythema.  Eyes:     General: No scleral icterus.       Right eye: No discharge.        Left eye: No discharge.     Extraocular Movements: Extraocular movements intact.     Conjunctiva/sclera: Conjunctivae normal.     Pupils: Pupils are equal, round, and reactive to light.  Neck:     Vascular: No carotid bruit.  Cardiovascular:     Rate and Rhythm: Normal rate and regular rhythm.     Pulses: Normal pulses.     Heart sounds: Normal heart sounds. No murmur heard.    No friction rub. No gallop.  Pulmonary:     Effort: Pulmonary effort is normal. No respiratory distress.     Breath sounds: Normal breath sounds. No wheezing, rhonchi or rales.  Chest:     Chest wall: No tenderness.  Abdominal:     General: Bowel sounds are normal. There is no distension.     Palpations: Abdomen is soft. There is no mass.     Tenderness: There is no abdominal tenderness. There is no right CVA tenderness, left CVA tenderness, guarding or rebound.  Musculoskeletal:        General: No swelling or tenderness. Normal range of motion.     Cervical back: Normal range of motion. No rigidity or tenderness.     Right lower leg: No edema.      Left lower leg: No edema.  Lymphadenopathy:     Cervical: No cervical adenopathy.  Skin:    General: Skin is warm and dry.     Coloration: Skin is not pale.     Findings: No bruising, erythema, lesion or rash.  Neurological:     Mental Status: He is alert and oriented to person, place, and time.     Cranial Nerves: No cranial nerve deficit.     Sensory: No sensory deficit.     Motor: No weakness.     Coordination: Coordination normal.     Gait: Gait normal.  Psychiatric:        Mood and Affect: Affect is angry.        Speech: Speech normal.        Behavior: Behavior normal.        Thought Content: Thought content normal.        Judgment: Judgment normal.     Labs reviewed: Recent Labs    11/22/21 1052  NA 135  K 4.2  CL 105  CO2 21  GLUCOSE 90  BUN 7  CREATININE 0.89  CALCIUM 9.3   Recent Labs    11/22/21 1052  AST 18  ALT 13  BILITOT 0.9  PROT 7.2   Recent Labs    11/22/21 1052  WBC 3.7*  NEUTROABS 1,739  HGB 12.7*  HCT 36.3*  MCV 94.8  PLT 156   Lab Results  Component Value Date   TSH 1.04 09/03/2018   Lab Results  Component Value Date   HGBA1C 4.6 07/27/2020   Lab Results  Component Value Date   CHOL 140 11/22/2021   HDL 70 11/22/2021   LDLCALC 57 11/22/2021   TRIG 44 11/22/2021   CHOLHDL 2.0 11/22/2021    Significant Diagnostic Results in last 30 days:  No results found.  Assessment/Plan 1. Flatulence Abdomen non-tender ,non-distended with x 4 quadrant BS present.  - simethicone (GAS-X) 80 MG chewable tablet; Chew 1 tablet (80 mg total)  by mouth every 6 (six) hours as needed for flatulence.  Dispense: 30 tablet; Refill: 0  2. Centrilobular emphysema (HCC) Breathing stable  - CBC with Differential/Platelet - COMPLETE METABOLIC PANEL WITH GFR  3. Coronary artery disease involving native coronary artery of native heart without angina pectoris Chest pain free Not on statin or ASA declines during visit  - Lipid Panel  4. Tobacco  use disorder, continuous Smoking cessation advised   5. Iron deficiency anemia, unspecified iron deficiency anemia type Previous Hgb 12.7 denies any symptoms of GI bleed - CBC with Differential/Platelet  Family/ staff Communication: Reviewed plan of care with patient verbalized understanding but angry during visit unclear why thought he was just coming in for a PNA vaccine but he is up to date with PNA vaccine but due for flu shot.   Labs/tests ordered:  - CBC with Differential/Platelet - COMPLETE METABOLIC PANEL WITH GFR - Lipid Panel  Next Appointment : Return in about 6 months (around 11/25/2022) for medical mangement of chronic issues.Sandrea Hughs, NP

## 2022-05-28 LAB — CBC WITH DIFFERENTIAL/PLATELET
Absolute Monocytes: 405 cells/uL (ref 200–950)
Basophils Absolute: 49 cells/uL (ref 0–200)
Basophils Relative: 0.9 %
Eosinophils Absolute: 59 cells/uL (ref 15–500)
Eosinophils Relative: 1.1 %
HCT: 39.6 % (ref 38.5–50.0)
Hemoglobin: 13.5 g/dL (ref 13.2–17.1)
Lymphs Abs: 1696 cells/uL (ref 850–3900)
MCH: 31.6 pg (ref 27.0–33.0)
MCHC: 34.1 g/dL (ref 32.0–36.0)
MCV: 92.7 fL (ref 80.0–100.0)
MPV: 10.3 fL (ref 7.5–12.5)
Monocytes Relative: 7.5 %
Neutro Abs: 3191 cells/uL (ref 1500–7800)
Neutrophils Relative %: 59.1 %
Platelets: 239 10*3/uL (ref 140–400)
RBC: 4.27 10*6/uL (ref 4.20–5.80)
RDW: 12.4 % (ref 11.0–15.0)
Total Lymphocyte: 31.4 %
WBC: 5.4 10*3/uL (ref 3.8–10.8)

## 2022-05-28 LAB — COMPLETE METABOLIC PANEL WITH GFR
AG Ratio: 1.2 (calc) (ref 1.0–2.5)
ALT: 13 U/L (ref 9–46)
AST: 17 U/L (ref 10–35)
Albumin: 4.3 g/dL (ref 3.6–5.1)
Alkaline phosphatase (APISO): 78 U/L (ref 35–144)
BUN: 8 mg/dL (ref 7–25)
CO2: 22 mmol/L (ref 20–32)
Calcium: 9.4 mg/dL (ref 8.6–10.3)
Chloride: 104 mmol/L (ref 98–110)
Creat: 0.9 mg/dL (ref 0.70–1.28)
Globulin: 3.5 g/dL (calc) (ref 1.9–3.7)
Glucose, Bld: 83 mg/dL (ref 65–99)
Potassium: 4.2 mmol/L (ref 3.5–5.3)
Sodium: 137 mmol/L (ref 135–146)
Total Bilirubin: 1.1 mg/dL (ref 0.2–1.2)
Total Protein: 7.8 g/dL (ref 6.1–8.1)
eGFR: 88 mL/min/{1.73_m2} (ref >=60)

## 2022-05-28 LAB — LIPID PANEL
Cholesterol: 115 mg/dL (ref <200)
HDL: 56 mg/dL (ref >=40)
LDL Cholesterol (Calc): 46 mg/dL (calc)
Non-HDL Cholesterol (Calc): 59 mg/dL (calc) (ref <130)
Total CHOL/HDL Ratio: 2.1 (calc) (ref <5.0)
Triglycerides: 44 mg/dL (ref <150)

## 2022-06-10 ENCOUNTER — Encounter: Payer: Self-pay | Admitting: Podiatry

## 2022-06-10 ENCOUNTER — Ambulatory Visit: Payer: Medicare Other | Admitting: Podiatry

## 2022-06-10 VITALS — BP 156/71 | HR 68

## 2022-06-10 DIAGNOSIS — M79674 Pain in right toe(s): Secondary | ICD-10-CM | POA: Diagnosis not present

## 2022-06-10 DIAGNOSIS — B351 Tinea unguium: Secondary | ICD-10-CM | POA: Diagnosis not present

## 2022-06-10 DIAGNOSIS — M79675 Pain in left toe(s): Secondary | ICD-10-CM

## 2022-06-10 NOTE — Progress Notes (Signed)
This patient presents to the office with chief complaint of long thick painful nails.  Patient says the nails are painful walking and wearing shoes.  This patient is unable to self treat.  This patient is unable to trim her nails since she is unable to reach her nails.  She presents to the office for preventative foot care services. Patient has not been seen in about 2 years.  General Appearance  Alert, conversant and in no acute stress.  Vascular  Dorsalis pedis and posterior tibial  pulses are palpable  bilaterally.  Capillary return is within normal limits  bilaterally. Temperature is within normal limits  bilaterally.  Neurologic  Senn-Weinstein monofilament wire test within normal limits  bilaterally. Muscle power within normal limits bilaterally.  Nails Thick disfigured discolored nails with subungual debris  from hallux to fifth toes bilaterally. No evidence of bacterial infection or drainage bilaterally.  Orthopedic  No limitations of motion  feet .  No crepitus or effusions noted.  No bony pathology or digital deformities noted.  Skin  normotropic skin with no porokeratosis noted bilaterally.  No signs of infections or ulcers noted.     Onychomycosis  Nails  B/L.  Pain in right toes  Pain in left toes  Debridement of nails both feet followed trimming the nails with dremel tool.    RTC 3 months.   Gardiner Barefoot DPM

## 2022-09-10 ENCOUNTER — Encounter: Payer: Self-pay | Admitting: Family

## 2022-09-10 ENCOUNTER — Ambulatory Visit (INDEPENDENT_AMBULATORY_CARE_PROVIDER_SITE_OTHER): Payer: Medicare Other | Admitting: Family

## 2022-09-10 VITALS — BP 116/72 | HR 63 | Temp 97.6°F | Resp 20 | Ht 65.0 in | Wt 126.6 lb

## 2022-09-10 DIAGNOSIS — Z0000 Encounter for general adult medical examination without abnormal findings: Secondary | ICD-10-CM

## 2022-09-10 NOTE — Patient Instructions (Signed)
Ryan Ortega , Thank you for taking time to come for your Medicare Wellness Visit. I appreciate your ongoing commitment to your health goals. Please review the following plan we discussed and let me know if I can assist you in the future.   Screening recommendations/referrals: Colonoscopy : N/A  Recommended yearly ophthalmology/optometry visit for glaucoma screening and checkup Recommended yearly dental visit for hygiene and checkup  Vaccinations: Influenza vaccine due annually in September/October Pneumococcal vaccine : Up to date  Tdap vaccine  : Up to date  Shingles vaccine Due please get vaccine at the pharmacy     Advanced directives: No   Conditions/risks identified:  advanced age (>60men, >51 women);male gender;smoking/ tobacco exposure  Next appointment: 1 year   Preventive Care 60 Years and Older, Male Preventive care refers to lifestyle choices and visits with your health care provider that can promote health and wellness. What does preventive care include? A yearly physical exam. This is also called an annual well check. Dental exams once or twice a year. Routine eye exams. Ask your health care provider how often you should have your eyes checked. Personal lifestyle choices, including: Daily care of your teeth and gums. Regular physical activity. Eating a healthy diet. Avoiding tobacco and drug use. Limiting alcohol use. Practicing safe sex. Taking low doses of aspirin every day. Taking vitamin and mineral supplements as recommended by your health care provider. What happens during an annual well check? The services and screenings done by your health care provider during your annual well check will depend on your age, overall health, lifestyle risk factors, and family history of disease. Counseling  Your health care provider may ask you questions about your: Alcohol use. Tobacco use. Drug use. Emotional well-being. Home and relationship well-being. Sexual  activity. Eating habits. History of falls. Memory and ability to understand (cognition). Work and work Astronomer. Screening  You may have the following tests or measurements: Height, weight, and BMI. Blood pressure. Lipid and cholesterol levels. These may be checked every 5 years, or more frequently if you are over 28 years old. Skin check. Lung cancer screening. You may have this screening every year starting at age 28 if you have a 30-pack-year history of smoking and currently smoke or have quit within the past 15 years. Fecal occult blood test (FOBT) of the stool. You may have this test every year starting at age 68. Flexible sigmoidoscopy or colonoscopy. You may have a sigmoidoscopy every 5 years or a colonoscopy every 10 years starting at age 45. Prostate cancer screening. Recommendations will vary depending on your family history and other risks. Hepatitis C blood test. Hepatitis B blood test. Sexually transmitted disease (STD) testing. Diabetes screening. This is done by checking your blood sugar (glucose) after you have not eaten for a while (fasting). You may have this done every 1-3 years. Abdominal aortic aneurysm (AAA) screening. You may need this if you are a current or former smoker. Osteoporosis. You may be screened starting at age 87 if you are at high risk. Talk with your health care provider about your test results, treatment options, and if necessary, the need for more tests. Vaccines  Your health care provider may recommend certain vaccines, such as: Influenza vaccine. This is recommended every year. Tetanus, diphtheria, and acellular pertussis (Tdap, Td) vaccine. You may need a Td booster every 10 years. Zoster vaccine. You may need this after age 26. Pneumococcal 13-valent conjugate (PCV13) vaccine. One dose is recommended after age 63. Pneumococcal polysaccharide (PPSV23)  vaccine. One dose is recommended after age 28. Talk to your health care provider about which  screenings and vaccines you need and how often you need them. This information is not intended to replace advice given to you by your health care provider. Make sure you discuss any questions you have with your health care provider. Document Released: 04/28/2015 Document Revised: 12/20/2015 Document Reviewed: 01/31/2015 Elsevier Interactive Patient Education  2017 [employer].  Fall Prevention in the Home Falls can cause injuries. They can happen to people of all ages. There are many things you can do to make your home safe and to help prevent falls. What can I do on the outside of my home? Regularly fix the edges of walkways and driveways and fix any cracks. Remove anything that might make you trip as you walk through a door, such as a raised step or threshold. Trim any bushes or trees on the path to your home. Use bright outdoor lighting. Clear any walking paths of anything that might make someone trip, such as rocks or tools. Regularly check to see if handrails are loose or broken. Make sure that both sides of any steps have handrails. Any raised decks and porches should have guardrails on the edges. Have any leaves, snow, or ice cleared regularly. Use sand or salt on walking paths during winter. Clean up any spills in your garage right away. This includes oil or grease spills. What can I do in the bathroom? Use night lights. Install grab bars by the toilet and in the tub and shower. Do not use towel bars as grab bars. Use non-skid mats or decals in the tub or shower. If you need to sit down in the shower, use a plastic, non-slip stool. Keep the floor dry. Clean up any water that spills on the floor as soon as it happens. Remove soap buildup in the tub or shower regularly. Attach bath mats securely with double-sided non-slip rug tape. Do not have throw rugs and other things on the floor that can make you trip. What can I do in the bedroom? Use night lights. Make sure that you have a  light by your bed that is easy to reach. Do not use any sheets or blankets that are too big for your bed. They should not hang down onto the floor. Have a firm chair that has side arms. You can use this for support while you get dressed. Do not have throw rugs and other things on the floor that can make you trip. What can I do in the kitchen? Clean up any spills right away. Avoid walking on wet floors. Keep items that you use a lot in easy-to-reach places. If you need to reach something above you, use a strong step stool that has a grab bar. Keep electrical cords out of the way. Do not use floor polish or wax that makes floors slippery. If you must use wax, use non-skid floor wax. Do not have throw rugs and other things on the floor that can make you trip. What can I do with my stairs? Do not leave any items on the stairs. Make sure that there are handrails on both sides of the stairs and use them. Fix handrails that are broken or loose. Make sure that handrails are as long as the stairways. Check any carpeting to make sure that it is firmly attached to the stairs. Fix any carpet that is loose or worn. Avoid having throw rugs at the top or bottom  of the stairs. If you do have throw rugs, attach them to the floor with carpet tape. Make sure that you have a light switch at the top of the stairs and the bottom of the stairs. If you do not have them, ask someone to add them for you. What else can I do to help prevent falls? Wear shoes that: Do not have high heels. Have rubber bottoms. Are comfortable and fit you well. Are closed at the toe. Do not wear sandals. If you use a stepladder: Make sure that it is fully opened. Do not climb a closed stepladder. Make sure that both sides of the stepladder are locked into place. Ask someone to hold it for you, if possible. Clearly mark and make sure that you can see: Any grab bars or handrails. First and last steps. Where the edge of each step  is. Use tools that help you move around (mobility aids) if they are needed. These include: Canes. Walkers. Scooters. Crutches. Turn on the lights when you go into a dark area. Replace any light bulbs as soon as they burn out. Set up your furniture so you have a clear path. Avoid moving your furniture around. If any of your floors are uneven, fix them. If there are any pets around you, be aware of where they are. Review your medicines with your doctor. Some medicines can make you feel dizzy. This can increase your chance of falling. Ask your doctor what other things that you can do to help prevent falls. This information is not intended to replace advice given to you by your health care provider. Make sure you discuss any questions you have with your health care provider. Document Released: 01/26/2009 Document Revised: 09/07/2015 Document Reviewed: 05/06/2014 Elsevier Interactive Patient Education  2017 [employer].

## 2022-09-10 NOTE — Progress Notes (Signed)
Subjective:   Ryan Ortega is a 77 y.o. male who presents for Medicare Annual/Subsequent preventive examination.  Review of Systems     Cardiac Risk Factors include: advanced age (>101men, >23 women);male gender;smoking/ tobacco exposure     Objective:    Today's Vitals   09/10/22 1038  BP: 116/72  Pulse: 63  Resp: 20  Temp: 97.6 F (36.4 C)  SpO2: 98%  Weight: 126 lb 9.6 oz (57.4 kg)  Height: 5\' 5"  (1.651 m)   Body mass index is 21.07 kg/m.     09/10/2022   10:38 AM 05/27/2022   10:42 AM 05/16/2022   10:18 AM 11/22/2021   10:00 AM 05/24/2021    4:16 PM 05/15/2021    9:44 AM 11/22/2020    8:41 AM  Advanced Directives  Does Patient Have a Medical Advance Directive? No No No No No No No  Would patient like information on creating a medical advance directive? No - Patient declined No - Patient declined No - Patient declined No - Patient declined No - Patient declined No - Patient declined No - Patient declined    Current Medications (verified) Outpatient Encounter Medications as of 09/10/2022  Medication Sig   Cyanocobalamin (VITAMIN B-12 PO) Take 1 tablet by mouth daily.   FOLIC ACID PO Take 1 tablet by mouth daily.   mirtazapine (REMERON) 15 MG tablet TAKE 1 TABLET BY MOUTH EVERYDAY AT BEDTIME   Multiple Vitamin (MULTIVITAMIN PO) Take 1 tablet by mouth daily.   omeprazole (PRILOSEC) 20 MG capsule TAKE 1 CAPSULE BY MOUTH TWICE  DAILY BEFORE A MEAL   simethicone (GAS-X) 80 MG chewable tablet Chew 1 tablet (80 mg total) by mouth every 6 (six) hours as needed for flatulence.   Facility-Administered Encounter Medications as of 09/10/2022  Medication   0.9 %  sodium chloride infusion    Allergies (verified) Patient has no known allergies.   History: Past Medical History:  Diagnosis Date   Allergy    Chronic hepatitis C without mention of hepatic coma    GERD (gastroesophageal reflux disease)    H. pylori infection    Hearing loss of aging    Internal hemorrhoids  without mention of complication    Lipoma of unspecified site    Loss of weight    Mild cognitive impairment, so stated    Osteoarthrosis, unspecified whether generalized or localized, unspecified site    Pain in joint, forearm    Personal history of colonic adenoma 06/25/2012   Rash and other nonspecific skin eruption    Routine general medical examination at a health care facility    Substance abuse (HCC)    Tinnitus of both ears    Tobacco use disorder    Unspecified visual loss    Past Surgical History:  Procedure Laterality Date   COLONOSCOPY     left shoulder  1990   POLYPECTOMY     Family History  Problem Relation Age of Onset   Cancer Mother    Stroke Sister    Breast cancer Sister    Colitis Neg Hx    Esophageal cancer Neg Hx    Stomach cancer Neg Hx    Rectal cancer Neg Hx    Colon cancer Neg Hx    Social History   Socioeconomic History   Marital status: Married    Spouse name: Not on file   Number of children: 4   Years of education: Not on file   Highest education level: Not  on file  Occupational History   Occupation: retired    Associate Professor: Lawyer  Tobacco Use   Smoking status: Some Days    Packs/day: 0.25    Years: 40.00    Additional pack years: 0.00    Total pack years: 10.00    Types: Cigarettes   Smokeless tobacco: Never  Vaping Use   Vaping Use: Never used  Substance and Sexual Activity   Alcohol use: Yes    Alcohol/week: 8.0 standard drinks of alcohol    Types: 8 Cans of beer per week    Comment: weekly.   Drug use: Yes    Frequency: 5.0 times per week    Types: Marijuana    Comment: smokes marijuna "every now and then during the week"   Sexual activity: Yes  Other Topics Concern   Not on file  Social History Narrative   He is married with 4 children   He is retired from concrete work   Drinks about 18 cans of beer a week   Admits to using some marijuana   He is an intermittent smoker   04/07/2017   Social Determinants  of Health   Financial Resource Strain: Low Risk  (04/14/2017)   Overall Financial Resource Strain (CARDIA)    Difficulty of Paying Living Expenses: Not hard at all  Food Insecurity: No Food Insecurity (04/14/2017)   Hunger Vital Sign    Worried About Running Out of Food in the Last Year: Never true    Ran Out of Food in the Last Year: Never true  Transportation Needs: No Transportation Needs (04/14/2017)   PRAPARE - Administrator, Civil Service (Medical): No    Lack of Transportation (Non-Medical): No  Physical Activity: Inactive (04/14/2017)   Exercise Vital Sign    Days of Exercise per Week: 0 days    Minutes of Exercise per Session: 0 min  Stress: No Stress Concern Present (04/14/2017)   [employer] of Occupational Health - Occupational Stress Questionnaire    Feeling of Stress : Not at all  Social Connections: Somewhat Isolated (04/14/2017)   Social Connection and Isolation Panel [NHANES]    Frequency of Communication with Friends and Family: More than three times a week    Frequency of Social Gatherings with Friends and Family: More than three times a week    Attends Religious Services: Never    Database administrator or Organizations: No    Attends Engineer, structural: Never    Marital Status: Married    Tobacco Counseling Ready to quit: Not Answered Counseling given: Not Answered   Clinical Intake:  Pre-visit preparation completed: No  Pain : No/denies pain     BMI - recorded: 21.07 Nutritional Status: BMI of 19-24  Normal Nutritional Risks: None Diabetes: No  How often do you need to have someone help you when you read instructions, pamphlets, or other written materials from your doctor or pharmacy?: 1 - Never What is the last grade level you completed in school?: 7 th grade  Diabetic?No   Interpreter Needed?: No  Information entered by :: Porsha McClurkin,CMA   Activities of Daily Living    09/10/2022   11:10 AM  09/10/2022   10:39 AM  In your present state of health, do you have any difficulty performing the following activities:  Hearing? 0 0  Vision? 0 0  Difficulty concentrating or making decisions? 0 0  Walking or climbing stairs? 0 0  Dressing or bathing? 0  0  Doing errands, shopping? 0 0  Preparing Food and eating ? N N  Using the Toilet? N N  In the past six months, have you accidently leaked urine? N N  Do you have problems with loss of bowel control? N N  Managing your Medications? N N  Managing your Finances? N N  Housekeeping or managing your Housekeeping? N N  Comment son and wife assist     Patient Care Team: Shanequia Kendrick, Donalee Citrin, NP as PCP - General (Family Medicine) Kermit Balo, DO (Geriatric Medicine)  Indicate any recent Medical Services you may have received from other than [facility] providers in the past year (date may be approximate).     Assessment:   This is a routine wellness examination for Leib.  Hearing/Vision screen No results found.  Dietary issues and exercise activities discussed: Current Exercise Habits: Home exercise routine, Type of exercise: walking, Time (Minutes): 15, Frequency (Times/Week): 2, Weekly Exercise (Minutes/Week): 30, Intensity: Mild, Exercise limited by: None identified   Goals Addressed             This Visit's Progress    DIET - INCREASE WATER INTAKE   Not on track    Patient will increase water to 3-4 botles a day      Quit Smoking   Not on track    Would like to cut down to 1 pack of cigarettes to last 2 weeks.        Depression Screen    09/10/2022   10:38 AM 05/16/2022   10:15 AM 05/25/2021    8:54 AM 05/15/2021    9:40 AM 04/24/2020    3:36 PM 03/23/2020   10:24 AM 07/23/2019    9:02 AM  PHQ 2/9 Scores  PHQ - 2 Score 0 0 0 0 0 0 0    Fall Risk    09/10/2022   10:38 AM 05/16/2022   10:15 AM 11/22/2021   10:00 AM 05/24/2021    4:16 PM 05/15/2021    9:40 AM  Fall Risk   Falls in the past year? 0 0 0 0 0  Number falls  in past yr: 0 0 0 0 0  Injury with Fall? 0 0 0 0 0  Risk for fall due to : No Fall Risks No Fall Risks No Fall Risks No Fall Risks No Fall Risks  Follow up Falls evaluation completed Falls evaluation completed Falls evaluation completed Falls evaluation completed Falls evaluation completed    FALL RISK PREVENTION PERTAINING TO THE HOME:  Any stairs in or around the home? No  If so, are there any without handrails? No  Home free of loose throw rugs in walkways, pet beds, electrical cords, etc? No  Adequate lighting in your home to reduce risk of falls? Yes   ASSISTIVE DEVICES UTILIZED TO PREVENT FALLS:  Life alert? No  Use of a cane, walker or w/c? No  Grab bars in the bathroom? No  Shower chair or bench in shower? No  Elevated toilet seat or a handicapped toilet? Yes   TIMED UP AND GO:  Was the test performed? Yes .  Length of time to ambulate 10 feet: 12 sec.   Gait slow and steady without use of assistive device  Cognitive Function:    09/10/2022   10:43 AM 04/17/2018   11:31 AM 04/17/2018   11:22 AM 04/14/2017    8:39 AM 04/12/2016    8:46 AM  MMSE - Mini Mental State Exam  Orientation  to time 5 4 4 5 5   Orientation to Place 5 4 4 4 4   Registration 3 3 3 3 3   Attention/ Calculation 0 0  0 0  Attention/Calculation-comments  patient can not read     Recall 3 1 1  0 3  Language- name 2 objects 2 2 2 2 2   Language- repeat 1 0 0 1 1  Language- follow 3 step command 3 3 3 2 2   Language- read & follow direction 0 0  0 0  Language-read & follow direction-comments  patient can not read or write     Write a sentence 0 0  0 0  Write a sentence-comments  patient can not read or write     Copy design 0 1 1 0 0  Total score 22 18  17 20         05/16/2022   10:16 AM 05/15/2021    9:41 AM 04/24/2020    3:40 PM  6CIT Screen  What Year? 0 points 0 points 0 points  What month? 0 points 0 points 0 points  What time? 0 points 0 points 0 points  Count back from 20 4 points 4 points  4 points  Months in reverse 4 points 4 points 4 points  Repeat phrase 10 points 10 points 10 points  Total Score 18 points 18 points 18 points    Immunizations Immunization History  Administered Date(s) Administered   DTaP 07[birth year]08/1956   Fluad Quad(high Dose 65+) 03/19/2019, 03/23/2020, 05/25/2021   Hep A / Hep B 09/05/2014, 10/10/2014, 03/20/2015   Hepatitis B, ADULT 09/03/2017, 02/11/2018   Hepatitis B, PED/ADOLESCENT 07/29/2017   Influenza, High Dose Seasonal PF 02/17/2017, 01/22/2018   Influenza,inj,Quad PF,6+ Mos 03/24/2014, 03/17/2015, 12/21/2015   Moderna SARS-COV2 Booster Vaccination 04/24/2020   Moderna Sars-Covid-2 Vaccination 06/25/2019, 07/26/2019   Pneumococcal Conjugate-13 06/24/2014   Pneumococcal Polysaccharide-23 07/07/2009, 08/03/2012   Td 07/08/1951   Tdap 05/17/2013, 03/06/2018   Varicella 07/07/1957   Zoster, Live 07/07/2004    TDAP status: Up to date  Flu Vaccine status: Up to date  Pneumococcal vaccine status: Completed during today's visit.  Covid-19 vaccine status: Information provided on how to obtain vaccines.   Qualifies for Shingles Vaccine? Yes   Zostavax completed No   Shingrix Completed?: No.    Education has been provided regarding the importance of this vaccine. Patient has been advised to call insurance company to determine out of pocket expense if they have not yet received this vaccine. Advised may also receive vaccine at local pharmacy or Health Dept. Verbalized acceptance and understanding.  Screening Tests Health Maintenance  Topic Date Due   COVID-19 Vaccine (4 - 2023-24 season) 12/14/2021   INFLUENZA VACCINE  11/14/2022   Medicare Annual Wellness (AWV)  09/10/2023   DTaP/Tdap/Td (5 - Td or Tdap) 03/06/2028   Pneumonia Vaccine 12+ Years old  Completed   Hepatitis C Screening  Completed   HPV VACCINES  Aged Out   Zoster Vaccines- Shingrix  Discontinued    Health Maintenance  Health Maintenance Due  Topic Date Due   COVID-19  Vaccine (4 - 2023-24 season) 12/14/2021    Colorectal cancer screening: No longer required.   Lung Cancer Screening: (Low Dose CT Chest recommended if Age 76-80 years, 30 pack-year currently smoking OR have quit w/in 15years.) does qualify.   Lung Cancer Screening Referral:done 06/22/2019   Additional Screening:  Hepatitis C Screening: does qualify; Completed Yes    Vision Screening: Recommended annual ophthalmology exams for  early detection of glaucoma and other disorders of the eye. Is the patient up to date with their annual eye exam?  No  Who is the provider or what is the name of the office in which the patient attends annual eye exams? None for one year  If pt is not established with a provider, would they like to be referred to a provider to establish care? Yes .   Dental Screening: Recommended annual dental exams for proper oral hygiene  Community Resource Referral / Chronic Care Management: CRR required this visit?  No   CCM required this visit?  No      Plan:     I have personally reviewed and noted the following in the patient's chart:   Medical and social history Use of alcohol, tobacco or illicit drugs  Current medications and supplements including opioid prescriptions. Patient is not currently taking opioid prescriptions. Functional ability and status Nutritional status Physical activity Advanced directives List of other physicians Hospitalizations, surgeries, and ER visits in previous 12 months Vitals Screenings to include cognitive, depression, and falls Referrals and appointments  In addition, I have reviewed and discussed with patient certain preventive protocols, quality metrics, and best practice recommendations. A written personalized care plan for preventive services as well as general preventive health recommendations were provided to patient.     Caesar Bookman, NP   09/10/2022   Nurse Notes: Recommend schedule for annual eye exam with  Ophthalmologist  - Also advised to get shingles and COVID-19 at the pharmacy  - Scored 22/30 on MMSE though limited due to inability to read or write sentence.

## 2022-11-27 ENCOUNTER — Ambulatory Visit (INDEPENDENT_AMBULATORY_CARE_PROVIDER_SITE_OTHER): Payer: Medicare Other | Admitting: Family

## 2022-11-27 ENCOUNTER — Encounter: Payer: Self-pay | Admitting: Family

## 2022-11-27 VITALS — BP 122/88 | HR 60 | Temp 97.3°F | Resp 18 | Ht 65.0 in | Wt 126.4 lb

## 2022-11-27 DIAGNOSIS — J432 Centrilobular emphysema: Secondary | ICD-10-CM | POA: Diagnosis not present

## 2022-11-27 DIAGNOSIS — F17209 Nicotine dependence, unspecified, with unspecified nicotine-induced disorders: Secondary | ICD-10-CM

## 2022-11-27 DIAGNOSIS — D509 Iron deficiency anemia, unspecified: Secondary | ICD-10-CM | POA: Diagnosis not present

## 2022-11-27 DIAGNOSIS — I2510 Atherosclerotic heart disease of native coronary artery without angina pectoris: Secondary | ICD-10-CM

## 2022-11-27 DIAGNOSIS — G992 Myelopathy in diseases classified elsewhere: Secondary | ICD-10-CM

## 2022-11-27 DIAGNOSIS — M4802 Spinal stenosis, cervical region: Secondary | ICD-10-CM

## 2022-11-27 DIAGNOSIS — K219 Gastro-esophageal reflux disease without esophagitis: Secondary | ICD-10-CM

## 2022-11-27 DIAGNOSIS — E039 Hypothyroidism, unspecified: Secondary | ICD-10-CM | POA: Diagnosis not present

## 2022-11-27 DIAGNOSIS — B182 Chronic viral hepatitis C: Secondary | ICD-10-CM

## 2022-11-27 NOTE — Progress Notes (Signed)
Provider: Richarda Blade FNP-C   Eugen Jeansonne, Donalee Citrin, NP  Patient Care Team: Dustina Scoggin, Donalee Citrin, NP as PCP - General (Family Medicine) Kermit Balo, DO (Geriatric Medicine)  Extended Emergency Contact Information Primary Emergency Contact: [name],[name] Address: [address]          [place] [address] [place] Home Phone: [phone] Relation: Spouse  Code Status:  Full Code  Goals of care: Advanced Directive information    11/27/2022    8:54 AM  Advanced Directives  Does Patient Have a Medical Advance Directive? No  Would patient like information on creating a medical advance directive? No - Patient declined     Chief Complaint  Patient presents with   Follow-up    6 month f/u    Quality Metric Gaps    Needs Covid and Influenza vaccine.    HPI:  Pt is a 77 y.o. male seen today for 6 months follow up for medical management of chronic diseases. Here with the wife.He denies any acute issues though wife states sometimes whenever he gets up he tends to loss balance.patient states tends to get up too quick and walks fast.He denies any headache,dizziness,vision changes,fatigue,chest tightness,palpitation,chest pain or shortness of breath.    He continues to smoke more than a pack per day.smoking cessation discussed.Nicotine patch and Wellbutrin discussed but declines.Not ready to quit.   Emphysema - denies any worsening cough ,wheezing or shortness of breath.   CAD - denies any chest pain.   Past Medical History:  Diagnosis Date   Allergy    Chronic hepatitis C without mention of hepatic coma    GERD (gastroesophageal reflux disease)    H. pylori infection    Hearing loss of aging    Internal hemorrhoids without mention of complication    Lipoma of unspecified site    Loss of weight    Mild cognitive impairment, so stated    Osteoarthrosis, unspecified whether generalized or localized, unspecified site    Pain in joint, forearm    Personal history  of colonic adenoma 06/25/2012   Rash and other nonspecific skin eruption    Routine general medical examination at a health care facility    Substance abuse (HCC)    Tinnitus of both ears    Tobacco use disorder    Unspecified visual loss    Past Surgical History:  Procedure Laterality Date   COLONOSCOPY     left shoulder  1990   POLYPECTOMY      No Known Allergies  Allergies as of 11/27/2022   No Known Allergies      Medication List        Accurate as of November 27, 2022 10:19 AM. If you have any questions, ask your nurse or doctor.          FOLIC ACID PO Take 1 tablet by mouth daily.   mirtazapine 15 MG tablet Commonly known as: REMERON TAKE 1 TABLET BY MOUTH EVERYDAY AT BEDTIME   MULTIVITAMIN PO Take 1 tablet by mouth daily.   omeprazole 20 MG capsule Commonly known as: PRILOSEC TAKE 1 CAPSULE BY MOUTH TWICE  DAILY BEFORE A MEAL   simethicone 80 MG chewable tablet Commonly known as: Gas-X Chew 1 tablet (80 mg total) by mouth every 6 (six) hours as needed for flatulence.   VITAMIN B-12 PO Take 1 tablet by mouth daily.        Review of Systems  Constitutional:  Negative for appetite change, chills, fatigue, fever  and unexpected weight change.  HENT:  Negative for congestion, dental problem, ear discharge, ear pain, facial swelling, hearing loss, nosebleeds, postnasal drip, rhinorrhea, sinus pressure, sinus pain, sneezing, sore throat, tinnitus and trouble swallowing.   Eyes:  Negative for pain, discharge, redness, itching and visual disturbance.  Respiratory:  Negative for cough, chest tightness, shortness of breath and wheezing.   Cardiovascular:  Negative for chest pain, palpitations and leg swelling.  Gastrointestinal:  Negative for abdominal distention, abdominal pain, blood in stool, constipation, diarrhea, nausea and vomiting.  Endocrine: Negative for cold intolerance, heat intolerance, polydipsia, polyphagia and polyuria.  Genitourinary:  Negative  for difficulty urinating, dysuria, flank pain, frequency and urgency.  Musculoskeletal:  Negative for arthralgias, back pain, gait problem, joint swelling, myalgias, neck pain and neck stiffness.  Skin:  Negative for color change, pallor, rash and wound.  Neurological:  Negative for dizziness, syncope, speech difficulty, weakness, light-headedness, numbness and headaches.  Hematological:  Does not bruise/bleed easily.  Psychiatric/Behavioral:  Negative for agitation, behavioral problems, confusion, hallucinations and sleep disturbance. The patient is not nervous/anxious.     Immunization History  Administered Date(s) Administered   DTaP 07[birth year]08/1956   Fluad Quad(high Dose 65+) 03/19/2019, 03/23/2020, 05/25/2021   Hep A / Hep B 09/05/2014, 10/10/2014, 03/20/2015   Hepatitis B, ADULT 09/03/2017, 02/11/2018   Hepatitis B, PED/ADOLESCENT 07/29/2017   Influenza, High Dose Seasonal PF 02/17/2017, 01/22/2018   Influenza,inj,Quad PF,6+ Mos 03/24/2014, 03/17/2015, 12/21/2015   Moderna SARS-COV2 Booster Vaccination 04/24/2020   Moderna Sars-Covid-2 Vaccination 06/25/2019, 07/26/2019   Pneumococcal Conjugate-13 06/24/2014   Pneumococcal Polysaccharide-23 07/07/2009, 08/03/2012   Td 07[birth year]08/1951   Tdap 05/17/2013, 03/06/2018   Varicella 07/07/1957   Zoster, Live 07/07/2004   Pertinent  Health Maintenance Due  Topic Date Due   INFLUENZA VACCINE  11/14/2022      05/24/2021    4:16 PM 11/22/2021   10:00 AM 05/16/2022   10:15 AM 09/10/2022   10:38 AM 11/27/2022   10:10 AM  Fall Risk  Falls in the past year? 0 0 0 0 0  Was there an injury with Fall? 0 0 0 0 0  Fall Risk Category Calculator 0 0 0 0 0  Fall Risk Category (Retired) Low Low     (RETIRED) Patient Fall Risk Level Low fall risk Low fall risk     Patient at Risk for Falls Due to No Fall Risks No Fall Risks No Fall Risks No Fall Risks No Fall Risks  Fall risk Follow up Falls evaluation completed Falls evaluation completed Falls evaluation  completed Falls evaluation completed Falls evaluation completed   Functional Status Survey:    Vitals:   11/27/22 0850  BP: 122/88  Pulse: 60  Resp: 18  Temp: (!) 97.3 F (36.3 C)  SpO2: 95%  Weight: 126 lb 6.4 oz (57.3 kg)  Height: 5\' 5"  (1.651 m)   Body mass index is 21.03 kg/m. Physical Exam Vitals reviewed.  Constitutional:      General: He is not in acute distress.    Appearance: Normal appearance. He is normal weight. He is not ill-appearing or diaphoretic.  HENT:     Head: Normocephalic.     Right Ear: Tympanic membrane, ear canal and external ear normal. There is no impacted cerumen.     Left Ear: Tympanic membrane, ear canal and external ear normal. There is no impacted cerumen.     Nose: Nose normal. No congestion or rhinorrhea.     Mouth/Throat:     Mouth: Mucous membranes  are moist.     Pharynx: Oropharynx is clear. No oropharyngeal exudate or posterior oropharyngeal erythema.  Eyes:     General: No scleral icterus.       Right eye: No discharge.        Left eye: No discharge.     Extraocular Movements: Extraocular movements intact.     Conjunctiva/sclera: Conjunctivae normal.     Pupils: Pupils are equal, round, and reactive to light.  Neck:     Vascular: No carotid bruit.  Cardiovascular:     Rate and Rhythm: Normal rate and regular rhythm.     Pulses: Normal pulses.     Heart sounds: Normal heart sounds. No murmur heard.    No friction rub. No gallop.  Pulmonary:     Effort: Pulmonary effort is normal. No respiratory distress.     Breath sounds: Normal breath sounds. No wheezing, rhonchi or rales.  Chest:     Chest wall: No tenderness.  Abdominal:     General: Bowel sounds are normal. There is no distension.     Palpations: Abdomen is soft. There is no mass.     Tenderness: There is no abdominal tenderness. There is no right CVA tenderness, left CVA tenderness, guarding or rebound.  Musculoskeletal:        General: No swelling or tenderness.  Normal range of motion.     Cervical back: Normal range of motion. No rigidity or tenderness.     Right lower leg: No edema.     Left lower leg: No edema.  Lymphadenopathy:     Cervical: No cervical adenopathy.  Skin:    General: Skin is warm and dry.     Coloration: Skin is not pale.     Findings: No bruising, erythema, lesion or rash.  Neurological:     Mental Status: He is alert and oriented to person, place, and time.     Cranial Nerves: No cranial nerve deficit.     Sensory: No sensory deficit.     Motor: No weakness.     Coordination: Coordination normal.     Gait: Gait normal.  Psychiatric:        Mood and Affect: Mood normal.        Speech: Speech normal.        Behavior: Behavior normal.        Thought Content: Thought content normal.        Judgment: Judgment normal.     Labs reviewed: Recent Labs    05/27/22 1150  NA 137  K 4.2  CL 104  CO2 22  GLUCOSE 83  BUN 8  CREATININE 0.90  CALCIUM 9.4   Recent Labs    05/27/22 1150  AST 17  ALT 13  BILITOT 1.1  PROT 7.8   Recent Labs    05/27/22 1150  WBC 5.4  NEUTROABS 3,191  HGB 13.5  HCT 39.6  MCV 92.7  PLT 239   Lab Results  Component Value Date   TSH 1.04 09/03/2018   Lab Results  Component Value Date   HGBA1C 4.6 07/27/2020   Lab Results  Component Value Date   CHOL 115 05/27/2022   HDL 56 05/27/2022   LDLCALC 46 05/27/2022   TRIG 44 05/27/2022   CHOLHDL 2.1 05/27/2022    Significant Diagnostic Results in last 30 days:  No results found.  Assessment/Plan  1. Iron deficiency anemia, unspecified iron deficiency anemia type Previous Hgb improved.No reports of dark or black stool  Asymptomatic  - CBC with Differential/Platelet  2. Gastroesophageal reflux disease, unspecified whether esophagitis present Continue to require Omeprazole daily H/H stable.No tarry or black stool  - advised to avoid eating meals late in the evening and to avoid aggravating foods and spices. -  continue on Omeprazole  - COMPLETE METABOLIC PANEL WITH GFR  3. Coronary artery disease involving native coronary artery of native heart without angina pectoris Chest pain free - Lipid panel - TSH  4. Centrilobular emphysema (HCC) Breathing stable  - Lungs clear  - COMPLETE METABOLIC PANEL WITH GFR - CBC with Differential/Platelet  5. Stenosis of cervical spine with myelopathy (HCC) Reports no pain  - continue to monitor   6. Chronic hepatitis C without hepatic coma (HCC) Stable   7. Tobacco use disorder, continuous Smoking cessation advised declined Nicotine patch or Wellbutrin.   Family/ staff Communication: Reviewed plan of care with patient and wife verbalized understanding   Labs/tests ordered:  - COMPLETE METABOLIC PANEL WITH GFR - CBC with Differential/Platelet - Lipid panel - TSH  Next Appointment : Return in about 6 months (around 05/30/2023) for medical mangement of chronic issues.Caesar Bookman, NP

## 2022-11-28 LAB — COMPLETE METABOLIC PANEL WITHOUT GFR
AG Ratio: 1.5 (calc) (ref 1.0–2.5)
ALT: 9 U/L (ref 9–46)
AST: 14 U/L (ref 10–35)
Albumin: 4.8 g/dL (ref 3.6–5.1)
Alkaline phosphatase (APISO): 61 U/L (ref 35–144)
BUN: 9 mg/dL (ref 7–25)
CO2: 21 mmol/L (ref 20–32)
Calcium: 10 mg/dL (ref 8.6–10.3)
Chloride: 103 mmol/L (ref 98–110)
Creat: 0.94 mg/dL (ref 0.70–1.28)
Globulin: 3.1 g/dL (ref 1.9–3.7)
Glucose, Bld: 93 mg/dL (ref 65–99)
Potassium: 4.2 mmol/L (ref 3.5–5.3)
Sodium: 135 mmol/L (ref 135–146)
Total Bilirubin: 1.1 mg/dL (ref 0.2–1.2)
Total Protein: 7.9 g/dL (ref 6.1–8.1)
eGFR: 83 mL/min/1.73m2 (ref >=60)

## 2022-11-28 LAB — CBC WITH DIFFERENTIAL/PLATELET
Absolute Monocytes: 292 {cells}/uL (ref 200–950)
Basophils Absolute: 51 {cells}/uL (ref 0–200)
Basophils Relative: 1.5 %
Eosinophils Absolute: 82 {cells}/uL (ref 15–500)
Eosinophils Relative: 2.4 %
HCT: 39.8 % (ref 38.5–50.0)
Hemoglobin: 13.4 g/dL (ref 13.2–17.1)
Lymphs Abs: 1656 {cells}/uL (ref 850–3900)
MCH: 30.9 pg (ref 27.0–33.0)
MCHC: 33.7 g/dL (ref 32.0–36.0)
MCV: 91.9 fL (ref 80.0–100.0)
MPV: 11.2 fL (ref 7.5–12.5)
Monocytes Relative: 8.6 %
Neutro Abs: 1319 {cells}/uL — ABNORMAL LOW (ref 1500–7800)
Neutrophils Relative %: 38.8 %
Platelets: 172 10*3/uL (ref 140–400)
RBC: 4.33 10*6/uL (ref 4.20–5.80)
RDW: 12.4 % (ref 11.0–15.0)
Total Lymphocyte: 48.7 %
WBC: 3.4 10*3/uL — ABNORMAL LOW (ref 3.8–10.8)

## 2022-11-28 LAB — LIPID PANEL
Cholesterol: 141 mg/dL (ref <200)
HDL: 58 mg/dL (ref >=40)
LDL Cholesterol (Calc): 69 mg/dL
Non-HDL Cholesterol (Calc): 83 mg/dL (ref <130)
Total CHOL/HDL Ratio: 2.4 (calc) (ref <5.0)
Triglycerides: 61 mg/dL (ref <150)

## 2022-11-28 LAB — TSH: TSH: 1.39 m[IU]/L (ref 0.40–4.50)

## 2023-04-02 ENCOUNTER — Encounter: Payer: Self-pay | Admitting: Podiatry

## 2023-04-02 ENCOUNTER — Ambulatory Visit: Payer: Medicare Other | Admitting: Podiatry

## 2023-04-02 DIAGNOSIS — M79675 Pain in left toe(s): Secondary | ICD-10-CM | POA: Diagnosis not present

## 2023-04-02 DIAGNOSIS — M79674 Pain in right toe(s): Secondary | ICD-10-CM | POA: Diagnosis not present

## 2023-04-02 DIAGNOSIS — B351 Tinea unguium: Secondary | ICD-10-CM | POA: Diagnosis not present

## 2023-04-02 NOTE — Progress Notes (Signed)
This patient presents to the office with chief complaint of long thick painful nails.  Patient says the nails are painful walking and wearing shoes.  This patient is unable to self treat.  This patient is unable to trim her nails since she is unable to reach her nails.  She presents to the office for preventative foot care services. Patient has not been seen in about  10 months .  General Appearance  Alert, conversant and in no acute stress.  Vascular  Dorsalis pedis and posterior tibial  pulses are palpable  bilaterally.  Capillary return is within normal limits  bilaterally. Temperature is within normal limits  bilaterally.  Neurologic  Senn-Weinstein monofilament wire test within normal limits  bilaterally. Muscle power within normal limits bilaterally.  Nails Thick disfigured discolored nails with subungual debris  from hallux to fifth toes bilaterally. No evidence of bacterial infection or drainage bilaterally.  Orthopedic  No limitations of motion  feet .  No crepitus or effusions noted.  No bony pathology or digital deformities noted.  Skin  normotropic skin with no porokeratosis noted bilaterally.  No signs of infections or ulcers noted.     Onychomycosis  Nails  B/L.  Pain in right toes  Pain in left toes  Debridement of nails both feet followed trimming the nails with dremel tool.    RTC  prn   Helane Gunther DPM

## 2023-06-02 ENCOUNTER — Ambulatory Visit (INDEPENDENT_AMBULATORY_CARE_PROVIDER_SITE_OTHER): Payer: Medicare Other | Admitting: Family

## 2023-06-02 ENCOUNTER — Encounter: Payer: Self-pay | Admitting: Family

## 2023-06-02 ENCOUNTER — Other Ambulatory Visit: Payer: Self-pay

## 2023-06-02 VITALS — BP 136/84 | HR 95 | Temp 97.7°F | Resp 18 | Ht 65.0 in | Wt 131.8 lb

## 2023-06-02 DIAGNOSIS — R630 Anorexia: Secondary | ICD-10-CM | POA: Diagnosis not present

## 2023-06-02 DIAGNOSIS — H25013 Cortical age-related cataract, bilateral: Secondary | ICD-10-CM | POA: Diagnosis not present

## 2023-06-02 DIAGNOSIS — I2510 Atherosclerotic heart disease of native coronary artery without angina pectoris: Secondary | ICD-10-CM | POA: Diagnosis not present

## 2023-06-02 DIAGNOSIS — R634 Abnormal weight loss: Secondary | ICD-10-CM | POA: Diagnosis not present

## 2023-06-02 DIAGNOSIS — M4802 Spinal stenosis, cervical region: Secondary | ICD-10-CM

## 2023-06-02 DIAGNOSIS — J432 Centrilobular emphysema: Secondary | ICD-10-CM | POA: Diagnosis not present

## 2023-06-02 DIAGNOSIS — B182 Chronic viral hepatitis C: Secondary | ICD-10-CM

## 2023-06-02 DIAGNOSIS — F17209 Nicotine dependence, unspecified, with unspecified nicotine-induced disorders: Secondary | ICD-10-CM

## 2023-06-02 DIAGNOSIS — K219 Gastro-esophageal reflux disease without esophagitis: Secondary | ICD-10-CM | POA: Diagnosis not present

## 2023-06-02 DIAGNOSIS — Z23 Encounter for immunization: Secondary | ICD-10-CM

## 2023-06-02 DIAGNOSIS — G992 Myelopathy in diseases classified elsewhere: Secondary | ICD-10-CM | POA: Diagnosis not present

## 2023-06-02 DIAGNOSIS — F1010 Alcohol abuse, uncomplicated: Secondary | ICD-10-CM

## 2023-06-02 MED ORDER — MIRTAZAPINE 15 MG PO TABS
ORAL_TABLET | ORAL | 1 refills | Status: DC
Start: 2023-06-02 — End: 2023-06-02

## 2023-06-02 MED ORDER — MIRTAZAPINE 15 MG PO TABS
ORAL_TABLET | ORAL | 1 refills | Status: DC
  Filled 2023-06-02: qty 90, 90d supply, fill #0

## 2023-06-02 MED ORDER — MULTIVITAMIN PO TABS
1.0000 | ORAL_TABLET | Freq: Every day | ORAL | 1 refills | Status: AC
Start: 2023-06-02 — End: ?
  Filled 2023-06-02: qty 100, 100d supply, fill #0

## 2023-06-02 MED ORDER — VITAMIN B-12 1000 MCG PO TABS
1000.0000 ug | ORAL_TABLET | Freq: Every day | ORAL | 1 refills | Status: AC
Start: 2023-06-02 — End: ?
  Filled 2023-06-02: qty 90, 90d supply, fill #0

## 2023-06-02 MED ORDER — FOLIC ACID 400 MCG PO TABS
400.0000 ug | ORAL_TABLET | Freq: Every day | ORAL | 1 refills | Status: DC
Start: 2023-06-02 — End: 2023-06-21
  Filled 2023-06-02: qty 90, 90d supply, fill #0

## 2023-06-02 NOTE — Progress Notes (Signed)
 Provider: Richarda Blade FNP-C   Eliyas Suddreth, Donalee Citrin, NP  Patient Care Team: Ermon Sagan, Donalee Citrin, NP as PCP - General (Family Medicine) Kermit Balo, DO (Geriatric Medicine)  Extended Emergency Contact Information Primary Emergency Contact: [name],[name] Address: [address]          [place] [address] [place] Home Phone: [phone] Relation: Spouse  Code Status:  Full Code  Goals of care: Advanced Directive information    06/02/2023   10:39 AM  Advanced Directives  Does Patient Have a Medical Advance Directive? No  Would patient like information on creating a medical advance directive? No - Patient declined     Chief Complaint  Patient presents with   Medical Management of Chronic Issues    6 month follow up and discuss covid and flu vaccines.     Discussed the use of AI scribe software for clinical note transcription with the patient, who gave verbal consent to proceed.  History of Present Illness   Ryan Ortega is a 78 year old male who presents for a six-month follow-up regarding weight gain.  He has been taking Remnone 15 mg to aid in weight gain, but feels it has not been effective despite an increase in weight from 126 lbs to 131.8 lbs since the last visit. He is currently taking only this medication for weight gain and has about three to four pills left. He is not taking folic acid, multivitamins, omeprazole, simethicone, or vitamin B12, citing the high cost of multivitamins as a concern.  He has a history of emphysema but has no recent cough, shortness of breath, or wheezing. He also denies neck pain, which was previously associated with stenosis.  He has a history of coronary artery disease but reports no recent chest pain. He does not take a daily baby aspirin. No recent episodes of gout, which previously affected his right foot.  He smokes one pack of cigarettes every three days and has attempted to cut down by selling some of his  cigarettes. He drinks approximately six beers per day and occasionally shares with friends. He smokes marijuana but not daily.  No issues with bowel movements, constipation, or diarrhea. He does not consume alcohol other than beer. He has not experienced any transportation issues but mentions the high cost of medications as a concern.         Past Medical History:  Diagnosis Date   Allergy    Chronic hepatitis C without mention of hepatic coma    GERD (gastroesophageal reflux disease)    H. pylori infection    Hearing loss of aging    Internal hemorrhoids without mention of complication    Lipoma of unspecified site    Loss of weight    Mild cognitive impairment, so stated    Osteoarthrosis, unspecified whether generalized or localized, unspecified site    Pain in joint, forearm    Personal history of colonic adenoma 06/25/2012   Rash and other nonspecific skin eruption    Routine general medical examination at a health care facility    Substance abuse (HCC)    Tinnitus of both ears    Tobacco use disorder    Unspecified visual loss    Past Surgical History:  Procedure Laterality Date   COLONOSCOPY     left shoulder  1990   POLYPECTOMY      No Known Allergies  Allergies as of 06/02/2023   No Known Allergies      Medication  List        Accurate as of June 02, 2023 12:02 PM. If you have any questions, ask your nurse or doctor.          STOP taking these medications    omeprazole 20 MG capsule Commonly known as: PRILOSEC Stopped by: Donalee Citrin Damyan Corne   simethicone 80 MG chewable tablet Commonly known as: Gas-X Stopped by: Donalee Citrin Emerick Weatherly       TAKE these medications    cyanocobalamin 1000 MCG tablet Commonly known as: VITAMIN B12 Take 1 tablet (1,000 mcg total) by mouth daily. What changed:  medication strength how much to take Changed by: Christianne Zacher C Boluwatife Mutchler   folic acid 400 MCG tablet Commonly known as: FOLVITE Take 1 tablet (400 mcg total) by  mouth daily. What changed:  medication strength how much to take Changed by: Cassi Jenne C Panda Crossin   mirtazapine 15 MG tablet Commonly known as: REMERON TAKE 1 TABLET BY MOUTH EVERYDAY AT BEDTIME   Multivitamin Tabs Take 1 tablet by mouth daily.        Review of Systems  Constitutional:  Negative for appetite change, chills, fatigue, fever and unexpected weight change.       Has gained weight 5.8 lbs over six months   HENT:  Negative for congestion, dental problem, ear discharge, ear pain, facial swelling, hearing loss, nosebleeds, postnasal drip, rhinorrhea, sinus pressure, sinus pain, sneezing, sore throat, tinnitus and trouble swallowing.   Eyes:  Negative for pain, discharge, redness, itching and visual disturbance.  Respiratory:  Negative for cough, chest tightness, shortness of breath and wheezing.   Cardiovascular:  Negative for chest pain, palpitations and leg swelling.  Gastrointestinal:  Negative for abdominal distention, abdominal pain, blood in stool, constipation, diarrhea, nausea and vomiting.  Endocrine: Negative for cold intolerance, heat intolerance, polydipsia, polyphagia and polyuria.  Genitourinary:  Negative for difficulty urinating, dysuria, flank pain, frequency and urgency.  Musculoskeletal:  Negative for arthralgias, back pain, gait problem, joint swelling, myalgias, neck pain and neck stiffness.  Skin:  Negative for color change, pallor, rash and wound.  Neurological:  Negative for dizziness, syncope, speech difficulty, weakness, light-headedness, numbness and headaches.  Hematological:  Does not bruise/bleed easily.  Psychiatric/Behavioral:  Negative for agitation, behavioral problems, confusion, hallucinations, self-injury, sleep disturbance and suicidal ideas. The patient is not nervous/anxious.     Immunization History  Administered Date(s) Administered   DTaP 07/08/1956   Fluad Quad(high Dose 65+) 03/19/2019, 03/23/2020, 05/25/2021   Fluad Trivalent(High  Dose 65+) 06/02/2023   Hep A / Hep B 09/05/2014, 10/10/2014, 03/20/2015   Hepatitis B, ADULT 09/03/2017, 02/11/2018   Hepatitis B, PED/ADOLESCENT 07/29/2017   Influenza, High Dose Seasonal PF 02/17/2017, 01/22/2018   Influenza,inj,Quad PF,6+ Mos 03/24/2014, 03/17/2015, 12/21/2015   Moderna SARS-COV2 Booster Vaccination 04/24/2020   Moderna Sars-Covid-2 Vaccination 06/25/2019, 07/26/2019   Pneumococcal Conjugate-13 06/24/2014   Pneumococcal Polysaccharide-23 07/07/2009, 08/03/2012   Td 07/08/1952   Tdap 05/17/2013, 03/06/2018   Varicella 07/07/1957   Zoster, Live 07/07/2004   Pertinent  Health Maintenance Due  Topic Date Due   INFLUENZA VACCINE  Completed      11/22/2021   10:00 AM 05/16/2022   10:15 AM 09/10/2022   10:38 AM 11/27/2022   10:10 AM 06/02/2023   10:39 AM  Fall Risk  Falls in the past year? 0 0 0 0 1  Was there an injury with Fall? 0 0 0 0 0  Fall Risk Category Calculator 0 0 0 0 1  Fall Risk Category (  Retired) Low      (RETIRED) Patient Fall Risk Level Low fall risk      Patient at Risk for Falls Due to No Fall Risks No Fall Risks No Fall Risks No Fall Risks   Fall risk Follow up Falls evaluation completed Falls evaluation completed Falls evaluation completed Falls evaluation completed    Functional Status Survey:    Vitals:   06/02/23 1041  BP: 136/84  Pulse: 95  Resp: 18  Temp: 97.7 F (36.5 C)  SpO2: 96%  Weight: 131 lb 12.8 oz (59.8 kg)  Height: 5\' 5"  (1.651 m)   Body mass index is 21.93 kg/m. Physical Exam Vitals reviewed.  Constitutional:      General: He is not in acute distress.    Appearance: Normal appearance. He is normal weight. He is not ill-appearing or diaphoretic.  HENT:     Head: Normocephalic.     Right Ear: Tympanic membrane, ear canal and external ear normal. There is no impacted cerumen.     Left Ear: Tympanic membrane, ear canal and external ear normal. There is no impacted cerumen.     Nose: Nose normal. No congestion or  rhinorrhea.     Mouth/Throat:     Mouth: Mucous membranes are moist.     Pharynx: Oropharynx is clear. No oropharyngeal exudate or posterior oropharyngeal erythema.     Comments: Missing all teeth  Eyes:     General: No scleral icterus.       Right eye: No discharge.        Left eye: No discharge.     Extraocular Movements: Extraocular movements intact.     Conjunctiva/sclera: Conjunctivae normal.     Pupils: Pupils are equal, round, and reactive to light.  Neck:     Vascular: No carotid bruit.  Cardiovascular:     Rate and Rhythm: Normal rate and regular rhythm.     Pulses: Normal pulses.     Heart sounds: Normal heart sounds. No murmur heard.    No friction rub. No gallop.  Pulmonary:     Effort: Pulmonary effort is normal. No respiratory distress.     Breath sounds: Normal breath sounds. No wheezing, rhonchi or rales.  Chest:     Chest wall: No tenderness.  Abdominal:     General: Bowel sounds are normal. There is no distension.     Palpations: Abdomen is soft. There is no mass.     Tenderness: There is no abdominal tenderness. There is no right CVA tenderness, left CVA tenderness, guarding or rebound.  Musculoskeletal:        General: No swelling or tenderness. Normal range of motion.     Cervical back: Normal range of motion. No rigidity or tenderness.     Right lower leg: No edema.     Left lower leg: No edema.  Lymphadenopathy:     Cervical: No cervical adenopathy.  Skin:    General: Skin is warm and dry.     Coloration: Skin is not pale.     Findings: No bruising, erythema, lesion or rash.  Neurological:     Mental Status: He is alert and oriented to person, place, and time.     Cranial Nerves: No cranial nerve deficit.     Sensory: No sensory deficit.     Motor: No weakness.     Coordination: Coordination normal.     Gait: Gait normal.  Psychiatric:        Mood and Affect: Mood normal.  Speech: Speech normal.        Behavior: Behavior normal.         Thought Content: Thought content normal.        Judgment: Judgment normal.     Labs reviewed: Recent Labs    11/27/22 1040  NA 135  K 4.2  CL 103  CO2 21  GLUCOSE 93  BUN 9  CREATININE 0.94  CALCIUM 10.0   Recent Labs    11/27/22 1040  AST 14  ALT 9  BILITOT 1.1  PROT 7.9   Recent Labs    11/27/22 1040  WBC 3.4*  NEUTROABS 1,319*  HGB 13.4  HCT 39.8  MCV 91.9  PLT 172   Lab Results  Component Value Date   TSH 1.39 11/27/2022   Lab Results  Component Value Date   HGBA1C 4.6 07/27/2020   Lab Results  Component Value Date   CHOL 141 11/27/2022   HDL 58 11/27/2022   LDLCALC 69 11/27/2022   TRIG 61 11/27/2022   CHOLHDL 2.4 11/27/2022    Significant Diagnostic Results in last 30 days:  No results found.  Assessment/Plan  Weight loss Reports weight gain with current medication regimen. Weight increased from 126 lbs to 131.8 lbs. Blood pressure 136/95 mmHg, heart rate 95 bpm, temperature 97.25F, oxygen saturation 96%. Discussed preference to continue current medication despite concerns about efficacy. Explained weight gain is a positive outcome. - Continue current medication regimen - Send prescription for weight gain medication - mirtazapine (REMERON) 15 MG tablet; TAKE 1 TABLET BY MOUTH EVERYDAY AT BEDTIME  Dispense: 90 tablet; Refill: 1  Tobacco Use Disorder Smokes one pack of cigarettes every three days. Advised to reduce smoking due to its impact on health, including emphysema and coronary artery disease. Discussed risks of smoking and benefits of cessation. - Advise to reduce smoking - Discuss risks of smoking and benefits of cessation  Alcohol Use Disorder Consumes approximately six beers per day. Advised to reduce alcohol consumption to prevent nutrient deficiencies and support weight gain. Discussed risks of excessive alcohol intake and benefits of reduction. - Advise to reduce alcohol consumption - Discuss risks of excessive alcohol intake and  benefits of reduction - folic acid (FOLVITE) 400 MCG tablet; Take 1 tablet (400 mcg total) by mouth daily.  Dispense: 90 tablet; Refill: 1 - Multiple Vitamin (MULTIVITAMIN) TABS; Take 1 tablet by mouth daily.  Dispense: 100 tablet; Refill: 1 - cyanocobalamin (VITAMIN B12) 1000 MCG tablet; Take 1 tablet (1,000 mcg total) by mouth daily.  Dispense: 90 tablet; Refill: 1  Emphysema No current symptoms of cough, shortness of breath, or wheezing. Discussed importance of monitoring respiratory symptoms and reducing smoking to prevent exacerbations. - Monitor respiratory symptoms - COMPLETE METABOLIC PANEL WITH GFR - CBC with Differential/Platelet  Coronary Artery Disease No current chest pain. Advised to take baby aspirin daily to prevent arterial plaque buildup, especially due to smoking. Explained aspirin helps to clear arteries and reduce the risk of heart attacks. - Recommend daily baby aspirin - Check for samples of baby aspirin to provide - Lipid panel  Chronic Hepatitis C No current symptoms or treatment discussed. Will continue to monitor liver function. - Monitor liver function  Cervical Stenosis No current neck pain reported. Discussed importance of monitoring for symptoms and maintaining neck mobility. - Monitor for symptoms  Gout No current symptoms reported. Discussed importance of monitoring for symptoms and maintaining a healthy diet to prevent flare-ups. - Monitor for symptoms of gout  Cataracts Advised  to continue wearing glasses and follow up with an eye doctor. Discussed importance of regular eye exams to monitor progression. - Recommend follow-up with an eye doctor  Marijuana use - smoking cessation advised   Need for influenza vaccination (Primary) Afebrile  Flut shot administered by CMA no acute reaction reported.  - Flu Vaccine Trivalent High Dose (Fluad)   Poor appetite Continue on Remeron a below. - mirtazapine (REMERON) 15 MG tablet; TAKE 1 TABLET BY  MOUTH EVERYDAY AT BEDTIME  Dispense: 90 tablet; Refill: 1  Gastroesophageal reflux disease without esophagitis Symptoms controlled. H/H stable.No tarry or black stool  - advised to avoid eating meals late in the evening and to avoid aggravating foods and spices. - continue on Omeprazole  - CBC with Differential/Platelet  General Health Maintenance Received a flu shot. Advised to take multivitamins, vitamin B12, and folic acid to address nutrient deficiencies due to alcohol consumption. Explained these supplements are important for overall health and to support weight gain. - Recommend over-the-counter multivitamin - Recommend over-the-counter vitamin B12 - Recommend over-the-counter folic acid - Order lab work to check cholesterol, thyroid, kidney, liver, electrolytes, and anemia  Follow-up - Schedule six-month follow-up appointment - Send prescription to [facility] community pharmacy for medication assistance  - Provide address and contact information for community pharmacy.   Family/ staff Communication: Reviewed plan of care with patient verbalized understanding   Labs/tests ordered:  - TSH - COMPLETE METABOLIC PANEL WITH GFR - CBC with Differential/Platelet - Lipid panel  Next Appointment : Return in about 6 months (around 11/30/2023) for medical mangement of chronic issues.Marland Kitchen   Spent 30 minutes of Face to face with patient  >50% time spent counseling; reviewing medical record; tests; labs; documentation and developing future plan of care.   Caesar Bookman, NP

## 2023-06-02 NOTE — Patient Instructions (Signed)
 Take a multivitamin one by mouth daily along with folic acid 1 mg tablet daily

## 2023-06-03 LAB — COMPLETE METABOLIC PANEL WITH GFR
AG Ratio: 1.3 (calc) (ref 1.0–2.5)
ALT: 8 U/L — ABNORMAL LOW (ref 9–46)
AST: 14 U/L (ref 10–35)
Albumin: 4.6 g/dL (ref 3.6–5.1)
Alkaline phosphatase (APISO): 80 U/L (ref 35–144)
BUN: 10 mg/dL (ref 7–25)
CO2: 24 mmol/L (ref 20–32)
Calcium: 9.8 mg/dL (ref 8.6–10.3)
Chloride: 100 mmol/L (ref 98–110)
Creat: 0.84 mg/dL (ref 0.70–1.28)
Globulin: 3.6 g/dL (ref 1.9–3.7)
Glucose, Bld: 97 mg/dL (ref 65–99)
Potassium: 3.7 mmol/L (ref 3.5–5.3)
Sodium: 136 mmol/L (ref 135–146)
Total Bilirubin: 1.4 mg/dL — ABNORMAL HIGH (ref 0.2–1.2)
Total Protein: 8.2 g/dL — ABNORMAL HIGH (ref 6.1–8.1)
eGFR: 89 mL/min/{1.73_m2} (ref >=60)

## 2023-06-03 LAB — CBC WITH DIFFERENTIAL/PLATELET
Absolute Lymphocytes: 1238 {cells}/uL (ref 850–3900)
Absolute Monocytes: 525 {cells}/uL (ref 200–950)
Basophils Absolute: 26 {cells}/uL (ref 0–200)
Basophils Relative: 0.3 %
Eosinophils Absolute: 9 {cells}/uL — ABNORMAL LOW (ref 15–500)
Eosinophils Relative: 0.1 %
HCT: 33.8 % — ABNORMAL LOW (ref 38.5–50.0)
Hemoglobin: 11.2 g/dL — ABNORMAL LOW (ref 13.2–17.1)
MCH: 28.2 pg (ref 27.0–33.0)
MCHC: 33.1 g/dL (ref 32.0–36.0)
MCV: 85.1 fL (ref 80.0–100.0)
MPV: 10.2 fL (ref 7.5–12.5)
Monocytes Relative: 6.1 %
Neutro Abs: 6803 {cells}/uL (ref 1500–7800)
Neutrophils Relative %: 79.1 %
Platelets: 187 10*3/uL (ref 140–400)
RBC: 3.97 10*6/uL — ABNORMAL LOW (ref 4.20–5.80)
RDW: 15.6 % — ABNORMAL HIGH (ref 11.0–15.0)
Total Lymphocyte: 14.4 %
WBC: 8.6 10*3/uL (ref 3.8–10.8)

## 2023-06-03 LAB — LIPID PANEL
Cholesterol: 133 mg/dL (ref <200)
HDL: 79 mg/dL (ref >=40)
LDL Cholesterol (Calc): 41 mg/dL
Non-HDL Cholesterol (Calc): 54 mg/dL (ref <130)
Total CHOL/HDL Ratio: 1.7 (calc) (ref <5.0)
Triglycerides: 53 mg/dL (ref <150)

## 2023-06-03 LAB — TSH: TSH: 1.19 m[IU]/L (ref 0.40–4.50)

## 2023-06-09 ENCOUNTER — Other Ambulatory Visit: Payer: Self-pay | Admitting: Family

## 2023-06-09 DIAGNOSIS — D509 Iron deficiency anemia, unspecified: Secondary | ICD-10-CM

## 2023-06-10 ENCOUNTER — Other Ambulatory Visit: Payer: Self-pay

## 2023-06-11 ENCOUNTER — Other Ambulatory Visit: Payer: Self-pay

## 2023-06-13 ENCOUNTER — Other Ambulatory Visit: Payer: Self-pay

## 2023-06-13 DIAGNOSIS — D509 Iron deficiency anemia, unspecified: Secondary | ICD-10-CM | POA: Diagnosis not present

## 2023-06-14 LAB — FECAL GLOBIN BY IMMUNOCHEMISTRY
FECAL GLOBIN RESULT:: NOT DETECTED
MICRO NUMBER:: 16144003
SPECIMEN QUALITY:: ADEQUATE

## 2023-06-19 ENCOUNTER — Encounter (HOSPITAL_COMMUNITY): Payer: Self-pay

## 2023-06-19 ENCOUNTER — Other Ambulatory Visit: Payer: Self-pay

## 2023-06-19 ENCOUNTER — Emergency Department (HOSPITAL_COMMUNITY)

## 2023-06-19 ENCOUNTER — Ambulatory Visit (HOSPITAL_COMMUNITY)
Admission: EM | Admit: 2023-06-19 | Discharge: 2023-06-19 | Disposition: A | Discharge status: Home / Self Care | Attending: Family Medicine | Admitting: Family Medicine

## 2023-06-19 ENCOUNTER — Ambulatory Visit (INDEPENDENT_AMBULATORY_CARE_PROVIDER_SITE_OTHER)

## 2023-06-19 ENCOUNTER — Inpatient Hospital Stay (HOSPITAL_COMMUNITY)
Admission: EM | Admit: 2023-06-19 | Discharge: 2023-06-21 | DRG: 564 | Disposition: A | Discharge status: Home Health | Attending: Internal Medicine | Admitting: Internal Medicine

## 2023-06-19 DIAGNOSIS — J439 Emphysema, unspecified: Secondary | ICD-10-CM | POA: Diagnosis present

## 2023-06-19 DIAGNOSIS — J9601 Acute respiratory failure with hypoxia: Secondary | ICD-10-CM

## 2023-06-19 DIAGNOSIS — R918 Other nonspecific abnormal finding of lung field: Secondary | ICD-10-CM | POA: Diagnosis not present

## 2023-06-19 DIAGNOSIS — S2222XA Fracture of body of sternum, initial encounter for closed fracture: Principal | ICD-10-CM | POA: Diagnosis present

## 2023-06-19 DIAGNOSIS — J189 Pneumonia, unspecified organism: Secondary | ICD-10-CM

## 2023-06-19 DIAGNOSIS — E876 Hypokalemia: Secondary | ICD-10-CM | POA: Diagnosis not present

## 2023-06-19 DIAGNOSIS — N179 Acute kidney failure, unspecified: Secondary | ICD-10-CM | POA: Diagnosis present

## 2023-06-19 DIAGNOSIS — Z79899 Other long term (current) drug therapy: Secondary | ICD-10-CM

## 2023-06-19 DIAGNOSIS — D649 Anemia, unspecified: Secondary | ICD-10-CM | POA: Diagnosis not present

## 2023-06-19 DIAGNOSIS — R7989 Other specified abnormal findings of blood chemistry: Secondary | ICD-10-CM | POA: Diagnosis not present

## 2023-06-19 DIAGNOSIS — B182 Chronic viral hepatitis C: Secondary | ICD-10-CM | POA: Diagnosis present

## 2023-06-19 DIAGNOSIS — R030 Elevated blood-pressure reading, without diagnosis of hypertension: Secondary | ICD-10-CM | POA: Diagnosis not present

## 2023-06-19 DIAGNOSIS — I700 Atherosclerosis of aorta: Secondary | ICD-10-CM | POA: Diagnosis not present

## 2023-06-19 DIAGNOSIS — K219 Gastro-esophageal reflux disease without esophagitis: Secondary | ICD-10-CM | POA: Diagnosis present

## 2023-06-19 DIAGNOSIS — J449 Chronic obstructive pulmonary disease, unspecified: Secondary | ICD-10-CM | POA: Diagnosis not present

## 2023-06-19 DIAGNOSIS — J440 Chronic obstructive pulmonary disease with acute lower respiratory infection: Secondary | ICD-10-CM | POA: Diagnosis present

## 2023-06-19 DIAGNOSIS — R0789 Other chest pain: Secondary | ICD-10-CM | POA: Diagnosis not present

## 2023-06-19 DIAGNOSIS — R0602 Shortness of breath: Secondary | ICD-10-CM | POA: Diagnosis not present

## 2023-06-19 DIAGNOSIS — Z823 Family history of stroke: Secondary | ICD-10-CM | POA: Diagnosis not present

## 2023-06-19 DIAGNOSIS — I2489 Other forms of acute ischemic heart disease: Secondary | ICD-10-CM | POA: Diagnosis not present

## 2023-06-19 DIAGNOSIS — R630 Anorexia: Secondary | ICD-10-CM

## 2023-06-19 DIAGNOSIS — H547 Unspecified visual loss: Secondary | ICD-10-CM | POA: Diagnosis present

## 2023-06-19 DIAGNOSIS — H9190 Unspecified hearing loss, unspecified ear: Secondary | ICD-10-CM | POA: Diagnosis present

## 2023-06-19 DIAGNOSIS — F1010 Alcohol abuse, uncomplicated: Secondary | ICD-10-CM | POA: Diagnosis present

## 2023-06-19 DIAGNOSIS — R079 Chest pain, unspecified: Secondary | ICD-10-CM

## 2023-06-19 DIAGNOSIS — W19XXXA Unspecified fall, initial encounter: Secondary | ICD-10-CM

## 2023-06-19 DIAGNOSIS — S2220XA Unspecified fracture of sternum, initial encounter for closed fracture: Principal | ICD-10-CM | POA: Diagnosis present

## 2023-06-19 DIAGNOSIS — Y92009 Unspecified place in unspecified non-institutional (private) residence as the place of occurrence of the external cause: Secondary | ICD-10-CM | POA: Diagnosis not present

## 2023-06-19 DIAGNOSIS — W010XXA Fall on same level from slipping, tripping and stumbling without subsequent striking against object, initial encounter: Secondary | ICD-10-CM | POA: Diagnosis present

## 2023-06-19 DIAGNOSIS — R0902 Hypoxemia: Secondary | ICD-10-CM | POA: Diagnosis not present

## 2023-06-19 DIAGNOSIS — F17210 Nicotine dependence, cigarettes, uncomplicated: Secondary | ICD-10-CM | POA: Diagnosis not present

## 2023-06-19 DIAGNOSIS — Z803 Family history of malignant neoplasm of breast: Secondary | ICD-10-CM | POA: Diagnosis not present

## 2023-06-19 DIAGNOSIS — R9431 Abnormal electrocardiogram [ECG] [EKG]: Secondary | ICD-10-CM | POA: Diagnosis present

## 2023-06-19 DIAGNOSIS — R634 Abnormal weight loss: Secondary | ICD-10-CM

## 2023-06-19 DIAGNOSIS — J984 Other disorders of lung: Secondary | ICD-10-CM | POA: Diagnosis not present

## 2023-06-19 LAB — BASIC METABOLIC PANEL
Anion gap: 16 — ABNORMAL HIGH (ref 5–15)
BUN: 17 mg/dL (ref 8–23)
CO2: 19 mmol/L — ABNORMAL LOW (ref 22–32)
Calcium: 9.7 mg/dL (ref 8.9–10.3)
Chloride: 103 mmol/L (ref 98–111)
Creatinine, Ser: 1.74 mg/dL — ABNORMAL HIGH (ref 0.61–1.24)
GFR, Estimated: 40 mL/min — ABNORMAL LOW (ref >60)
Glucose, Bld: 145 mg/dL — ABNORMAL HIGH (ref 70–99)
Potassium: 3.6 mmol/L (ref 3.5–5.1)
Sodium: 138 mmol/L (ref 135–145)

## 2023-06-19 LAB — HEPATIC FUNCTION PANEL
ALT: 19 U/L (ref 0–44)
AST: 40 U/L (ref 15–41)
Albumin: 3.6 g/dL (ref 3.5–5.0)
Alkaline Phosphatase: 68 U/L (ref 38–126)
Bilirubin, Direct: 0.6 mg/dL — ABNORMAL HIGH (ref 0.0–0.2)
Indirect Bilirubin: 1.1 mg/dL — ABNORMAL HIGH (ref 0.3–0.9)
Total Bilirubin: 1.7 mg/dL — ABNORMAL HIGH (ref 0.0–1.2)
Total Protein: 8 g/dL (ref 6.5–8.1)

## 2023-06-19 LAB — CBC
HCT: 39.2 % (ref 39.0–52.0)
Hemoglobin: 12.2 g/dL — ABNORMAL LOW (ref 13.0–17.0)
MCH: 27.5 pg (ref 26.0–34.0)
MCHC: 31.1 g/dL (ref 30.0–36.0)
MCV: 88.3 fL (ref 80.0–100.0)
Platelets: 270 10*3/uL (ref 150–400)
RBC: 4.44 MIL/uL (ref 4.22–5.81)
RDW: 16.2 % — ABNORMAL HIGH (ref 11.5–15.5)
WBC: 10.3 10*3/uL (ref 4.0–10.5)
nRBC: 0 % (ref 0.0–0.2)

## 2023-06-19 LAB — TROPONIN I (HIGH SENSITIVITY)
Troponin I (High Sensitivity): 18 ng/L — ABNORMAL HIGH (ref <18)
Troponin I (High Sensitivity): 21 ng/L — ABNORMAL HIGH (ref <18)

## 2023-06-19 LAB — BRAIN NATRIURETIC PEPTIDE: B Natriuretic Peptide: 111.7 pg/mL — ABNORMAL HIGH (ref 0.0–100.0)

## 2023-06-19 LAB — PROCALCITONIN: Procalcitonin: 0.58 ng/mL

## 2023-06-19 LAB — CK: Total CK: 56 U/L (ref 49–397)

## 2023-06-19 MED ORDER — ACETAMINOPHEN 325 MG PO TABS
650.0000 mg | ORAL_TABLET | Freq: Four times a day (QID) | ORAL | Status: DC | PRN
  Administered 2023-06-21: 650 mg via ORAL
  Filled 2023-06-19: qty 2

## 2023-06-19 MED ORDER — LIDOCAINE 5 % EX PTCH
1.0000 | MEDICATED_PATCH | CUTANEOUS | Status: DC
  Administered 2023-06-19 – 2023-06-20 (×2): 1 via TRANSDERMAL
  Filled 2023-06-19 (×2): qty 1

## 2023-06-19 MED ORDER — SODIUM CHLORIDE 0.9 % IV SOLN: 1.0000 g | Freq: Once | INTRAVENOUS | Status: DC

## 2023-06-19 MED ORDER — THIAMINE HCL 100 MG/ML IJ SOLN: 100.0000 mg | Freq: Every day | INTRAMUSCULAR | Status: DC

## 2023-06-19 MED ORDER — ALBUTEROL SULFATE (2.5 MG/3ML) 0.083% IN NEBU
2.5000 mg | INHALATION_SOLUTION | Freq: Once | RESPIRATORY_TRACT | Status: AC
  Administered 2023-06-19: 2.5 mg via RESPIRATORY_TRACT

## 2023-06-19 MED ORDER — SODIUM CHLORIDE 0.9 % IV SOLN
2.0000 g | INTRAVENOUS | Status: DC
  Administered 2023-06-20: 2 g via INTRAVENOUS
  Filled 2023-06-19: qty 20

## 2023-06-19 MED ORDER — HYDROCODONE-ACETAMINOPHEN 5-325 MG PO TABS
1.0000 | ORAL_TABLET | ORAL | Status: DC | PRN
  Administered 2023-06-20 – 2023-06-21 (×3): 1 via ORAL
  Filled 2023-06-19 (×3): qty 1

## 2023-06-19 MED ORDER — SODIUM CHLORIDE 0.9 % IV SOLN
1.0000 g | Freq: Once | INTRAVENOUS | Status: AC
  Administered 2023-06-19: 1 g via INTRAVENOUS
  Filled 2023-06-19: qty 10

## 2023-06-19 MED ORDER — SODIUM CHLORIDE 0.9 % IV SOLN: INTRAVENOUS | Status: AC

## 2023-06-19 MED ORDER — ALBUTEROL SULFATE (2.5 MG/3ML) 0.083% IN NEBU: 2.5000 mg | INHALATION_SOLUTION | RESPIRATORY_TRACT | Status: DC | PRN

## 2023-06-19 MED ORDER — MIRTAZAPINE 15 MG PO TABS
15.0000 mg | ORAL_TABLET | Freq: Every day | ORAL | Status: DC
  Administered 2023-06-19 – 2023-06-20 (×2): 15 mg via ORAL
  Filled 2023-06-19 (×2): qty 1

## 2023-06-19 MED ORDER — SODIUM CHLORIDE 0.9 % IV SOLN
100.0000 mg | Freq: Once | INTRAVENOUS | Status: AC
  Administered 2023-06-19: 100 mg via INTRAVENOUS
  Filled 2023-06-19: qty 100

## 2023-06-19 MED ORDER — NICOTINE 14 MG/24HR TD PT24
14.0000 mg | MEDICATED_PATCH | Freq: Every day | TRANSDERMAL | Status: DC
  Administered 2023-06-19 – 2023-06-21 (×3): 14 mg via TRANSDERMAL
  Filled 2023-06-19 (×3): qty 1

## 2023-06-19 MED ORDER — ACETAMINOPHEN 650 MG RE SUPP: 650.0000 mg | Freq: Four times a day (QID) | RECTAL | Status: DC | PRN

## 2023-06-19 MED ORDER — ADULT MULTIVITAMIN W/MINERALS CH
1.0000 | ORAL_TABLET | Freq: Every day | ORAL | Status: DC
  Administered 2023-06-19 – 2023-06-21 (×3): 1 via ORAL
  Filled 2023-06-19 (×3): qty 1

## 2023-06-19 MED ORDER — LORAZEPAM 1 MG PO TABS: 1.0000 mg | ORAL_TABLET | ORAL | Status: DC | PRN

## 2023-06-19 MED ORDER — SODIUM CHLORIDE 0.9% FLUSH
3.0000 mL | Freq: Two times a day (BID) | INTRAVENOUS | Status: DC
  Administered 2023-06-19 – 2023-06-21 (×4): 3 mL via INTRAVENOUS

## 2023-06-19 MED ORDER — THIAMINE MONONITRATE 100 MG PO TABS
100.0000 mg | ORAL_TABLET | Freq: Every day | ORAL | Status: DC
  Administered 2023-06-19 – 2023-06-21 (×3): 100 mg via ORAL
  Filled 2023-06-19 (×3): qty 1

## 2023-06-19 MED ORDER — ENOXAPARIN SODIUM 40 MG/0.4ML IJ SOSY
40.0000 mg | PREFILLED_SYRINGE | INTRAMUSCULAR | Status: DC
  Administered 2023-06-19 – 2023-06-20 (×2): 40 mg via SUBCUTANEOUS
  Filled 2023-06-19 (×2): qty 0.4

## 2023-06-19 MED ORDER — SODIUM CHLORIDE 0.9 % IV SOLN
100.0000 mg | Freq: Two times a day (BID) | INTRAVENOUS | Status: DC
  Administered 2023-06-20 – 2023-06-21 (×3): 100 mg via INTRAVENOUS
  Filled 2023-06-19 (×3): qty 100

## 2023-06-19 MED ORDER — FOLIC ACID 1 MG PO TABS
1.0000 mg | ORAL_TABLET | Freq: Every day | ORAL | Status: DC
  Administered 2023-06-19 – 2023-06-21 (×3): 1 mg via ORAL
  Filled 2023-06-19 (×3): qty 1

## 2023-06-19 MED ORDER — IPRATROPIUM-ALBUTEROL 0.5-2.5 (3) MG/3ML IN SOLN
3.0000 mL | Freq: Four times a day (QID) | RESPIRATORY_TRACT | Status: DC
  Administered 2023-06-19: 3 mL via RESPIRATORY_TRACT
  Filled 2023-06-19: qty 3

## 2023-06-19 MED ORDER — ALBUTEROL SULFATE (2.5 MG/3ML) 0.083% IN NEBU
INHALATION_SOLUTION | RESPIRATORY_TRACT | Status: AC
  Filled 2023-06-19: qty 3

## 2023-06-19 MED ORDER — LORAZEPAM 2 MG/ML IJ SOLN: 1.0000 mg | INTRAMUSCULAR | Status: DC | PRN

## 2023-06-19 NOTE — ED Provider Notes (Signed)
 [facility]    CSN: [id number] Arrival date & time: 06/19/23  4098      History   Chief Complaint Chief Complaint  Patient presents with   Fall    HPI Ryan Ortega is a 78 y.o. male.    Fall  Here for right sided anterior chest pain.  He lost his balance and fell onto a piece of furniture, striking his right upper anterior chest.  This happened about 3 days ago.  He did not hit his head and had no loss of consciousness and no syncope.  He also acknowledges he has a couple of days history of cough and shortness of breath.  Uncertain fever.  He reports he does not have any health problems and does not take any daily medications.  He does smoke tobacco   Past Medical History:  Diagnosis Date   Allergy    Chronic hepatitis C without mention of hepatic coma    GERD (gastroesophageal reflux disease)    H. pylori infection    Hearing loss of aging    Internal hemorrhoids without mention of complication    Lipoma of unspecified site    Loss of weight    Mild cognitive impairment, so stated    Osteoarthrosis, unspecified whether generalized or localized, unspecified site    Pain in joint, forearm    Personal history of colonic adenoma 06/25/2012   Rash and other nonspecific skin eruption    Routine general medical examination at a health care facility    Substance abuse (HCC)    Tinnitus of both ears    Tobacco use disorder    Unspecified visual loss     Patient Active Problem List   Diagnosis Date Noted   Gastroesophageal reflux disease 11/22/2021   Tobacco use disorder, continuous 11/22/2021   Coronary artery disease involving native coronary artery of native heart without angina pectoris 11/22/2021   Mass of hand, left 11/22/2021   Pain due to onychomycosis of toenails of both feet 08/22/2021   Centrilobular emphysema (HCC) 11/22/2020   Acute idiopathic gout of right foot 05/03/2018   Liver fibrosis 07/29/2017   Vaccine counseling 07/29/2017   H.  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 Authorizing Provider  cyanocobalamin (VITAMIN B12) 1000 MCG tablet Take 1 tablet (1,000 mcg total) by mouth daily. 06/02/23   Ngetich, Dinah C, NP  folic acid (FOLVITE) 400 MCG tablet Take 1 tablet (400 mcg total) by mouth daily. 06/02/23   Ngetich, Dinah C, NP  mirtazapine (REMERON) 15 MG tablet TAKE 1 TABLET BY MOUTH EVERYDAY AT BEDTIME 06/02/23   Ngetich, Dinah C, NP  Multiple Vitamin (MULTIVITAMIN) TABS Take 1 tablet by mouth daily. 06/02/23   Ngetich, Donalee Citrin, NP  Family History Family History  Problem Relation Age of Onset   Cancer Mother    Stroke Sister    Breast cancer Sister    Colitis Neg Hx    Esophageal cancer Neg Hx    Stomach cancer Neg Hx    Rectal cancer Neg Hx    Colon cancer Neg Hx     Social History Social History   Tobacco Use   Smoking status: Some Days    Current packs/day: 0.25    Average packs/day: 0.3 packs/day for 40.0 years (10.0 ttl pk-yrs)    Types: Cigarettes   Smokeless tobacco: Never  Vaping Use   Vaping status: Never Used  Substance Use Topics   Alcohol use: Yes    Alcohol/week: 8.0 standard drinks of alcohol    Types: 8 Cans of beer per week    Comment: weekly.   Drug use: Yes    Frequency: 5.0 times per week    Types:  Marijuana    Comment: smokes marijuna "every now and then during the week"     Allergies   Patient has no known allergies.   Review of Systems Review of Systems   Physical Exam Triage Vital Signs ED Triage Vitals  Encounter Vitals Group     BP 06/19/23 1008 (!) 182/77     Systolic BP Percentile --      Diastolic BP Percentile --      Pulse Rate 06/19/23 1008 100     Resp 06/19/23 1008 20     Temp 06/19/23 1008 97.9 F (36.6 C)     Temp Source 06/19/23 1008 Oral     SpO2 06/19/23 1008 (!) 87 %     Weight --      Height --      Head Circumference --      Peak Flow --      Pain Score 06/19/23 1011 10     Pain Loc --      Pain Education --      Exclude from Growth Chart --    No data found.  Updated Vital Signs BP (!) 182/77 (BP Location: Left Arm)   Pulse 100   Temp 97.9 F (36.6 C) (Oral)   Resp 20   SpO2 (!) 87%   Visual Acuity Right Eye Distance:   Left Eye Distance:   Bilateral Distance:    Right Eye Near:   Left Eye Near:    Bilateral Near:     Physical Exam Vitals reviewed.  Constitutional:      General: He is not in acute distress.    Appearance: He is not ill-appearing, toxic-appearing or diaphoretic.  HENT:     Nose: Congestion present.     Mouth/Throat:     Mouth: Mucous membranes are moist.  Eyes:     Extraocular Movements: Extraocular movements intact.     Conjunctiva/sclera: Conjunctivae normal.     Pupils: Pupils are equal, round, and reactive to light.  Cardiovascular:     Rate and Rhythm: Normal rate and regular rhythm.     Heart sounds: No murmur heard. Pulmonary:     Breath sounds: No stridor. No rales.     Comments: There are some low pitched wheezes heard scattered throughout the lung field. Chest:     Chest wall: Tenderness (right upper anterior chest) present.  Musculoskeletal:     Cervical back: Neck supple.  Lymphadenopathy:     Cervical: No cervical adenopathy.  Skin:    Coloration: Skin  is not jaundiced or pale.   Neurological:     Mental Status: He is alert.      UC Treatments / Results  Labs (all labs ordered are listed, but only abnormal results are displayed) Labs Reviewed - No data to display  EKG   Radiology No results found.  Procedures Procedures (including critical care time)  Medications Ordered in UC Medications  albuterol (PROVENTIL) (2.5 MG/3ML) 0.083% nebulizer solution 2.5 mg (2.5 mg Nebulization Given 06/19/23 1032)    Initial Impression / Assessment and Plan / UC Course  I have reviewed the triage vital signs and the nursing notes.  Pertinent labs & imaging results that were available during my care of the patient were reviewed by me and considered in my medical decision making (see chart for details).     On 2 L of oxygen by nasal cannula, his oxygen saturation increases to 96%. Albuterol nebulization treatment was given.  He maybe felt a little better but he continued to have anterior chest pain. Chest x-ray by my review shows a right lower lobe infiltrate.  On room air after the nebulization, his O2 sat drops back down to 85%, about 20 to 30 minutes later.  He will go to the emergency room by private car with his son driving for further treatment and evaluation since he is continuing to be hypoxic on room air.  Final Clinical Impressions(s) / UC Diagnoses   Final diagnoses:  Acute respiratory failure with hypoxia (HCC)  Right-sided chest pain  Pneumonia of right lower lobe due to infectious organism     Discharge Instructions      Patient's oxygen on room air went back down to 85% after albuterol treatment.  There is a pneumonia most likely in the right lower lung.  Please go to the emergency room for further evaluation and treatment.     ED Prescriptions   None    PDMP not reviewed this encounter.   Zenia Resides, MD 06/19/23 [phone]

## 2023-06-19 NOTE — ED Notes (Signed)
 Patient is being discharged from the Urgent Care and sent to the Emergency Department via self . Per Dr. Marlinda Mike, patient is in need of higher level of care due to pneumonia/hypoxia. Patient is aware and verbalizes understanding of plan of care.  Vitals:   06/19/23 1008  BP: (!) 182/77  Pulse: 100  Resp: 20  Temp: 97.9 F (36.6 C)  SpO2: (!) 87%

## 2023-06-19 NOTE — H&P (Addendum)
 History and Physical    Patient: Ryan Ortega XLK:[id number] DOB: 1946/02/28 DOA: 06/19/2023 DOS: the patient was seen and examined on 06/19/2023 PCP: Ngetich, Donalee Citrin, NP  Patient coming from: From urgent care  Chief Complaint:  Chief Complaint  Patient presents with   Chest Pain   HPI: Ryan Ortega is a 78 y.o. male with medical history significant of emphysema/COPD, hepatitis C, tobacco abuse, alcohol use, and GERD who presents with complaints of chest pain after having a fall.  Three days ago, he was walking into his place when he tripped falling into a piece of furniture striking his chest.  Denied any loss of consciousness or significant trauma to his head.  Initially, he did not seek medical attention,  as he was hoping the pain would subside on its own.     Since the fall, he has experienced worsening sharp chest pains and shortness of breath, particularly when  trying to take deep breaths and changing position.  He had taken Tylenol at home, which provided only minimal relief.  Patient reports that he is not on any blood thinners.  He had gone to urgent care and had been sent to the emergency department for further evaluation after O2 saturations were noted to be as low as 87% on room air.  He smokes half a pack of cigarettes daily and consumes alcohol, sharing a six-pack of beer with a friend every day. He also mentions occasional marijuana use.  In the emergency department patient was noted to have temperature up to 99.8 F with pulse 90-128, respirations elevated up to 30, O2 saturations noted to be as low as 86% on room air with improvement on 2 L of nasal cannula oxygen.  Labs noted WBC 10.3, BUN 17, creatinine 174, BNP 111.7,  and high-sensitivity troponin 21.  CT scan of the chest without contrast noted acute displaced lower sternal fracture with surrounding edema, right middle lobe and lower lobe consolidative airspace disease concerning for pneumonia.  Patient was given  empiric antibiotics of Rocephin and Doxycycline.    Review of Systems: As mentioned in the history of present illness. All other systems reviewed and are negative. Past Medical History:  Diagnosis Date   Allergy    Chronic hepatitis C without mention of hepatic coma    GERD (gastroesophageal reflux disease)    H. pylori infection    Hearing loss of aging    Internal hemorrhoids without mention of complication    Lipoma of unspecified site    Loss of weight    Mild cognitive impairment, so stated    Osteoarthrosis, unspecified whether generalized or localized, unspecified site    Pain in joint, forearm    Personal history of colonic adenoma 06/25/2012   Rash and other nonspecific skin eruption    Routine general medical examination at a health care facility    Substance abuse (HCC)    Tinnitus of both ears    Tobacco use disorder    Unspecified visual loss    Past Surgical History:  Procedure Laterality Date   COLONOSCOPY     left shoulder  1990   POLYPECTOMY     Social History:  reports that he has been smoking cigarettes. He has a 10 pack-year smoking history. He has never used smokeless tobacco. He reports current alcohol use of about 8.0 standard drinks of alcohol per week. He reports current drug use. Frequency: 5.00 times per week. Drug: Marijuana.  No Known Allergies  Family History  Problem Relation Age of Onset   Cancer Mother    Stroke Sister    Breast cancer Sister    Colitis Neg Hx    Esophageal cancer Neg Hx    Stomach cancer Neg Hx    Rectal cancer Neg Hx    Colon cancer Neg Hx     Prior to Admission medications   Medication Sig Start Date End Date Taking? Authorizing Provider  cyanocobalamin (VITAMIN B12) 1000 MCG tablet Take 1 tablet (1,000 mcg total) by mouth daily. 06/02/23   Ngetich, Dinah C, NP  folic acid (FOLVITE) 400 MCG tablet Take 1 tablet (400 mcg total) by mouth daily. 06/02/23   Ngetich, Dinah C, NP  mirtazapine (REMERON) 15 MG tablet TAKE 1  TABLET BY MOUTH EVERYDAY AT BEDTIME 06/02/23   Ngetich, Dinah C, NP  Multiple Vitamin (MULTIVITAMIN) TABS Take 1 tablet by mouth daily. 06/02/23   Ngetich, Donalee Citrin, NP    Physical Exam: Vitals:   06/19/23 1300 06/19/23 1315 06/19/23 1500 06/19/23 1534  BP: (!) 139/94 (!) 151/68 (!) 156/110   Pulse: 90 94 (!) 120   Resp: (!) 24 (!) 29 (!) 30   Temp:    99.8 F (37.7 C)  TempSrc:    Oral  SpO2: 92% 94% 95%   Weight:      Height:       Constitutional: Thin elderly male who appears to be in some discomfort sitting up on hospital bed Eyes: PERRL, lids and conjunctivae normal ENMT: Mucous membranes are moist.  Poor dentition Neck: normal, supple Respiratory: Decreased overall aeration with rales appreciated most notably on the mid and lower right lung as well as some intermittent wheezes.  Patient currently on 2 L of nasal cannula oxygen with O2 saturations maintained. Cardiovascular: Regular rate and rhythm, no murmurs / rubs / gallops. No extremity edema. 2+ pedal pulses. No carotid bruits.  Abdomen: no tenderness, no masses palpated. Bowel sounds positive.  Musculoskeletal: no clubbing / cyanosis.  Tenderness palpation of the sternum with swelling present Skin: no rashes, lesions, ulcers. No induration Neurologic: CN 2-12 grossly intact.  Strength 5/5 in all 4.  Psychiatric: Normal judgment and insight. Alert and oriented x 3. Normal mood.   Data Reviewed:   Initial EKG revealed concerns for possible atrial fibrillation at 132 bpm repeat EKG showed sinus tachycardia at 106 bpm.  Reviewed labs, imaging, and pertinent records as documented  Assessment and Plan:  Sternal fracture secondary to fall Prior to arrival.  Patient reports that he had come into his dark house and tripped his chest on a piece of furniture 3 days ago.  CT scan of the chest revealed an acute displaced lower sternal body fracture with surrounding edema. -Admit to a medical telemetry bed -Lidocaine patch to  sternum -Hydrocodone as needed for pain -Physical therapy to evaluate and treat  Acute respiratory failure with hypoxia Community-acquired pneumonia COPD with possible exacerbation Patient reports having productive cough over the last couple days.  He was noted to be hypoxic down to 86% on room air with improvement on 2 L of nasal cannula oxygen CT scan of the chest did also note right middle and lower lobe consolidative airspace disease concerning for pneumonia.  Patient had been started on empiric antibiotics of Rocephin and azithromycin.  Factors leading to pneumonia as well as acute respiratory failure include the sternal fracture and/or COPD -Continuous pulse oximetry with oxygen maintain O2 saturation greater than 90% -Incentive spirometry and flutter valve -Check procalcitonin (0.58) -  Continue empiric antibiotics of Rocephin and doxycycline -Mucinex -DuoNeb breathing treatments  Elevated troponin Abnormal EKG Acute.  High-sensitivity troponins mildly elevated but essentially flat at 18->21.  Suspect possibly secondary to demand due to above. Initial EKG noted concern for atrial fibrillation, but repeat check noted sinus tachycardia.   -Consider need of formal echocardiogram -Follow-up telemetry overnight  Acute kidney injury Creatinine noted to be elevated at 1.74 with a BUN 17.  Baseline creatinine previously noted to be 0.8 when checked on 2/17. -Monitor intake and output -Check urinalysis -Check CK level -Gentle IV fluids -Recheck kidney function in a.m.  Elevated blood pressure reading On admission blood pressures noted to be elevated up to 187/97.  Patient not on blood pressure medications at baseline. -Hydralazine IV as needed for elevated blood pressures -Continue to monitor and determine if need to start blood pressure medicine   Normocytic anemia Chronic. Hemoglobin noted to be 12.2 with elevated RDW.  Similar to prior. -Continue to monitor  History of hepatitis  C -Add on hepatic panel  Alcohol abuse Patient reports splitting sixpack of beer most days.  Denies any history of withdrawals. -CIWA protocols initiated with Ativan as needed -Thiamine, folic acid, MVI  Tobacco abuse Patient reports currently smoking half a pack of cigarettes per day on average. -Nicotine patch offered -Continue to counsel on need of cessation of tobacco use   DVT prophylaxis: Lovenox Advance Care Planning:   Code Status: Full Code    Consults: None  Family Communication: None  Severity of Illness: The appropriate patient status for this patient is INPATIENT. Inpatient status is judged to be reasonable and necessary in order to provide the required intensity of service to ensure the patient's safety. The patient's presenting symptoms, physical exam findings, and initial radiographic and laboratory data in the context of their chronic comorbidities is felt to place them at high risk for further clinical deterioration. Furthermore, it is not anticipated that the patient will be medically stable for discharge from the hospital within 2 midnights of admission.   * I certify that at the point of admission it is my clinical judgment that the patient will require inpatient hospital care spanning beyond 2 midnights from the point of admission due to high intensity of service, high risk for further deterioration and high frequency of surveillance required.*  Author: Clydie Braun, MD 06/19/2023 4:35 PM  For on call review [email or web address].

## 2023-06-19 NOTE — ED Notes (Signed)
 Went to [facility]

## 2023-06-19 NOTE — ED Triage Notes (Addendum)
 Pt sent from urgent care for further evaluation of generalized cp; mechanical fall Tuesday, no LOC; did not hit head, not on thinners, was not evaluated after fall; says cp is worse with standing, endorses some constant sob

## 2023-06-19 NOTE — ED Provider Notes (Signed)
 78 year old male presents the emergency department today after a fall with shortness of breath and concern for pneumonia on outpatient x-ray.  Physical Exam  BP (!) 156/110   Pulse (!) 120   Temp 99.8 F (37.7 C) (Oral)   Resp (!) 30   Ht 5\' 6"  (1.676 m)   Wt 61.2 kg   SpO2 95%   BMI 21.79 kg/m   Physical Exam General: No acute distress Respiratory: Coarse breath sounds right lower lung field  Procedures  Procedures  ED Course / MDM   Clinical Course as of 06/19/23 1610  Thu Jun 19, 2023  1555 Pt signed out to Dr Rhae Hammock EDP - antibiotics ordered for CAP per CT imaging (RLL infiltrate) [MT]    Clinical Course User Index [MT] Trifan, Kermit Balo, MD   Medical Decision Making Amount and/or Complexity of Data Reviewed Labs: ordered. Radiology: ordered.   The patient CT scan shows sternal fracture and infiltrate.  The patient has been placed on oxygen for some desaturations.  He is currently stable on 3 L nasal cannula in the low 90s.  Calls placed to hospitalist service for admission.  He has been covered with antibiotics.       Durwin Glaze, MD 06/19/23 [phone]

## 2023-06-19 NOTE — ED Triage Notes (Signed)
 Patient here today with c/o chest wall pain after falling on Tuesday twice. Denies any trouble breathing but has sharp pain when he goes to stand up.

## 2023-06-19 NOTE — ED Notes (Signed)
 Went to CT

## 2023-06-19 NOTE — ED Provider Notes (Signed)
 [place] EMERGENCY DEPARTMENT AT [facility] Provider Note   CSN: [id number] Arrival date & time: 06/19/23  1123     History  Chief Complaint  Patient presents with   Chest Pain    Ryan Ortega is a 78 y.o. male presenting to ED with SOB after falling 1 week ago and striking his chest on furniture.  +Cough.  No fevers, but chills at home.  Chest pain is midsternal worse with breathing and movement.  Xray at [facility] today of the chest concerning for RLLL infiltrate  HPI     Home Medications Prior to Admission medications   Medication Sig Start Date End Date Taking? Authorizing Provider  cyanocobalamin (VITAMIN B12) 1000 MCG tablet Take 1 tablet (1,000 mcg total) by mouth daily. 06/02/23   Ngetich, Dinah C, NP  folic acid (FOLVITE) 400 MCG tablet Take 1 tablet (400 mcg total) by mouth daily. 06/02/23   Ngetich, Dinah C, NP  mirtazapine (REMERON) 15 MG tablet TAKE 1 TABLET BY MOUTH EVERYDAY AT BEDTIME 06/02/23   Ngetich, Dinah C, NP  Multiple Vitamin (MULTIVITAMIN) TABS Take 1 tablet by mouth daily. 06/02/23   Ngetich, Donalee Citrin, NP      Allergies    Patient has no known allergies.    Review of Systems   Review of Systems  Physical Exam Updated Vital Signs BP (!) 187/97   Pulse (!) 128   Temp 97.9 F (36.6 C) (Axillary)   Resp (!) 22   Ht 5\' 6"  (1.676 m)   Wt 61.2 kg   SpO2 96%   BMI 21.79 kg/m  Physical Exam Constitutional:      General: He is not in acute distress. HENT:     Head: Normocephalic and atraumatic.  Eyes:     Conjunctiva/sclera: Conjunctivae normal.     Pupils: Pupils are equal, round, and reactive to light.  Cardiovascular:     Rate and Rhythm: Normal rate and regular rhythm.  Pulmonary:     Effort: Pulmonary effort is normal. No respiratory distress.     Comments: 90% on room air, 95% on 2L [place] Abdominal:     General: There is no distension.     Tenderness: There is no abdominal tenderness.  Musculoskeletal:     Comments: Midline  sternal ttp  Skin:    General: Skin is warm and dry.  Neurological:     General: No focal deficit present.     Mental Status: He is alert. Mental status is at baseline.  Psychiatric:        Mood and Affect: Mood normal.        Behavior: Behavior normal.     ED Results / Procedures / Treatments   Labs (all labs ordered are listed, but only abnormal results are displayed) Labs Reviewed  BASIC METABOLIC PANEL  CBC  BRAIN NATRIURETIC PEPTIDE  TROPONIN I (HIGH SENSITIVITY)    EKG EKG Interpretation Date/Time:  Thursday June 19 2023 12:07:38 EST Ventricular Rate:  106 PR Interval:  143 QRS Duration:  83 QT Interval:  333 QTC Calculation: 443 R Axis:   71  Text Interpretation: Sinus tachycardia Borderline repolarization abnormality Confirmed by Alvester Chou [phone]) on 06/19/2023 12:17:30 PM  Radiology DG Chest 2 View Result Date: 06/19/2023 CLINICAL DATA:  Shortness of breath, hypoxia, fall, chest pain EXAM: CHEST - 2 VIEW COMPARISON:  01/29/2017 FINDINGS: Heart and mediastinal contours within normal limits. Aortic atherosclerosis. Left lung clear. Right basilar airspace opacity. No effusions or pneumothorax. No  acute bony abnormality. No visualized displaced rib fracture. IMPRESSION: Right basilar airspace opacity could reflect atelectasis or pneumonia. Electronically Signed   By: Charlett Nose M.D.   On: 06/19/2023 11:53    Procedures Procedures    Medications Ordered in ED Medications - No data to display  ED Course/ Medical Decision Making/ A&P Clinical Course as of 06/19/23 1637  Thu Jun 19, 2023  1555 Pt signed out to Dr Rhae Hammock EDP - antibiotics ordered for CAP per CT imaging (RLL infiltrate) [MT]    Clinical Course User Index [MT] Renaye Rakers Kermit Balo, MD                                 Medical Decision Making Amount and/or Complexity of Data Reviewed Labs: ordered. Radiology: ordered.  Risk Decision regarding hospitalization.   This patient presents to the  ED with concern for chest pain, SOB. This involves an extensive number of treatment options, and is a complaint that carries with it a high risk of complications and morbidity.  The differential diagnosis includes rib or sternal fx vs pleural effusion vs PNA vs viral URI vs other  Additional history obtained from wife  External records from outside source obtained and reviewed including UC evaluation; dg chest from UC reviewed -concern for likely RLL infiltrate; difficult to determine rib fx  I ordered and personally interpreted labs.  The pertinent results include:  WBC wnl.  Trop 21.  BNP 11.  I ordered CT chest to evaluate for rib vs sternal fx and underlying PNA - CT pending at the time of signout  The patient was maintained on a cardiac monitor.  I personally viewed and interpreted the cardiac monitored which showed an underlying rhythm of: sinus tachycardia  Per my interpretation the patient's ECG shows initially A Fib with RVR, subsequently Sinus tachycardia  I ordered medication including IV antibiotics for PNA  I have reviewed the patients home medicines and have made adjustments as needed  Test Considered: doubt acute PE   After the interventions noted above, I reevaluated the patient and found that they have: stayed the same   Dispostion:  Patient signed out to Dr Rhae Hammock at 3:45 pm pending follow up on CT imaging regarding possible chest wall fracture and underlying infection.  Low threshold for medical admission given age, borderline hypoxia.          Final Clinical Impression(s) / ED Diagnoses Final diagnoses:  None    Rx / DC Orders ED Discharge Orders     None         Tyniah Kastens, Kermit Balo, MD 06/19/23 [id number]

## 2023-06-19 NOTE — Plan of Care (Signed)
   Problem: Education: Goal: Knowledge of General Education information will improve Description: Including pain rating scale, medication(s)/side effects and non-pharmacologic comfort measures Outcome: Progressing   Problem: Activity: Goal: Risk for activity intolerance will decrease Outcome: Progressing   Problem: Nutrition: Goal: Adequate nutrition will be maintained Outcome: Progressing

## 2023-06-19 NOTE — Discharge Instructions (Addendum)
 Patient's oxygen on room air went back down to 85% after albuterol treatment.  There is a pneumonia most likely in the right lower lung.  Please go to the emergency room for further evaluation and treatment.

## 2023-06-20 ENCOUNTER — Inpatient Hospital Stay (HOSPITAL_COMMUNITY)

## 2023-06-20 DIAGNOSIS — R079 Chest pain, unspecified: Secondary | ICD-10-CM

## 2023-06-20 DIAGNOSIS — S2220XA Unspecified fracture of sternum, initial encounter for closed fracture: Secondary | ICD-10-CM | POA: Diagnosis not present

## 2023-06-20 LAB — CBC
HCT: 28.2 % — ABNORMAL LOW (ref 39.0–52.0)
Hemoglobin: 9.3 g/dL — ABNORMAL LOW (ref 13.0–17.0)
MCH: 27.8 pg (ref 26.0–34.0)
MCHC: 33 g/dL (ref 30.0–36.0)
MCV: 84.4 fL (ref 80.0–100.0)
Platelets: 190 10*3/uL (ref 150–400)
RBC: 3.34 MIL/uL — ABNORMAL LOW (ref 4.22–5.81)
RDW: 16.1 % — ABNORMAL HIGH (ref 11.5–15.5)
WBC: 7.2 10*3/uL (ref 4.0–10.5)
nRBC: 0 % (ref 0.0–0.2)

## 2023-06-20 LAB — ECHOCARDIOGRAM COMPLETE
AR max vel: 2.39 cm2
AV Area VTI: 2.24 cm2
AV Area mean vel: 2.13 cm2
AV Mean grad: 2 mmHg
AV Peak grad: 3.5 mmHg
Ao pk vel: 0.94 m/s
Area-P 1/2: 5.42 cm2
Height: 66 in
S' Lateral: 2.6 cm
Weight: 2160 [oz_av]

## 2023-06-20 LAB — BASIC METABOLIC PANEL
Anion gap: 6 (ref 5–15)
BUN: 19 mg/dL (ref 8–23)
CO2: 23 mmol/L (ref 22–32)
Calcium: 8.2 mg/dL — ABNORMAL LOW (ref 8.9–10.3)
Chloride: 106 mmol/L (ref 98–111)
Creatinine, Ser: 1.14 mg/dL (ref 0.61–1.24)
GFR, Estimated: >60 mL/min (ref >60)
Glucose, Bld: 94 mg/dL (ref 70–99)
Potassium: 3.4 mmol/L — ABNORMAL LOW (ref 3.5–5.1)
Sodium: 135 mmol/L (ref 135–145)

## 2023-06-20 LAB — HEPATITIS PANEL, ACUTE
HCV Ab: REACTIVE — AB
Hep A IgM: NONREACTIVE
Hep B C IgM: NONREACTIVE
Hepatitis B Surface Ag: NONREACTIVE

## 2023-06-20 LAB — PHOSPHORUS: Phosphorus: 2.8 mg/dL (ref 2.5–4.6)

## 2023-06-20 LAB — MAGNESIUM: Magnesium: 1.8 mg/dL (ref 1.7–2.4)

## 2023-06-20 MED ORDER — IPRATROPIUM-ALBUTEROL 0.5-2.5 (3) MG/3ML IN SOLN
3.0000 mL | Freq: Three times a day (TID) | RESPIRATORY_TRACT | Status: DC
  Administered 2023-06-20: 3 mL via RESPIRATORY_TRACT
  Filled 2023-06-20: qty 3

## 2023-06-20 MED ORDER — POTASSIUM CHLORIDE CRYS ER 20 MEQ PO TBCR
40.0000 meq | EXTENDED_RELEASE_TABLET | Freq: Once | ORAL | Status: AC
  Administered 2023-06-20: 40 meq via ORAL
  Filled 2023-06-20: qty 2

## 2023-06-20 MED ORDER — PERFLUTREN LIPID MICROSPHERE
1.0000 mL | INTRAVENOUS | Status: AC | PRN
  Administered 2023-06-20: 3 mL via INTRAVENOUS

## 2023-06-20 NOTE — Progress Notes (Signed)
   06/20/23 1328  OTHER  Substance Abuse Education Offered No (Patient states he does not drink ETOH or use drugs)  (CAGE-AID) Substance Abuse Screening Tool  Have You Ever Felt You Ought to Cut Down on Your Drinking or Drug Use? 0  Have People Annoyed You By Critizing Your Drinking Or Drug Use? 0  Have You Felt Bad Or Guilty About Your Drinking Or Drug Use? 0  Have You Ever Had a Drink or Used Drugs First Thing In The Morning to Steady Your Nerves or to Get Rid of a Hangover? 0  CAGE-AID Score 0   Patient states he does not drink ETOH or use drugs

## 2023-06-20 NOTE — Progress Notes (Signed)
  Echocardiogram 2D Echocardiogram has been performed.  Ryan Ortega 06/20/2023, 11:50 AM

## 2023-06-20 NOTE — TOC Initial Note (Addendum)
 Transition of Care (TOC) - Initial/Assessment Note   Patient from home with wife.   PT recommending rolling walker and HHPT.  Secure chat MD for orders.   Patient has no preference . Await call back from [place] with [place]. Kandee Keen with [facility] accepted   Ordered walker with Vaughan Basta with [employer]   Patient Details  Name: Ryan Ortega MRN: [id number] Date of Birth: [birth year]  Transition of Care [facility] [place] [facility]) CM/SW Contact:    Kingsley Plan, RN Phone Number: 06/20/2023, 1:19 PM  Clinical Narrative:                   Expected Discharge Plan: Home w Home Health Services Barriers to Discharge: Continued Medical Work up   Patient Goals and CMS Choice Patient states their goals for this hospitalization and ongoing recovery are:: to return to home CMS Medicare.gov Compare Post Acute Care list provided to:: Patient Choice offered to / list presented to : Patient      Expected Discharge Plan and Services   Discharge Planning Services: CM Consult Post Acute Care Choice: Home Health, Durable Medical Equipment Living arrangements for the past 2 months: Single Family Home                 DME Arranged: Walker rolling DME Agency: [employer] Date DME Agency Contacted: 06/20/23 Time DME Agency Contacted: 1318 Representative spoke with at DME Agency: Vaughan Basta HH Arranged: PT HH Agency:  (left Kandee Keen with [facility] a message) Date HH Agency Contacted: 06/20/23 Time HH Agency Contacted: 1318 Representative spoke with at [facility] Agency: Kandee Keen await call back  Prior Living Arrangements/Services Living arrangements for the past 2 months: Single Family Home Lives with:: Spouse Patient language and need for interpreter reviewed:: Yes Do you feel safe going back to the place where you live?: Yes      Need for Family Participation in Patient Care: Yes (Comment) Care giver support system in place?: Yes (comment)   Criminal Activity/Legal Involvement Pertinent to Current Situation/Hospitalization:  No - Comment as needed  Activities of Daily Living   ADL Screening (condition at time of admission) Independently performs ADLs?: Yes (appropriate for developmental age) Is the patient deaf or have difficulty hearing?: No Does the patient have difficulty seeing, even when wearing glasses/contacts?: No Does the patient have difficulty concentrating, remembering, or making decisions?: No  Permission Sought/Granted   Permission granted to share information with : Yes, Verbal Permission Granted     Permission granted to share info w AGENCY: [employer] and [employer]        Emotional Assessment Appearance:: Appears stated age Attitude/Demeanor/Rapport: Engaged Affect (typically observed): Accepting Orientation: : Oriented to Self, Oriented to Place, Oriented to  Time, Oriented to Situation Alcohol / Substance Use: Not Applicable Psych Involvement: No (comment)  Admission diagnosis:  Acute respiratory failure with hypoxia (HCC) [J96.01] Pneumonia due to infectious organism, unspecified laterality, unspecified part of lung [J18.9] Closed fracture of sternum, unspecified portion of sternum, initial encounter [S22.20XA] Patient Active Problem List   Diagnosis Date Noted   Acute respiratory failure with hypoxia (HCC) 06/19/2023   Sternal fracture 06/19/2023   Fall at home, initial encounter 06/19/2023   COPD (chronic obstructive pulmonary disease) (HCC) 06/19/2023   CAP (community acquired pneumonia) 06/19/2023   Elevated troponin 06/19/2023   Abnormal EKG 06/19/2023   AKI (acute kidney injury) (HCC) 06/19/2023   Elevated blood pressure reading 06/19/2023   Normocytic anemia 06/19/2023   Gastroesophageal reflux disease 11/22/2021   Tobacco use disorder, continuous  11/22/2021   Coronary artery disease involving native coronary artery of native heart without angina pectoris 11/22/2021   Mass of hand, left 11/22/2021   Pain due to onychomycosis of toenails of both feet 08/22/2021    Centrilobular emphysema (HCC) 11/22/2020   Acute idiopathic gout of right foot 05/03/2018   Liver fibrosis 07/29/2017   Vaccine counseling 07/29/2017   H. 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 [place] [address] Phone: [phone] Fax: [phone]  [facility] [address], [place] - [address] [place] [address] Phone: [phone] Fax: [phone]  [facility] - [facility] [address] [place] [address] Phone: [phone] Fax: [phone]     Social Drivers of Health (SDOH) Social History: SDOH Screenings   Food Insecurity: Patient Declined (06/19/2023)  Housing: Unknown (06/19/2023)  Transportation Needs: Patient Declined (06/19/2023)  Utilities: Patient Declined (06/19/2023)  Alcohol Screen: Low Risk  (01/22/2018)  Depression (PHQ2-9): Low Risk  (06/02/2023)  Financial Resource Strain: Low Risk  (04/14/2017)  Physical Activity: Inactive (04/14/2017)  Social Connections: Unknown (06/19/2023)  Stress: No Stress Concern Present (04/14/2017)  Tobacco Use: High Risk (06/19/2023)   SDOH Interventions:     Readmission Risk Interventions     No data to  display

## 2023-06-20 NOTE — Hospital Course (Signed)
 Ryan Ortega is a 78 y.o. male with medical history significant of emphysema/COPD, hepatitis C, tobacco abuse, alcohol use, and GERD to the hospital with complaints of chest pain after having a fall.  He continued to have sharp chest pain and shortness of breath especially on trying to take deep breath.  Had gone to urgent care center when he was noted to have hypoxia with pulse ox of 87% on room air.  Patient also with history of smoking and alcohol consumption daily.  In the ED patient was noted to be slightly febrile with a temperature of 99.8 tachycardic tachypneic and pulse ox was 86% on room air which improved with 2 L of nasal cannula oxygen.  WBC of 17.3.  Creatinine of 1.7.  Troponin initial was 21.  CT scan of the chest without contrast showed displaced lower sternal fracture with consolidation of the right middle and lower lobe.  Patient was given empiric antibiotics and was admitted to hospital for further evaluation and treatment.    Assessment and Plan:   Sternal fracture secondary to fall  CT scan of the chest revealed an acute displaced lower sternal body fracture with surrounding edema.  Supportive care including analgesics Lidoderm patch physical therapy evaluation.  Continue flutter valve, incentive spirometry, deep breathing.   Acute respiratory failure with hypoxia Community-acquired pneumonia COPD with possible exacerbation Was hypoxic on presentation.  CT scan of the chest did also note right middle and lower lobe consolidative airspace disease concerning for pneumonia, currently on Rocephin and doxycycline continue incentive spirometry and flutter valve. Procalcitonin (0.58).  Continue Mucinex and DuoNebs.  Has improved at this time.  Currently on room air.  Mild hypokalemia.  Will replace orally.  Check levels in AM.  Magnesium of 1.8.   Elevated troponin Abnormal EKG Acute.  High-sensitivity troponins mildly elevated but essentially flat at 18->21.  Secondary to trauma an  demand ischemia.  Check 2D echocardiogram   acute kidney injury Improved with hydration.  Initial creatinine of 1.7.  Has improved to 1.1.   Elevated blood pressure reading On presentation blood pressure as high as 187/97.  No history of hypertension.  Blood pressure has improved at this time.  Continue on pain management.   Normocytic anemia Chronic.  Hemoglobin today at 9.3.  Initial hemoglobin noted to be 12.2 with elevated RDW.    History of hepatitis C An acute hepatitis panel   Alcohol abuse Continue CIWA protocol.  Continue thiamine folic acid multivitamin.  No withdrawals.  Tobacco abuse Continue nicotine patch.  Counseled against smoking.

## 2023-06-20 NOTE — Evaluation (Signed)
 Physical Therapy Evaluation Patient Details Name: Ryan Ortega MRN: [id number] DOB: March 23, 1946 Today's Date: 06/20/2023  History of Present Illness  Pt is a 78 y/o M admitted on 06/19/23 after presenting with c/o chest pain following a fall 3 days prior. Chest CT showed acute displaced lower sternal fracture with surrounding edema, right middle lobe and lower lobe consolidative airspace disease concerning for pneumonia. PMH: emphysema, COPD, Hep C, tobacco abuse, alcohol use, GERD, hearing loss, mild cognitive impairment  Clinical Impression  Pt seen for PT evaluation with pt agreeable to tx. Pt reports prior to admission & fall he was independent without AD, living with his wife in a 1 level home with 2 steps without rails to enter. Since pt's fall he has been ambulating with a walking stick. On this date, pt requires cuing to use BUE to push to stand before he's able to power to standing with extra time. Provided pt with RW for balance & pt ambulates around unit with RW & supervision with cuing re: positioning in relation to RW. Pt would benefit from ongoing PT services to address balance, gait with LRAD, & stair negotiation.        If plan is discharge home, recommend the following: A little help with bathing/dressing/bathroom;Assistance with cooking/housework;A little help with walking and/or transfers;Assist for transportation;Help with stairs or ramp for entrance   Can travel by private vehicle        Equipment Recommendations Rolling walker (2 wheels)  Recommendations for Other Services       Functional Status Assessment Patient has had a recent decline in their functional status and demonstrates the ability to make significant improvements in function in a reasonable and predictable amount of time.     Precautions / Restrictions Precautions Precautions: Fall Restrictions Weight Bearing Restrictions Per Provider Order: No      Mobility  Bed Mobility                General bed mobility comments: not tested, pt received sitting EOB, left sitting in recliner    Transfers Overall transfer level: Needs assistance Equipment used: None               General transfer comment: STS from EOB x 2 with cuing to push to stand with BUE as pt unable to transfer to standing when only pushing to stand with 1UE, pt able to transfer STS with supervision & extra time to power up    Ambulation/Gait Ambulation/Gait assistance: Supervision Gait Distance (Feet): 150 Feet Assistive device: Rolling walker (2 wheels) Gait Pattern/deviations: Decreased step length - right, Decreased step length - left, Decreased stride length, Trunk flexed Gait velocity: decreased     General Gait Details: PT provides education re: need to ambulate within base of AD vs pushing RW out in front of him with good return Oceanographer     Tilt Bed    Modified Rankin (Stroke Patients Only)       Balance Overall balance assessment: Needs assistance Sitting-balance support: Feet supported Sitting balance-Leahy Scale: Good     Standing balance support: During functional activity, Bilateral upper extremity supported, Reliant on assistive device for balance Standing balance-Leahy Scale: Fair                               Pertinent Vitals/Pain Pain Assessment Pain Assessment: Faces Faces Pain Scale:  Hurts little more Pain Location: chest Pain Descriptors / Indicators: Discomfort Pain Intervention(s): Monitored during session    Home Living Family/patient expects to be discharged to:: Private residence Living Arrangements: Spouse/significant other Available Help at Discharge: Family Type of Home: House Home Access: Stairs to enter Entrance Stairs-Rails: None Secretary/administrator of Steps: 2   Home Layout: One level Home Equipment: Other (comment) (walking stick)      Prior Function Prior Level of Function :  Driving             Mobility Comments: reports he drives a little, ambulatory without AD until he fell then started using a walking stick, pt notes 1-2 falls in the past 6 months ADLs Comments: pt reports independence with bathing & dressing     Extremity/Trunk Assessment   Upper Extremity Assessment Upper Extremity Assessment: Overall WFL for tasks assessed    Lower Extremity Assessment Lower Extremity Assessment: Generalized weakness    Cervical / Trunk Assessment Cervical / Trunk Assessment: Kyphotic  Communication   Communication Communication: Impaired Factors Affecting Communication: Reduced clarity of speech    Cognition Arousal: Alert Behavior During Therapy: WFL for tasks assessed/performed   PT - Cognitive impairments: No family/caregiver present to determine baseline, Problem solving, Safety/Judgement                         Following commands:  (pt follows simple commands during session)       Cueing Cueing Techniques: Verbal cues     General Comments General comments (skin integrity, edema, etc.): SPO2 intermittently checked during session, >/= 90% on room air, no c/o SOB, max HR 118 bpm during gait    Exercises Other Exercises Other Exercises: Reviewed use of incentive spirometer with pt, pt return demonstrates with fair understanding   Assessment/Plan    PT Assessment Patient needs continued PT services  PT Problem List Decreased strength;Cardiopulmonary status limiting activity;Pain;Decreased range of motion;Decreased activity tolerance;Decreased balance;Decreased mobility;Decreased knowledge of use of DME;Decreased safety awareness       PT Treatment Interventions DME instruction;Balance training;Gait training;Neuromuscular re-education;Stair training;Functional mobility training;Therapeutic activities;Therapeutic exercise;Patient/family education;Modalities    PT Goals (Current goals can be found in the Care Plan section)  Acute  Rehab PT Goals Patient Stated Goal: none stated PT Goal Formulation: With patient Time For Goal Achievement: 07/04/23 Potential to Achieve Goals: Good    Frequency Min 2X/week     Co-evaluation               AM-PAC PT "6 Clicks" Mobility  Outcome Measure Help needed turning from your back to your side while in a flat bed without using bedrails?: None Help needed moving from lying on your back to sitting on the side of a flat bed without using bedrails?: A Little Help needed moving to and from a bed to a chair (including a wheelchair)?: A Little Help needed standing up from a chair using your arms (e.g., wheelchair or bedside chair)?: A Little Help needed to walk in hospital room?: A Little Help needed climbing 3-5 steps with a railing? : A Little 6 Click Score: 19    End of Session   Activity Tolerance: Patient tolerated treatment well Patient left: in chair;with chair alarm set;with call bell/phone within reach Nurse Communication: Mobility status PT Visit Diagnosis: Muscle weakness (generalized) (M62.81);Pain;Other abnormalities of gait and mobility (R26.89);Difficulty in walking, not elsewhere classified (R26.2) Pain - part of body:  (sternal)    Time: 1610-9604 PT Time Calculation (  min) (ACUTE ONLY): 23 min   Charges:   PT Evaluation $PT Eval Low Complexity: 1 Low   PT General Charges $$ ACUTE PT VISIT: 1 Visit         Aleda Grana, PT, DPT 06/20/23, 11:01 AM   Sandi Mariscal 06/20/2023, 11:00 AM

## 2023-06-20 NOTE — Progress Notes (Signed)
 Transition of Care [facility]) - CAGE-AID Screening   Patient Details  Name: Ryan Ortega MRN: [id number] Date of Birth: 04/04/1946   Hewitt Shorts, RN Trauma Response Nurse Phone Number: [phone] 06/20/2023, 5:19 PM   CAGE-AID Screening:    Have You Ever Felt You Ought to Cut Down on Your Drinking or Drug Use?: No Have People Annoyed You By Critizing Your Drinking Or Drug Use?: No Have You Felt Bad Or Guilty About Your Drinking Or Drug Use?: No Have You Ever Had a Drink or Used Drugs First Thing In The Morning to Steady Your Nerves or to Get Rid of a Hangover?: No CAGE-AID Score: 0  Substance Abuse Education Offered: (S) No (Denies drinking ETOH- no services needed at this time)

## 2023-06-20 NOTE — Plan of Care (Signed)
  Problem: Nutrition: Goal: Adequate nutrition will be maintained Outcome: Progressing   Problem: Coping: Goal: Level of anxiety will decrease Outcome: Progressing   Problem: Elimination: Goal: Will not experience complications related to bowel motility Outcome: Progressing   Problem: Pain Managment: Goal: General experience of comfort will improve and/or be controlled Outcome: Progressing   Problem: Safety: Goal: Ability to remain free from injury will improve Outcome: Progressing

## 2023-06-20 NOTE — Progress Notes (Signed)
 Mobility Specialist Progress Note:   06/20/23 1600  Mobility  Activity Ambulated with assistance in hallway  Level of Assistance Contact guard assist, steadying assist  Assistive Device Front wheel walker  Distance Ambulated (ft) 175 ft  Activity Response Tolerated well  Mobility Referral Yes  Mobility visit 1 Mobility  Mobility Specialist Start Time (ACUTE ONLY) 1435  Mobility Specialist Stop Time (ACUTE ONLY) 1455  Mobility Specialist Time Calculation (min) (ACUTE ONLY) 20 min   During Mobility: 92 HR , 92% SpO2 RA  Pt received in bed, agreeable to mobility. CG to stand. Ming during ambulation. Pt displayed knee buckling when ambulating in room requiring chair follow for safety. No unsteadiness or LOB present during hallway ambulation. C/o chest pain during session, otherwise asx throughout. VSS. Pt returned to bed with call bell in reach and all needs met.    Leory Plowman  Mobility Specialist Please contact via [employer] office at [phone]

## 2023-06-20 NOTE — Progress Notes (Signed)
 PROGRESS NOTE  Ryan Ortega UJW:[id number] DOB: 02-11-1946 DOA: 06/19/2023 PCP: Caesar Bookman, NP   LOS: 1 day   Brief narrative:  Ryan Ortega is a 78 y.o. male with medical history significant of emphysema/COPD, hepatitis C, tobacco abuse, alcohol use, and GERD to the hospital with complaints of chest pain after having a fall.  He continued to have sharp chest pain and shortness of breath especially on trying to take deep breath.  Had gone to urgent care center when he was noted to have hypoxia with pulse ox of 87% on room air.  Patient also with history of smoking and alcohol consumption daily.  In the ED patient was noted to be slightly febrile with a temperature of 99.8 tachycardic tachypneic and pulse ox was 86% on room air which improved with 2 L of nasal cannula oxygen.  WBC of 17.3.  Creatinine of 1.7.  Troponin initial was 21.  CT scan of the chest without contrast showed displaced lower sternal fracture with consolidation of the right middle and lower lobe.  Patient was given empiric antibiotics and was admitted to hospital for further evaluation and treatment.    Assessment/Plan: Principal Problem:   Sternal fracture Active Problems:   Fall at home, initial encounter   Acute respiratory failure with hypoxia (HCC)   COPD (chronic obstructive pulmonary disease) (HCC)   CAP (community acquired pneumonia)   Elevated troponin   Abnormal EKG   AKI (acute kidney injury) (HCC)   Elevated blood pressure reading   Normocytic anemia   Chronic hepatitis C without hepatic coma (HCC)   Alcohol abuse   Smoking 1/2 pack a day or less  Sternal fracture secondary to fall  CT scan of the chest revealed an acute displaced lower sternal body fracture with surrounding edema.  Supportive care including analgesics Lidoderm patch physical therapy evaluation.  Continue flutter valve, incentive spirometry, deep breathing.  Patient still complains of pain in the chest wall.   Acute respiratory  failure with hypoxia Community-acquired pneumonia COPD with possible exacerbation Was hypoxic on presentation.  CT scan of the chest did also note right middle and lower lobe consolidative airspace disease concerning for pneumonia, currently on Rocephin and doxycycline continue incentive spirometry and flutter valve. Procalcitonin (0.58).  Continue Mucinex and DuoNebs.  Has improved at this time.  Currently on room air.  Temperature max of 99.8 F.  No leukocytosis at this time.  Mild hypokalemia.  Will replace orally.  Check levels in AM.  Magnesium of 1.8.   Elevated troponin Abnormal EKG Acute.  High-sensitivity troponins mildly elevated but essentially flat at 18->21.  Secondary to trauma an demand ischemia.  Check 2D echocardiogram   acute kidney injury Improved with hydration.  Initial creatinine of 1.7.  Has improved to 1.1.  Check BMP in AM.   Elevated blood pressure reading On presentation blood pressure as high as 187/97.  No history of hypertension.  Blood pressure has improved at this time.  Continue on pain management.   Normocytic anemia Chronic.  Hemoglobin today at 9.3.  Initial hemoglobin noted to be 12.2 with elevated RDW.    History of hepatitis C An acute hepatitis panel   Alcohol abuse Continue CIWA protocol.  Continue thiamine folic acid multivitamin.  No withdrawals.  Tobacco abuse Continue nicotine patch.  Counseled against smoking.  DVT prophylaxis: enoxaparin (LOVENOX) injection 40 mg Start: 06/19/23 1645   Disposition: Likely home in 1 to 2 days.  Will get PT evaluation.  Status  is: Inpatient Remains inpatient appropriate because: IV antibiotic, pending clinical improvement, chest pain, PT evaluation    Code Status:     Code Status: Full Code  Family Communication: None at bedside.  Consultants: None  Procedures: None  Anti-infectives:  Rocephin and doxycycline  Anti-infectives (From admission, onward)    Start     Dose/Rate Route  Frequency Ordered Stop   06/20/23 1400  cefTRIAXone (ROCEPHIN) 2 g in sodium chloride 0.9 % 100 mL IVPB        2 g 200 mL/hr over 30 Minutes Intravenous Every 24 hours 06/19/23 1644     06/20/23 0400  doxycycline (VIBRAMYCIN) 100 mg in sodium chloride 0.9 % 250 mL IVPB        100 mg 125 mL/hr over 120 Minutes Intravenous Every 12 hours 06/19/23 1644     06/19/23 1645  cefTRIAXone (ROCEPHIN) 1 g in sodium chloride 0.9 % 100 mL IVPB  Status:  Discontinued        1 g 200 mL/hr over 30 Minutes Intravenous  Once 06/19/23 1637 06/19/23 1641   06/19/23 1600  cefTRIAXone (ROCEPHIN) 1 g in sodium chloride 0.9 % 100 mL IVPB        1 g 200 mL/hr over 30 Minutes Intravenous  Once 06/19/23 1555 06/19/23 1647   06/19/23 1600  doxycycline (VIBRAMYCIN) 100 mg in sodium chloride 0.9 % 250 mL IVPB        100 mg 125 mL/hr over 120 Minutes Intravenous  Once 06/19/23 1555 06/19/23 1850        Subjective: Today, patient was seen and examined at bedside.  Complains of chest discomfort.  Has some cough and mild shortness of breath but no dyspnea.  Was able to eat okay.  No trouble in his stomach.  Objective: Vitals:   06/20/23 0832 06/20/23 0943  BP:  (!) 139/91  Pulse: 89 91  Resp: 18 17  Temp:  98 F (36.7 C)  SpO2: 92% (!) 85%    Intake/Output Summary (Last 24 hours) at 06/20/2023 0956 Last data filed at 06/20/2023 0407 Gross per 24 hour  Intake 632.1 ml  Output --  Net 632.1 ml   Filed Weights   06/19/23 1208  Weight: 61.2 kg   Body mass index is 21.79 kg/m.   Physical Exam:  GENERAL: Patient is alert awake and oriented. Not in obvious distress.  Thinly built. HENT: No scleral pallor or icterus. Pupils equally reactive to light. Oral mucosa is moist NECK: is supple, no gross swelling noted. CHEST: Diminished breath sounds bilaterally.  Coarse breath sounds noted.  Tenderness over the sternal area. CVS: S1 and S2 heard, no murmur. Regular rate and rhythm.  ABDOMEN: Soft, non-tender,  bowel sounds are present. EXTREMITIES: No edema. CNS: Cranial nerves are intact. No focal motor deficits. SKIN: warm and dry without rashes.  Data Review: I have personally reviewed the following laboratory data and studies,  CBC: Recent Labs  Lab 06/19/23 1148 06/20/23 0638  WBC 10.3 7.2  HGB 12.2* 9.3*  HCT 39.2 28.2*  MCV 88.3 84.4  PLT 270 190   Basic Metabolic Panel: Recent Labs  Lab 06/19/23 1148 06/20/23 0638  NA 138 135  K 3.6 3.4*  CL 103 106  CO2 19* 23  GLUCOSE 145* 94  BUN 17 19  CREATININE 1.74* 1.14  CALCIUM 9.7 8.2*  MG  --  1.8  PHOS  --  2.8   Liver Function Tests: Recent Labs  Lab 06/19/23 1642  AST  40  ALT 19  ALKPHOS 68  BILITOT 1.7*  PROT 8.0  ALBUMIN 3.6   No results for input(s): "LIPASE", "AMYLASE" in the last 168 hours. No results for input(s): "AMMONIA" in the last 168 hours. Cardiac Enzymes: Recent Labs  Lab 06/19/23 1642  CKTOTAL 56   BNP (last 3 results) Recent Labs    06/19/23 1148  BNP 111.7*    ProBNP (last 3 results) No results for input(s): "PROBNP" in the last 8760 hours.  CBG: No results for input(s): "GLUCAP" in the last 168 hours. No results found for this or any previous visit (from the past 240 hours).   Studies: CT Chest Wo Contrast Result Date: 06/19/2023 CLINICAL DATA:  Chest trauma fall with chest pain EXAM: CT CHEST WITHOUT CONTRAST TECHNIQUE: Multidetector CT imaging of the chest was performed following the standard protocol without IV contrast. RADIATION DOSE REDUCTION: This exam was performed according to the departmental dose-optimization program which includes automated exposure control, adjustment of the mA and/or kV according to patient size and/or use of iterative reconstruction technique. COMPARISON:  Chest x-ray 06/19/2023, CT chest 06/22/2019 FINDINGS: Cardiovascular: Limited evaluation without intravenous contrast. Moderate aortic atherosclerosis. No aneurysm. Coronary vascular calcifications.  Normal cardiac size. No pericardial effusion Mediastinum/Nodes: Patent trachea. No thyroid mass. Borderline right paratracheal node measuring 9 mm. Left paratracheal node measuring 12 mm. Subcarinal lymph node measuring up to 11 mm. Esophagus within normal limits. Lungs/Pleura: No pleural effusion or pneumothorax. Heterogeneous airspace disease and consolidation with peribronchovascular thickening in the right middle lobe and right lower lobe. Upper Abdomen: No acute finding Musculoskeletal: Acute displaced lower sternal body fracture with surrounding stranding. Mild superior endplate deformity at T4 is age indeterminate but new since 2021 comparison CT. Mild gynecomastia IMPRESSION: 1. Acute displaced lower sternal body fracture with surrounding edema 2. Right middle and lower lobe consolidative airspace disease concerning for pneumonia, imaging follow-up to resolution recommended to exclude underlying mass particularly at right lung base. 3. Age indeterminate mild superior endplate deformity at T4, correlate for point tenderness 4. Mild mediastinal adenopathy is likely reactive Aortic Atherosclerosis (ICD10-I70.0). Electronically Signed   By: Jasmine Pang M.D.   On: 06/19/2023 15:57   DG Chest 2 View Result Date: 06/19/2023 CLINICAL DATA:  Shortness of breath, hypoxia, fall, chest pain EXAM: CHEST - 2 VIEW COMPARISON:  01/29/2017 FINDINGS: Heart and mediastinal contours within normal limits. Aortic atherosclerosis. Left lung clear. Right basilar airspace opacity. No effusions or pneumothorax. No acute bony abnormality. No visualized displaced rib fracture. IMPRESSION: Right basilar airspace opacity could reflect atelectasis or pneumonia. Electronically Signed   By: Charlett Nose M.D.   On: 06/19/2023 11:53      Joycelyn Das, MD  [facility] 06/20/2023  If 7PM-7AM, please contact night-coverage

## 2023-06-21 DIAGNOSIS — S2220XA Unspecified fracture of sternum, initial encounter for closed fracture: Secondary | ICD-10-CM | POA: Diagnosis not present

## 2023-06-21 LAB — BASIC METABOLIC PANEL
Anion gap: 8 (ref 5–15)
BUN: 12 mg/dL (ref 8–23)
CO2: 18 mmol/L — ABNORMAL LOW (ref 22–32)
Calcium: 8.1 mg/dL — ABNORMAL LOW (ref 8.9–10.3)
Chloride: 110 mmol/L (ref 98–111)
Creatinine, Ser: 1.03 mg/dL (ref 0.61–1.24)
GFR, Estimated: >60 mL/min (ref >60)
Glucose, Bld: 71 mg/dL (ref 70–99)
Potassium: 4 mmol/L (ref 3.5–5.1)
Sodium: 136 mmol/L (ref 135–145)

## 2023-06-21 LAB — CBC
HCT: 27.5 % — ABNORMAL LOW (ref 39.0–52.0)
Hemoglobin: 8.9 g/dL — ABNORMAL LOW (ref 13.0–17.0)
MCH: 27.6 pg (ref 26.0–34.0)
MCHC: 32.4 g/dL (ref 30.0–36.0)
MCV: 85.1 fL (ref 80.0–100.0)
Platelets: 185 10*3/uL (ref 150–400)
RBC: 3.23 MIL/uL — ABNORMAL LOW (ref 4.22–5.81)
RDW: 16.2 % — ABNORMAL HIGH (ref 11.5–15.5)
WBC: 6.5 10*3/uL (ref 4.0–10.5)
nRBC: 0 % (ref 0.0–0.2)

## 2023-06-21 MED ORDER — VITAMIN B-1 100 MG PO TABS: 100.0000 mg | ORAL_TABLET | Freq: Every day | ORAL | 0 refills | Status: AC

## 2023-06-21 MED ORDER — ALBUTEROL SULFATE HFA 108 (90 BASE) MCG/ACT IN AERS: 2.0000 | INHALATION_SPRAY | Freq: Four times a day (QID) | RESPIRATORY_TRACT | 0 refills | Status: DC | PRN

## 2023-06-21 MED ORDER — HYDROCODONE-ACETAMINOPHEN 5-325 MG PO TABS: 1.0000 | ORAL_TABLET | ORAL | 0 refills | Status: DC | PRN

## 2023-06-21 MED ORDER — DOXYCYCLINE HYCLATE 100 MG PO TABS: 100.0000 mg | ORAL_TABLET | Freq: Two times a day (BID) | ORAL | Status: DC

## 2023-06-21 MED ORDER — CEFADROXIL 500 MG PO CAPS: 500.0000 mg | ORAL_CAPSULE | Freq: Two times a day (BID) | ORAL | 0 refills | Status: AC

## 2023-06-21 MED ORDER — GUAIFENESIN ER 600 MG PO TB12: 600.0000 mg | ORAL_TABLET | Freq: Two times a day (BID) | ORAL | 0 refills | Status: AC

## 2023-06-21 MED ORDER — FOLIC ACID 400 MCG PO TABS: 400.0000 ug | ORAL_TABLET | Freq: Every day | ORAL | 1 refills | Status: DC

## 2023-06-21 MED ORDER — DOXYCYCLINE HYCLATE 100 MG PO TABS: 100.0000 mg | ORAL_TABLET | Freq: Two times a day (BID) | ORAL | 0 refills | Status: AC

## 2023-06-21 MED ORDER — LIDOCAINE 5 % EX PTCH: 1.0000 | MEDICATED_PATCH | CUTANEOUS | 0 refills | Status: DC

## 2023-06-21 MED ORDER — NICOTINE 14 MG/24HR TD PT24: 14.0000 mg | MEDICATED_PATCH | Freq: Every day | TRANSDERMAL | 0 refills | Status: DC

## 2023-06-21 NOTE — Plan of Care (Signed)

## 2023-06-21 NOTE — TOC Transition Note (Signed)
 Transition of Care [facility]) - Discharge Note   Patient Details  Name: Ryan Ortega MRN: [id number] Date of Birth: March 27, 1946  [facility] CM/SW Contact:  Ronny Bacon, RN Phone Number: 06/21/2023, 9:28 AM   Clinical Narrative:   Patient is being discharged today. Cory with [facility] made aware.    Final next level of care: Home w Home Health Services Barriers to Discharge: No Barriers Identified   Patient Goals and CMS Choice Patient states their goals for this hospitalization and ongoing recovery are:: to return to home CMS Medicare.gov Compare Post Acute Care list provided to:: Patient Choice offered to / list presented to : Patient      Discharge Placement                       Discharge Plan and Services Additional resources added to the After Visit Summary for     Discharge Planning Services: CM Consult Post Acute Care Choice: Home Health, Durable Medical Equipment          DME Arranged: Dan Humphreys rolling DME Agency: [employer] Date DME Agency Contacted: 06/20/23 Time DME Agency Contacted: 1318 Representative spoke with at DME Agency: Vaughan Basta HH Arranged: PT HH Agency:  (left Kandee Keen with [facility] a message) Date HH Agency Contacted: 06/20/23 Time HH Agency Contacted: 1318 Representative spoke with at [facility] Agency: Kandee Keen await call back  Social Drivers of Health (SDOH) Interventions SDOH Screenings   Food Insecurity: Patient Declined (06/19/2023)  Housing: Unknown (06/19/2023)  Transportation Needs: Patient Declined (06/19/2023)  Utilities: Patient Declined (06/19/2023)  Alcohol Screen: Low Risk  (01/22/2018)  Depression (PHQ2-9): Low Risk  (06/02/2023)  Financial Resource Strain: Low Risk  (04/14/2017)  Physical Activity: Inactive (04/14/2017)  Social Connections: Unknown (06/19/2023)  Stress: No Stress Concern Present (04/14/2017)  Tobacco Use: High Risk (06/19/2023)     Readmission Risk Interventions     No data to display

## 2023-06-21 NOTE — Discharge Summary (Signed)
 Physician Discharge Summary  OSMEL DYKSTRA GUY:[id number] DOB: [birth year] DOA: 06/19/2023  PCP: Caesar Bookman, NP  Admit date: 06/19/2023 Discharge date: 06/22/2023  Admitted From: Home  Discharge disposition: Home with home health.   Recommendations for Outpatient Follow-Up:   Follow up with your primary care provider in one week.  Check CBC, BMP, magnesium in the next visit Patient should be encouraged quitting on alcohol and smoking.  Discharge Diagnosis:   Principal Problem:   Sternal fracture Active Problems:   Fall at home, initial encounter   Acute respiratory failure with hypoxia (HCC)   COPD (chronic obstructive pulmonary disease) (HCC)   CAP (community acquired pneumonia)   Elevated troponin   Abnormal EKG   AKI (acute kidney injury) (HCC)   Elevated blood pressure reading   Normocytic anemia   Chronic hepatitis C without hepatic coma (HCC)   Alcohol abuse   Smoking 1/2 pack a day or less    Discharge Condition: Improved.  Diet recommendation:   Regular.  Wound care: None.  Code status: Full.   History of Present Illness:   ANOTHONY Ortega is a 78 y.o. male with medical history significant of emphysema/COPD, hepatitis C, tobacco abuse, alcohol use, and GERD to the hospital with complaints of chest pain after having a fall.  He continued to have sharp chest pain and shortness of breath especially on trying to take deep breath.  Had gone to urgent care center when he was noted to have hypoxia with pulse ox of 87% on room air.  Patient also with history of smoking and alcohol consumption daily.  In the ED, patient was noted to be slightly febrile with a temperature of 99.8 tachycardic tachypneic and pulse ox was 86% on room air which improved with 2 L of nasal cannula oxygen.  WBC of 17.3.  Creatinine of 1.7.  Troponin initial was 21.  CT scan of the chest without contrast showed displaced lower sternal fracture with consolidation of the right middle and  lower lobe.  Patient was given empiric antibiotics and was admitted to hospital for further evaluation and treatment.     Hospital Course:   Following conditions were addressed during hospitalization as listed below,  Sternal fracture secondary to fall  CT scan of the chest revealed an acute displaced lower sternal body fracture with surrounding edema.  Patient received supportive care including analgesics Lidoderm patch and physical therapy evaluation.  Continue  incentive spirometry, deep breathing on discharge.  Pain is much better controlled.  Continue with the current regimen while on discharge.    Acute respiratory failure with hypoxia Community-acquired pneumonia COPD with possible exacerbation Was hypoxic on presentation.  CT scan of the chest did also note right middle and lower lobe consolidative airspace disease concerning for pneumonia, currently on Rocephin and doxycycline continue incentive spirometry and flutter valve. Procalcitonin (0.58).  Will continue doxycycline and cefadroxil on discharge.  Has improved.  Currently on room air.  Mild hypokalemia.  Replenished and improved.  Latest potassium of 4.0.   Elevated troponin Abnormal EKG Acute.  High-sensitivity troponins mildly elevated but essentially flat at 18->21.  Secondary to trauma an demand ischemia.   2D echocardiogram shows LV ejection fraction of 60 to 65% with no regional wall motion abnormality.   acute kidney injury Improved with hydration.  Initial creatinine of 1.7.  Creatinine prior to discharge was 1.0.   Elevated blood pressure reading On presentation blood pressure as high as 187/97.  No history of hypertension.  Blood pressure has improved at this time.  Blood pressure prior to discharge was 123/61.  Normocytic anemia Chronic.  Hemoglobin today at 8.9..  Initial hemoglobin noted to be 12.2 with elevated RDW.  No obvious bruising or blood loss.  Will need to be followed up as outpatient.   History of  hepatitis C Acute hepatitis panel positive for HCV antibody positive.  Recommend outpatient follow-up.   Alcohol abuse Did not have any acute withdrawal symptoms during hospitalization.  Continue thiamine folic acid multivitamin on discharge.   Tobacco abuse Continue nicotine patch.  Counseled against smoking.   Disposition.  At this time, patient is stable for disposition home with outpatient PCP follow-up.  Medical Consultants:   None.  Procedures:    None Subjective:   Today, patient was seen and examined at bedside.  Continues to feel better.  Has mild chest discomfort but otherwise okay.  Discharge Exam:   Vitals:   06/21/23 0328 06/21/23 0838  BP: 136/80 123/61  Pulse: 77 77  Resp: 17 16  Temp: 98.6 F (37 C) 98.6 F (37 C)  SpO2: 95% 95%   Vitals:   06/20/23 1513 06/20/23 1954 06/21/23 0328 06/21/23 0838  BP: (!) 157/72 (!) 159/74 136/80 123/61  Pulse: 85 92 77 77  Resp: 17 17 17 16   Temp: 98.6 F (37 C) 99.9 F (37.7 C) 98.6 F (37 C) 98.6 F (37 C)  TempSrc: Oral Oral    SpO2: 95% 96% 95% 95%  Weight:      Height:       Body mass index is 21.79 kg/m.  General: Alert awake, not in obvious distress HENT: pupils equally reacting to light,  No scleral pallor or icterus noted. Oral mucosa is moist.  Chest:  Diminished breath sounds bilaterally.  Tenderness over the chest wall. CVS: S1 &S2 heard. No murmur.  Regular rate and rhythm. Abdomen: Soft, nontender, nondistended.  Bowel sounds are heard.   Extremities: No cyanosis, clubbing or edema.  Peripheral pulses are palpable. Psych: Alert, awake and oriented, normal mood CNS:  No cranial nerve deficits.  Power equal in all extremities.   Skin: Warm and dry.  No rashes noted.  The results of significant diagnostics from this hospitalization (including imaging, microbiology, ancillary and laboratory) are listed below for reference.     Diagnostic Studies:   ECHOCARDIOGRAM COMPLETE Result Date:  06/20/2023    ECHOCARDIOGRAM REPORT   Patient Name:   Ryan Ortega Date of Exam: 06/20/2023 Medical Rec #:  [id number]       Height:       66.0 in Accession #:    [id number]      Weight:       135.0 lb Date of Birth:  11-Dec-1945       BSA:          1.692 m Patient Age:    77 years        BP:           139/91 mmHg Patient Gender: M               HR:           85 bpm. Exam Location:  Inpatient Procedure: 2D Echo, Cardiac Doppler, Color Doppler and Intracardiac            Opacification Agent (Both Spectral and Color Flow Doppler were            utilized during procedure). Indications:    Chest Pain  History:        Patient has no prior history of Echocardiogram examinations.                 COPD; Risk Factors:Current Smoker.  Sonographer:    Karma Ganja Referring Phys: [id number] QIONGE Korey Prashad IMPRESSIONS  1. Left ventricular ejection fraction, by estimation, is 60 to 65%. The left ventricle has normal function. The left ventricle has no regional wall motion abnormalities. Left ventricular diastolic parameters were normal.  2. Right ventricular systolic function is normal. The right ventricular size is normal. There is normal pulmonary artery systolic pressure.  3. The mitral valve is normal in structure. No evidence of mitral valve regurgitation. No evidence of mitral stenosis.  4. The aortic valve is tricuspid. There is mild calcification of the aortic valve. Aortic valve regurgitation is trivial. Aortic valve sclerosis is present, with no evidence of aortic valve stenosis.  5. The inferior vena cava is normal in size with greater than 50% respiratory variability, suggesting right atrial pressure of 3 mmHg. FINDINGS  Left Ventricle: Left ventricular ejection fraction, by estimation, is 60 to 65%. The left ventricle has normal function. The left ventricle has no regional wall motion abnormalities. Definity contrast agent was given IV to delineate the left ventricular  endocardial borders. Strain was performed and the  global longitudinal strain is indeterminate. The left ventricular internal cavity size was normal in size. There is no left ventricular hypertrophy. Left ventricular diastolic parameters were normal. Right Ventricle: The right ventricular size is normal. No increase in right ventricular wall thickness. Right ventricular systolic function is normal. There is normal pulmonary artery systolic pressure. The tricuspid regurgitant velocity is 2.11 m/s, and  with an assumed right atrial pressure of 3 mmHg, the estimated right ventricular systolic pressure is 20.8 mmHg. Left Atrium: Left atrial size was normal in size. Right Atrium: Right atrial size was normal in size. Pericardium: There is no evidence of pericardial effusion. Mitral Valve: The mitral valve is normal in structure. No evidence of mitral valve regurgitation. No evidence of mitral valve stenosis. Tricuspid Valve: The tricuspid valve is normal in structure. Tricuspid valve regurgitation is mild . No evidence of tricuspid stenosis. Aortic Valve: The aortic valve is tricuspid. There is mild calcification of the aortic valve. Aortic valve regurgitation is trivial. Aortic valve sclerosis is present, with no evidence of aortic valve stenosis. Aortic valve mean gradient measures 2.0 mmHg. Aortic valve peak gradient measures 3.5 mmHg. Aortic valve area, by VTI measures 2.24 cm. Pulmonic Valve: The pulmonic valve was normal in structure. Pulmonic valve regurgitation is trivial. No evidence of pulmonic stenosis. Aorta: The aortic root is normal in size and structure. Venous: The inferior vena cava is normal in size with greater than 50% respiratory variability, suggesting right atrial pressure of 3 mmHg. IAS/Shunts: The interatrial septum was not well visualized. Additional Comments: 3D was performed not requiring image post processing on an independent workstation and was indeterminate.  LEFT VENTRICLE PLAX 2D LVIDd:         4.00 cm   Diastology LVIDs:         2.60  cm   LV e' medial:    10.40 cm/s LV PW:         1.10 cm   LV E/e' medial:  8.1 LV IVS:        1.00 cm   LV e' lateral:   9.79 cm/s LVOT diam:     1.90 cm   LV E/e' lateral: 8.6  LV SV:         36 LV SV Index:   21 LVOT Area:     2.84 cm  RIGHT VENTRICLE             IVC RV Basal diam:  3.50 cm     IVC diam: 1.20 cm RV S prime:     19.60 cm/s TAPSE (M-mode): 2.2 cm LEFT ATRIUM           Index        RIGHT ATRIUM           Index LA diam:      3.30 cm 1.95 cm/m   RA Area:     11.50 cm LA Vol (A4C): 57.7 ml 34.10 ml/m  RA Volume:   24.90 ml  14.71 ml/m  AORTIC VALVE AV Area (Vmax):    2.39 cm AV Area (Vmean):   2.13 cm AV Area (VTI):     2.24 cm AV Vmax:           94.10 cm/s AV Vmean:          61.500 cm/s AV VTI:            0.162 m AV Peak Grad:      3.5 mmHg AV Mean Grad:      2.0 mmHg LVOT Vmax:         79.40 cm/s LVOT Vmean:        46.100 cm/s LVOT VTI:          0.128 m LVOT/AV VTI ratio: 0.79  AORTA Ao Root diam: 3.40 cm MITRAL VALVE               TRICUSPID VALVE MV Area (PHT): 5.42 cm    TR Peak grad:   17.8 mmHg MV Decel Time: 140 msec    TR Vmax:        211.00 cm/s MV E velocity: 83.90 cm/s MV A velocity: 65.00 cm/s  SHUNTS MV E/A ratio:  1.29        Systemic VTI:  0.13 m                            Systemic Diam: 1.90 cm Charlton Haws MD Electronically signed by Charlton Haws MD Signature Date/Time: 06/20/2023/12:40:34 PM    Final    CT Chest Wo Contrast Result Date: 06/19/2023 CLINICAL DATA:  Chest trauma fall with chest pain EXAM: CT CHEST WITHOUT CONTRAST TECHNIQUE: Multidetector CT imaging of the chest was performed following the standard protocol without IV contrast. RADIATION DOSE REDUCTION: This exam was performed according to the departmental dose-optimization program which includes automated exposure control, adjustment of the mA and/or kV according to patient size and/or use of iterative reconstruction technique. COMPARISON:  Chest x-ray 06/19/2023, CT chest 06/22/2019 FINDINGS: Cardiovascular:  Limited evaluation without intravenous contrast. Moderate aortic atherosclerosis. No aneurysm. Coronary vascular calcifications. Normal cardiac size. No pericardial effusion Mediastinum/Nodes: Patent trachea. No thyroid mass. Borderline right paratracheal node measuring 9 mm. Left paratracheal node measuring 12 mm. Subcarinal lymph node measuring up to 11 mm. Esophagus within normal limits. Lungs/Pleura: No pleural effusion or pneumothorax. Heterogeneous airspace disease and consolidation with peribronchovascular thickening in the right middle lobe and right lower lobe. Upper Abdomen: No acute finding Musculoskeletal: Acute displaced lower sternal body fracture with surrounding stranding. Mild superior endplate deformity at T4 is age indeterminate but new since 2021 comparison CT. Mild gynecomastia IMPRESSION: 1. Acute displaced lower sternal  body fracture with surrounding edema 2. Right middle and lower lobe consolidative airspace disease concerning for pneumonia, imaging follow-up to resolution recommended to exclude underlying mass particularly at right lung base. 3. Age indeterminate mild superior endplate deformity at T4, correlate for point tenderness 4. Mild mediastinal adenopathy is likely reactive Aortic Atherosclerosis (ICD10-I70.0). Electronically Signed   By: Jasmine Pang M.D.   On: 06/19/2023 15:57   DG Chest 2 View Result Date: 06/19/2023 CLINICAL DATA:  Shortness of breath, hypoxia, fall, chest pain EXAM: CHEST - 2 VIEW COMPARISON:  01/29/2017 FINDINGS: Heart and mediastinal contours within normal limits. Aortic atherosclerosis. Left lung clear. Right basilar airspace opacity. No effusions or pneumothorax. No acute bony abnormality. No visualized displaced rib fracture. IMPRESSION: Right basilar airspace opacity could reflect atelectasis or pneumonia. Electronically Signed   By: Charlett Nose M.D.   On: 06/19/2023 11:53     Labs:   Basic Metabolic Panel: Recent Labs  Lab 06/19/23 1148  06/20/23 0638 06/21/23 0656  NA 138 135 136  K 3.6 3.4* 4.0  CL 103 106 110  CO2 19* 23 18*  GLUCOSE 145* 94 71  BUN 17 19 12   CREATININE 1.74* 1.14 1.03  CALCIUM 9.7 8.2* 8.1*  MG  --  1.8  --   PHOS  --  2.8  --    GFR Estimated Creatinine Clearance: 51.2 mL/min (by C-G formula based on SCr of 1.03 mg/dL). Liver Function Tests: Recent Labs  Lab 06/19/23 1642  AST 40  ALT 19  ALKPHOS 68  BILITOT 1.7*  PROT 8.0  ALBUMIN 3.6   No results for input(s): "LIPASE", "AMYLASE" in the last 168 hours. No results for input(s): "AMMONIA" in the last 168 hours. Coagulation profile No results for input(s): "INR", "PROTIME" in the last 168 hours.  CBC: Recent Labs  Lab 06/19/23 1148 06/20/23 0638 06/21/23 0656  WBC 10.3 7.2 6.5  HGB 12.2* 9.3* 8.9*  HCT 39.2 28.2* 27.5*  MCV 88.3 84.4 85.1  PLT 270 190 185   Cardiac Enzymes: Recent Labs  Lab 06/19/23 1642  CKTOTAL 56   BNP: Invalid input(s): "POCBNP" CBG: No results for input(s): "GLUCAP" in the last 168 hours. D-Dimer No results for input(s): "DDIMER" in the last 72 hours. Hgb A1c No results for input(s): "HGBA1C" in the last 72 hours. Lipid Profile No results for input(s): "CHOL", "HDL", "LDLCALC", "TRIG", "CHOLHDL", "LDLDIRECT" in the last 72 hours. Thyroid function studies No results for input(s): "TSH", "T4TOTAL", "T3FREE", "THYROIDAB" in the last 72 hours.  Invalid input(s): "FREET3" Anemia work up No results for input(s): "VITAMINB12", "FOLATE", "FERRITIN", "TIBC", "IRON", "RETICCTPCT" in the last 72 hours. Microbiology No results found for this or any previous visit (from the past 240 hours).   Discharge Instructions:   Discharge Instructions     Call MD for:  difficulty breathing, headache or visual disturbances   Complete by: As directed    Call MD for:  severe uncontrolled pain   Complete by: As directed    Call MD for:  temperature >100.4   Complete by: As directed    Diet - low sodium  heart healthy   Complete by: As directed    Discharge instructions   Complete by: As directed    Follow-up with your primary care provider in 1 week.  Check blood work at that time.  Please do not smoke and drink.  Take medications as prescribed.  Seek medical attention for worsening symptoms.  No overexertion.   Increase activity slowly  Complete by: As directed       Allergies as of 06/21/2023   No Known Allergies      Medication List     TAKE these medications    albuterol 108 (90 Base) MCG/ACT inhaler Commonly known as: VENTOLIN HFA Inhale 2 puffs into the lungs every 6 (six) hours as needed for wheezing or shortness of breath.   B-12 1000 MCG Tabs Take 1 tablet (1,000 mcg total) by mouth daily.   cefadroxil 500 MG capsule Commonly known as: DURICEF Take 1 capsule (500 mg total) by mouth 2 (two) times daily for 3 days.   Daily-Vite Multivitamin Tabs Take 1 tablet by mouth daily.   doxycycline 100 MG tablet Commonly known as: VIBRA-TABS Take 1 tablet (100 mg total) by mouth 2 (two) times daily for 3 days.   folic acid 400 MCG tablet Commonly known as: FOLVITE Take 1 tablet (400 mcg total) by mouth daily.   guaiFENesin 600 MG 12 hr tablet Commonly known as: Mucinex Take 1 tablet (600 mg total) by mouth 2 (two) times daily for 15 days.   HYDROcodone-acetaminophen 5-325 MG tablet Commonly known as: NORCO/VICODIN Take 1 tablet by mouth every 4 (four) hours as needed for moderate pain (pain score 4-6).   lidocaine 5 % Commonly known as: LIDODERM Place 1 patch onto the skin daily. Remove & Discard patch within 12 hours or as directed by MD   mirtazapine 15 MG tablet Commonly known as: REMERON TAKE 1 TABLET BY MOUTH EVERYDAY AT BEDTIME   nicotine 14 mg/24hr patch Commonly known as: NICODERM CQ - dosed in mg/24 hours Place 1 patch (14 mg total) onto the skin daily.   thiamine 100 MG tablet Commonly known as: Vitamin B-1 Take 1 tablet (100 mg total) by mouth  daily.        Follow-up Information     [facility] Follow up.   Specialty: Home Health Services Contact information: [address] [place] [address] [phone]         Ngetich, Dinah C, NP Follow up in 1 week(s).   Specialty: Family Medicine Contact information: [address] [place] [address] [phone]                  Time coordinating discharge: 39 minutes  Signed:  Solly Derasmo  [facility] 06/22/2023, 8:47 AM

## 2023-06-21 NOTE — Progress Notes (Signed)
 This patient is receiving the antibiotic doxycycline by the intravenous route.   Based on criteria approved by the Pharmacy and Therapeutics Committee, and the  Infectious Disease Division, the antibiotic(s) is / are being converted to equivalent oral dose form(s). These criteria include:  Patient being treated for a respiratory tract infection, urinary tract infection, cellulitis, or Clostridium Difficile associated diarrhea  The patient is not neutropenic and does not exhibit a GI malabsorption state  The patient is eating (either orally or per tube) and/or has been taking other orally administered medications for at least 24 hours.  The patient is improving clinically (physician assessment and a 24-hour Tmax of 100.5 F).   If you have questions about this conversion, please contact the pharmacy department.   Thank you for allowing pharmacy to be a part of this patient's care.  Thelma Barge, PharmD, BCPS Clinical Pharmacist

## 2023-06-21 NOTE — Progress Notes (Signed)
 Dione Housekeeper to be D/C'd  per MD order.  Discussed with the patient and all questions fully answered.  VSS, Skin clean, dry and intact without evidence of skin break down, no evidence of skin tears noted.  IV catheter discontinued intact. Site without signs and symptoms of complications. Dressing and pressure applied.  An After Visit Summary was printed and given to the patient. Patient received prescription.  D/c education completed with patient/family including follow up instructions, medication list, d/c activities limitations if indicated, with other d/c instructions as indicated by MD - patient able to verbalize understanding, all questions fully answered.   Patient instructed to return to ED, call 911, or call MD for any changes in condition.   Patient to be escorted via WC, and D/C home via private auto.

## 2023-06-23 ENCOUNTER — Telehealth: Payer: Self-pay

## 2023-06-23 NOTE — Transitions of Care (Post Inpatient/ED Visit) (Signed)
 06/23/2023  Name: Ryan Ortega MRN: [id number] DOB: [birth year]  Today's TOC FU Call Status: Today's TOC FU Call Status:: Successful TOC FU Call Completed TOC FU Call Complete Date: 06/23/23 Patient's Name and Date of Birth confirmed.  Transition Care Management Follow-up Telephone Call Date of Discharge: 06/22/23 Discharge Facility: [facility]) Type of Discharge: Inpatient Admission Primary Inpatient Discharge Diagnosis:: Sternal fracture How have you been since you were released from the hospital?: Better Any questions or concerns?: No (patient denies questions)  Items Reviewed: Did you receive and understand the discharge instructions provided?: No (patient states he does not read or write but states his wife reads/write and she helps) Any new allergies since your discharge?: No Dietary orders reviewed?: Yes Type of Diet Ordered:: Regular Do you have support at home?: Yes People in Home: spouse Name of Support/Comfort Primary Source: lives at home with spouse,Ryan Ortega  Medications Reviewed Today: Medications Reviewed Today     Reviewed by Ryan Oto, RN (Registered Nurse) on 06/23/23 at 1543  Med List Status: <None>   Medication Order Taking? Sig Documenting Provider Last Dose Status Informant  albuterol (VENTOLIN HFA) 108 (90 Base) MCG/ACT inhaler [id number] Yes Inhale 2 puffs into the lungs every 6 (six) hours as needed for wheezing or shortness of breath. Pokhrel, Ryan [place], Ortega Taking Active   cefadroxil (DURICEF) 500 MG capsule [id number] Yes Take 1 capsule (500 mg total) by mouth 2 (two) times daily for 3 days. Pokhrel, Ryan Ortega Taking Active   cyanocobalamin (VITAMIN B12) 1000 MCG tablet [id number] Yes Take 1 tablet (1,000 mcg total) by mouth daily. Ryan Ortega Taking Active Spouse/Significant Other, Pharmacy Records  doxycycline (VIBRA-TABS) 100 MG tablet [id number] Yes Take 1 tablet (100 mg total) by mouth 2 (two) times daily for 3 days. Pokhrel,  Ryan Ortega Taking Active   folic acid (FOLVITE) 400 MCG tablet [id number] Yes Take 1 tablet (400 mcg total) by mouth daily. Pokhrel, Ryan Ortega Taking Active   guaiFENesin (MUCINEX) 600 MG 12 hr tablet [id number] Yes Take 1 tablet (600 mg total) by mouth 2 (two) times daily for 15 days. Pokhrel, Ryan Ortega Taking Active   HYDROcodone-acetaminophen (NORCO/VICODIN) 5-325 MG tablet [id number] Yes Take 1 tablet by mouth every 4 (four) hours as needed for moderate pain (pain score 4-6). Pokhrel, Ryan Ortega Taking Active   lidocaine (LIDODERM) 5 % [id number] No Place 1 patch onto the skin daily. Remove & Discard patch within 12 hours or as directed by Ortega  Patient not taking: Reported on 06/23/2023   Ryan Das, Ortega Not Taking Active            Med Note [facility], Ryan Ortega   Mon Jun 23, 2023  3:31 PM) Wife states patient is using Salon Pas  mirtazapine (REMERON) 15 MG tablet [id number] No TAKE 1 TABLET BY MOUTH EVERYDAY AT BEDTIME  Patient not taking: Reported on 06/23/2023   Ryan Ortega Not Taking Active Spouse/Significant Other, Pharmacy Records  Multiple Vitamin (MULTIVITAMIN) TABS [id number] Yes Take 1 tablet by mouth daily. Ryan Ortega Taking Active Spouse/Significant Other, Pharmacy Records  nicotine (NICODERM CQ - DOSED IN MG/24 HOURS) 14 mg/24hr patch [id number] No Place 1 patch (14 mg total) onto the skin daily.  Patient not taking: Reported on 06/23/2023   Ryan Das, Ortega Not Taking Active   thiamine (VITAMIN B-1) 100 MG tablet [id number] No Take 1 tablet (100 mg total) by mouth daily.  Patient not taking: Reported on 06/23/2023  Pokhrel, Ryan [place], Ortega Not Taking Active             Home Care and Equipment/Supplies: Were Home Health Services Ordered?: Yes Name of Home Health Agency:: [facility] Has Agency set up Ortega time to come to your home?: No EMR reviewed for Home Health Orders: Orders present/patient has not received call (refer to CM for follow-up) (Conference  call with wife to [facility] who states they just received the referral this afternoon and PT will be calling to schedule) Any new equipment or medical supplies ordered?: Yes Name of Medical supply agency?: Patient states he came home from hospital with new walker Were you able to get the equipment/medical supplies?: Yes Do you have any questions related to the use of the equipment/supplies?: No  Functional Questionnaire: Do you need assistance with bathing/showering or dressing?: No Do you need assistance with meal preparation?: No Do you need assistance with eating?: No Do you have difficulty maintaining continence: No Do you need assistance with getting out of bed/getting out of Ortega chair/moving?: No Do you have difficulty managing or taking your medications?: Yes (wife/adult son helps)  Follow up appointments reviewed: PCP Follow-up appointment confirmed?: Yes Date of PCP follow-up appointment?: 07/03/23 Follow-up Provider: Caesar Bookman, [facility] Follow-up appointment confirmed?: NA Do you need transportation to your follow-up appointment?: No (patient states his son takes him to Ortega appointments) Do you understand care options if your condition(s) worsen?: Yes-patient verbalized understanding  SDOH Interventions Today    Flowsheet Row Most Recent Value  SDOH Interventions   Food Insecurity Interventions Intervention Not Indicated  Housing Interventions Intervention Not Indicated  Transportation Interventions Intervention Not Indicated  Utilities Interventions Intervention Not Indicated       Goals Addressed             This Visit's Progress    TOC Care Plan - Patient will report no readmissions in the next 30 days       Current Barriers:  Medication management Knowledge deficit - Patient reports he does not read or write, wife attempted medication review and was unable to clearly state medications and said she will have son call this TOC RN to review and  states son is the one that reviewed the medication list from discharge summary   RNCM Clinical Goal(s):  Patient will work with the Care Management team over the next 30 days to address Transition of Care Barriers: Medication Management Patient will take all medications exactly as prescribed and will call provider for medication related questions  Wife/son will demonstrate understanding of rationale for each prescribed medication as evidenced by reported patient has medications in the home and is taking as per discharge medication list Patient will attend all scheduled medical appointments: PCP appointment is 07/03/23 Interventions: Evaluation of current treatment plan related to  self management and patient's adherence to plan as established by provider  Transitions of Care:  New goal. Doctor Visits  - discussed the importance of doctor visits Contacted Health RN/OT/PT - Conference call was placed to [facility] who confirmed they received referral and PT will be scheduling initial visit  Falls Interventions:  (Status:  New goal.) Short Term Goal Reviewed medications and discussed potential side effects of medications such as dizziness and frequent urination Advised patient of importance of notifying provider of falls Assessed patients knowledge of fall risk prevention secondary to previously provided education  Patient Goals/Self-Care Activities: Participate in Transition of Care Program/Attend [facility] scheduled calls Notify RN Care Manager of  TOC call rescheduling needs Take all medications as prescribed Attend all scheduled provider appointments  Follow Up Plan:  Telephone follow up appointment with care management team member scheduled for:  06/24/23 [facility] RN will follow up with son re: medications The patient has been provided with contact information for the care management team and has been advised to call with any health related questions or concerns.          Hilbert Odor RN, CCM [facility]  VBCI-Population Health RN Care Manager [phone]

## 2023-06-24 ENCOUNTER — Other Ambulatory Visit: Payer: Self-pay | Admitting: Family

## 2023-06-24 ENCOUNTER — Telehealth: Payer: Self-pay

## 2023-06-24 DIAGNOSIS — R0789 Other chest pain: Secondary | ICD-10-CM

## 2023-06-24 MED ORDER — HYDROCODONE-ACETAMINOPHEN 5-325 MG PO TABS: 1.0000 | ORAL_TABLET | ORAL | 0 refills | Status: DC | PRN

## 2023-06-24 NOTE — Patient Outreach (Signed)
 Care Management  Transitions of Care Program Transitions of Care Post-discharge  Follow Up Call    06/24/2023 Name: Ryan Ortega MRN: [id number] DOB: 05-30-1945  Subjective: Ryan Ortega is a 78 y.o. year old male who is a primary care patient of Ngetich, Dinah C, NP. The Care Management team Engaged with patient Engaged with patient's son, Ryan Ortega by telephone to assess and address transitions of care needs.   Consent to Services:  Patient was given information about care management services, agreed to services, and gave verbal consent to participate.   Assessment:     TOC RN returned son's voicemail and discussed medications with son who stated patient has the prescription medications and the son will pick up the OTC medications. Son agreed to [facility] RN follow up call and was scheduled 07/01/23 at 3pm and understands he can call [facility] RN with any questions prior to scheduled call. Son will call PCP office to let them know the 07/03/23 should be a hospital follow up and will call [facility] to make sure they have his correct number and discuss a start date.       SDOH Interventions    Flowsheet Row Telephone from 06/23/2023 in [place] POPULATION HEALTH DEPARTMENT  SDOH Interventions   Food Insecurity Interventions Intervention Not Indicated  Housing Interventions Intervention Not Indicated  Transportation Interventions Intervention Not Indicated  Utilities Interventions Intervention Not Indicated        Goals Addressed             This Visit's Progress    TOC Care Plan - Patient will report no readmissions in the next 30 days       Current Barriers:  Medication management Knowledge deficit - Patient reports he does not read or write, wife attempted medication review and was unable to clearly state medications and said she will have son call this [facility] RN to review and states son is the one that reviewed the medication list from discharge summary - 06/24/23 Call from son who  reported the patient has all of the prescription medications and he will pick up the OTC medications from the discharge list that was reviewed with son, Ryan Ortega  RNCM Clinical Goal(s):  Patient will work with the Care Management team over the next 30 days to address Transition of Care Barriers: Medication Management Patient will take all medications exactly as prescribed and will call provider for medication related questions  Wife/son will demonstrate understanding of rationale for each prescribed medication as evidenced by reported patient has medications in the home and is taking as per discharge medication list Patient will attend all scheduled medical appointments: PCP appointment is 07/03/23 - Per 06/24/23 Follow up call with son, he will call PCP office and let them know this appointment is a hospital follow up Interventions: Evaluation of current treatment plan related to  self management and patient's adherence to plan as established by provider  Transitions of Care:  New goal. Doctor Visits  - discussed the importance of doctor visits Contacted Health RN/OT/PT - Conference call was placed to [facility] who confirmed they received referral and PT will be scheduling initial visit - Per quick follow up call with son (who was working), Ryan Ortega on 06/24/23 He will follow up with [facility] to make sure they have his correct number (patient's wife gave the incorrect number) and son will see when they plan to do the start of care   Falls Interventions:  (Status:  New goal.) Short  Term Goal Reviewed medications and discussed potential side effects of medications such as dizziness and frequent urination Advised patient of importance of notifying provider of falls Assessed patients knowledge of fall risk prevention secondary to previously provided education  Patient Goals/Self-Care Activities: Participate in Transition of Care Program/Attend [facility] scheduled calls Notify RN Care Manager of TOC call  rescheduling needs Take all medications as prescribed Attend all scheduled provider appointments  Follow Up Plan:  Telephone follow up appointment with care management team member scheduled for:  07/01/23 3pm with son, Ryan Ortega The patient has been provided with contact information for the care management team and has been advised to call with any health related questions or concerns.          Plan: Telephone follow up appointment with care management team member scheduled for: 07/01/23 1pm  The patient has been provided with contact information for the care management team and has been advised to call with any health related questions or concerns.   Hilbert Odor RN, CCM [place]  VBCI-Population Health RN Care Manager [phone]

## 2023-06-24 NOTE — Telephone Encounter (Signed)
 Patient was Prescribed at the Hospital by Dr. Cristela Felt. Was given #15 on 06/21/2023.   Patient has a Hospital Follow up on 07/03/2023  Pended and sent to [facility] for approval.

## 2023-06-24 NOTE — Telephone Encounter (Signed)
 Copied from CRM [id number]. Topic: Clinical - Medication Refill >> Jun 24, 2023 11:15 AM [name] wrote: Most Recent Primary Care Visit:  Provider: Richarda Blade C  Department: [facility]  Visit Type: OFFICE VISIT  Date: 06/02/2023  Medication: HYDROcodone-acetaminophen (NORCO/VICODIN) 5-325 MG tablet  Has the patient contacted their pharmacy? No (Agent: If no, request that the patient contact the pharmacy for the refill. If patient does not wish to contact the pharmacy document the reason why and proceed with request.) (Agent: If yes, when and what did the pharmacy advise?)  Is this the correct pharmacy for this prescription? Yes If no, delete pharmacy and type the correct one.  This is the patient's preferred pharmacy:  [id number] [place], [place] - [address] [place] [address] Phone: [phone] Fax: [phone]  [place] [facility] [address] [place] [address] Phone: [phone] Fax: [phone]  [facility] - [place], [place] - [address] [place] [address] Phone: [phone] Fax: [phone]  [facility] [address] [place] [address] Phone: [phone] Fax: [phone]   Has the prescription been filled recently? No  Is the patient out of the medication? Yes  Has the patient been seen for an appointment in the last year OR does the patient have an upcoming appointment? Yes  Can we respond through MyChart? No  Agent: Please be advised that Rx refills may take up to 3 business days. We ask that you follow-up with your pharmacy.

## 2023-07-01 ENCOUNTER — Other Ambulatory Visit: Payer: Self-pay

## 2023-07-01 ENCOUNTER — Telehealth: Payer: Self-pay

## 2023-07-01 NOTE — Patient Outreach (Signed)
 Care Management  Transitions of Care Program Transitions of Care Post-discharge week 2  07/01/2023 Name: Ryan Ortega MRN: [id number] DOB: [birth year]  Subjective: Ryan Ortega is a 78 y.o. year old male who is a primary care patient of Ngetich, Dinah C, NP. The Care Management team was unable to reach the patient by phone to assess and address transitions of care needs.   Plan: Additional outreach attempts will be made to reach the patient enrolled in the [facility] Program (Post Inpatient/ED Visit).  Hilbert Odor RN, CCM [place]  VBCI-Population Health RN Care Manager [phone]

## 2023-07-02 ENCOUNTER — Telehealth: Payer: Self-pay

## 2023-07-02 NOTE — Patient Outreach (Signed)
 Care Management  Transitions of Care Program Transitions of Care Post-discharge week 2  07/02/2023 Name: Ryan Ortega MRN: [id number] DOB: 09-17-1945  Subjective: [name] is a 78 y.o. year old male who is a primary care patient of Ngetich, Dinah C, NP. The Care Management team was unable to reach the patient by phone to assess and address transitions of care needs.   Plan: Additional outreach attempts will be made to reach the patient enrolled in the [facility] Program (Post Inpatient/ED Visit).  Hilbert Odor RN, CCM [place]  VBCI-Population Health RN Care Manager [phone]

## 2023-07-03 ENCOUNTER — Encounter: Payer: Self-pay | Admitting: Family

## 2023-07-03 ENCOUNTER — Ambulatory Visit (INDEPENDENT_AMBULATORY_CARE_PROVIDER_SITE_OTHER): Payer: Medicare Other | Admitting: Family

## 2023-07-03 ENCOUNTER — Telehealth: Payer: Self-pay

## 2023-07-03 VITALS — BP 128/70 | HR 73 | Temp 97.7°F | Resp 17 | Ht 66.0 in | Wt 130.2 lb

## 2023-07-03 DIAGNOSIS — B182 Chronic viral hepatitis C: Secondary | ICD-10-CM

## 2023-07-03 DIAGNOSIS — J432 Centrilobular emphysema: Secondary | ICD-10-CM

## 2023-07-03 DIAGNOSIS — F17209 Nicotine dependence, unspecified, with unspecified nicotine-induced disorders: Secondary | ICD-10-CM

## 2023-07-03 DIAGNOSIS — F1010 Alcohol abuse, uncomplicated: Secondary | ICD-10-CM

## 2023-07-03 DIAGNOSIS — E876 Hypokalemia: Secondary | ICD-10-CM | POA: Diagnosis not present

## 2023-07-03 DIAGNOSIS — D509 Iron deficiency anemia, unspecified: Secondary | ICD-10-CM | POA: Diagnosis not present

## 2023-07-03 NOTE — Progress Notes (Signed)
 Provider: Richarda Blade FNP-C  Roneka Gilpin, Donalee Citrin, NP  Patient Care Team: Merisa Julio, Donalee Citrin, NP as PCP - General (Family Medicine) Kermit Balo, DO (Geriatric Medicine)  Extended Emergency Contact Information Primary Emergency Contact: [name],[name] Address: [address]          [place] [address] [place] Home Phone: [phone] Relation: Spouse Secondary Emergency Contact: Moore,Jonthan Mobile Phone: [phone] Relation: Son  Code Status:  Full Code  Goals of care: Advanced Directive information    07/03/2023   10:09 AM  Advanced Directives  Does Patient Have a Medical Advance Directive? No  Would patient like information on creating a medical advance directive? No - Patient declined     Chief Complaint  Patient presents with   Transitions Of [facility] follow up     Discussed the use of AI scribe software for clinical note transcription with the patient, who gave verbal consent to proceed.  History of Present Illness   Ryan Ortega is a 78 year old male who presents for a hospital follow-up after a sternum fracture.  He is here for a follow-up after being discharged from the hospital where he was treated for a sternum fracture. He feels better and denies any current pain in the sternum area or while breathing. A CT scan at the hospital revealed a displaced fracture on the lower side of the sternum with some surrounding swelling. He has been using a Lidoderm patch and attending physical therapy sessions. He is also using an incentive spirometer to aid in his recovery.  He has a history of COPD, and during his hospital stay, his oxygen levels were noted to be low. He was treated with Rocephin and Doxycycline, and his oxygen saturation has improved to 98% today. No current issues with breathing are reported.  He experienced an acute kidney injury during his hospital stay, with creatinine levels initially at 1.7, which improved to 1.0 with  hydration. His blood pressure was high at admission (187/96) but normalized to 123/61 by discharge.  His potassium levels were mildly low during hospitalization but improved by discharge. His troponin levels were initially elevated but normalized with repeated testing, attributed to breathing difficulties due to trauma. An echocardiogram showed normal heart function with an ejection fraction between 60-65%.  He has a history of hepatitis C and consumes alcohol, which can exacerbate liver damage. He continues to smoke, although he has not been using a nicotine patch. He is taking thiamine, folic acid, and multivitamins due to his alcohol use. No issues with acid reflux and reports a good appetite.    Past Medical History:  Diagnosis Date   Allergy    Chronic hepatitis C without mention of hepatic coma    GERD (gastroesophageal reflux disease)    H. pylori infection    Hearing loss of aging    Internal hemorrhoids without mention of complication    Lipoma of unspecified site    Loss of weight    Mild cognitive impairment, so stated    Osteoarthrosis, unspecified whether generalized or localized, unspecified site    Pain in joint, forearm    Personal history of colonic adenoma 06/25/2012   Rash and other nonspecific skin eruption    Routine general medical examination at a health care facility    Substance abuse (HCC)    Tinnitus of both ears    Tobacco use disorder    Unspecified visual loss    Past Surgical History:  Procedure Laterality Date   COLONOSCOPY     left shoulder  1990   POLYPECTOMY      No Known Allergies  Outpatient Encounter Medications as of 07/03/2023  Medication Sig   albuterol (VENTOLIN HFA) 108 (90 Base) MCG/ACT inhaler Inhale 2 puffs into the lungs every 6 (six) hours as needed for wheezing or shortness of breath.   cyanocobalamin (VITAMIN B12) 1000 MCG tablet Take 1 tablet (1,000 mcg total) by mouth daily.   folic acid (FOLVITE) 400 MCG tablet Take 1 tablet  (400 mcg total) by mouth daily.   guaiFENesin (MUCINEX) 600 MG 12 hr tablet Take 1 tablet (600 mg total) by mouth 2 (two) times daily for 15 days.   HYDROcodone-acetaminophen (NORCO/VICODIN) 5-325 MG tablet Take 1 tablet by mouth every 4 (four) hours as needed for moderate pain (pain score 4-6).   Multiple Vitamin (MULTIVITAMIN) TABS Take 1 tablet by mouth daily.   lidocaine (LIDODERM) 5 % Place 1 patch onto the skin daily. Remove & Discard patch within 12 hours or as directed by MD (Patient not taking: Reported on 07/03/2023)   mirtazapine (REMERON) 15 MG tablet TAKE 1 TABLET BY MOUTH EVERYDAY AT BEDTIME (Patient not taking: Reported on 06/23/2023)   nicotine (NICODERM CQ - DOSED IN MG/24 HOURS) 14 mg/24hr patch Place 1 patch (14 mg total) onto the skin daily. (Patient not taking: Reported on 07/03/2023)   thiamine (VITAMIN B-1) 100 MG tablet Take 1 tablet (100 mg total) by mouth daily. (Patient not taking: Reported on 07/03/2023)   No facility-administered encounter medications on file as of 07/03/2023.    Review of Systems  Constitutional:  Negative for appetite change, chills, fatigue, fever and unexpected weight change.  HENT:  Negative for congestion, dental problem, ear discharge, ear pain, facial swelling, hearing loss, nosebleeds, postnasal drip, rhinorrhea, sinus pressure, sinus pain, sneezing, sore throat, tinnitus and trouble swallowing.   Eyes:  Negative for pain, discharge, redness, itching and visual disturbance.  Respiratory:  Negative for cough, chest tightness, shortness of breath and wheezing.        Chest tenderness   Cardiovascular:  Negative for chest pain, palpitations and leg swelling.  Gastrointestinal:  Negative for abdominal distention, abdominal pain, blood in stool, constipation, diarrhea, nausea and vomiting.  Endocrine: Negative for cold intolerance, heat intolerance, polydipsia, polyphagia and polyuria.  Genitourinary:  Negative for difficulty urinating, dysuria, flank  pain, frequency and urgency.  Musculoskeletal:  Negative for arthralgias, back pain, gait problem, joint swelling, myalgias, neck pain and neck stiffness.  Skin:  Negative for color change, pallor, rash and wound.  Neurological:  Negative for dizziness, syncope, speech difficulty, weakness, light-headedness, numbness and headaches.  Hematological:  Does not bruise/bleed easily.  Psychiatric/Behavioral:  Negative for agitation, behavioral problems, confusion, hallucinations, self-injury, sleep disturbance and suicidal ideas. The patient is not nervous/anxious.     Immunization History  Administered Date(s) Administered   DTaP 07/08/1956   Fluad Quad(high Dose 65+) 03/19/2019, 03/23/2020, 05/25/2021   Fluad Trivalent(High Dose 65+) 06/02/2023   Hep A / Hep B 09/05/2014, 10/10/2014, 03/20/2015   Hepatitis B, ADULT 09/03/2017, 02/11/2018   Hepatitis B, PED/ADOLESCENT 07/29/2017   Influenza, High Dose Seasonal PF 02/17/2017, 01/22/2018   Influenza,inj,Quad PF,6+ Mos 03/24/2014, 03/17/2015, 12/21/2015   Moderna SARS-COV2 Booster Vaccination 04/24/2020   Moderna Sars-Covid-2 Vaccination 06/25/2019, 07/26/2019   Pneumococcal Conjugate-13 06/24/2014   Pneumococcal Polysaccharide-23 07/07/2009, 08/03/2012   Td 07/08/1952   Tdap 05/17/2013, 03/06/2018   Varicella 07/07/1957   Zoster, Live 07/07/2004  Pertinent  Health Maintenance Due  Topic Date Due   INFLUENZA VACCINE  Completed      05/16/2022   10:15 AM 09/10/2022   10:38 AM 11/27/2022   10:10 AM 06/02/2023   10:39 AM 07/03/2023   10:09 AM  Fall Risk  Falls in the past year? 0 0 0 1 1  Was there an injury with Fall? 0 0 0 0 1  Fall Risk Category Calculator 0 0 0 1 2  Patient at Risk for Falls Due to No Fall Risks No Fall Risks No Fall Risks  History of fall(s)  Fall risk Follow up Falls evaluation completed Falls evaluation completed Falls evaluation completed  Falls evaluation completed   Functional Status Survey:    Vitals:    07/03/23 1014  BP: 128/70  Pulse: 73  Resp: 17  Temp: 97.7 F (36.5 C)  SpO2: 98%  Weight: 130 lb 3.2 oz (59.1 kg)  Height: 5\' 6"  (1.676 m)   Body mass index is 21.01 kg/m. Physical Exam  VITALS: BP- 123/61, SaO2- 98% GENERAL: Alert, cooperative, well developed, no acute distress. HEENT: Normocephalic, normal oropharynx, moist mucous membranes. CHEST: Clear to auscultation bilaterally, no wheezes, rhonchi, or crackles. Mild tenderness on sternum, no bruises or ecchymosis tenderness on chest wall. CARDIOVASCULAR: Normal heart rate and rhythm, S1 and S2 normal without murmurs. ABDOMEN: Soft, non-tender, non-distended, without organomegaly, normal bowel sounds. EXTREMITIES: No cyanosis or edema. NEUROLOGICAL: Cranial nerves grossly intact, moves all extremities without gross motor or sensory deficit. PSYCHIATRY/BEHAVIORAL: Mood stable   Labs reviewed: Recent Labs    06/19/23 1148 06/20/23 0638 06/21/23 0656  NA 138 135 136  K 3.6 3.4* 4.0  CL 103 106 110  CO2 19* 23 18*  GLUCOSE 145* 94 71  BUN 17 19 12   CREATININE 1.74* 1.14 1.03  CALCIUM 9.7 8.2* 8.1*  MG  --  1.8  --   PHOS  --  2.8  --    Recent Labs    11/27/22 1040 06/02/23 1121 06/19/23 1642  AST 14 14 40  ALT 9 8* 19  ALKPHOS  --   --  68  BILITOT 1.1 1.4* 1.7*  PROT 7.9 8.2* 8.0  ALBUMIN  --   --  3.6   Recent Labs    11/27/22 1040 06/02/23 1121 06/19/23 1148 06/20/23 0638 06/21/23 0656  WBC 3.4* 8.6 10.3 7.2 6.5  NEUTROABS 1,319* 6,803  --   --   --   HGB 13.4 11.2* 12.2* 9.3* 8.9*  HCT 39.8 33.8* 39.2 28.2* 27.5*  MCV 91.9 85.1 88.3 84.4 85.1  PLT 172 187 270 190 185   Lab Results  Component Value Date   TSH 1.19 06/02/2023   Lab Results  Component Value Date   HGBA1C 4.6 07/27/2020   Lab Results  Component Value Date   CHOL 133 06/02/2023   HDL 79 06/02/2023   LDLCALC 41 06/02/2023   TRIG 53 06/02/2023   CHOLHDL 1.7 06/02/2023    Significant Diagnostic Results in last 30  days:  ECHOCARDIOGRAM COMPLETE Result Date: 06/20/2023    ECHOCARDIOGRAM REPORT   Patient Name:   Ryan Ortega Date of Exam: 06/20/2023 Medical Rec #:  [id number]       Height:       66.0 in Accession #:    [id number]      Weight:       135.0 lb Date of Birth:  [birth year]       BSA:  1.692 m Patient Age:    67 years        BP:           139/91 mmHg Patient Gender: M               HR:           85 bpm. Exam Location:  Inpatient Procedure: 2D Echo, Cardiac Doppler, Color Doppler and Intracardiac            Opacification Agent (Both Spectral and Color Flow Doppler were            utilized during procedure). Indications:    Chest Pain  History:        Patient has no prior history of Echocardiogram examinations.                 COPD; Risk Factors:Current Smoker.  Sonographer:    Karma Ganja Referring Phys: [id number] AOZHYQ POKHREL IMPRESSIONS  1. Left ventricular ejection fraction, by estimation, is 60 to 65%. The left ventricle has normal function. The left ventricle has no regional wall motion abnormalities. Left ventricular diastolic parameters were normal.  2. Right ventricular systolic function is normal. The right ventricular size is normal. There is normal pulmonary artery systolic pressure.  3. The mitral valve is normal in structure. No evidence of mitral valve regurgitation. No evidence of mitral stenosis.  4. The aortic valve is tricuspid. There is mild calcification of the aortic valve. Aortic valve regurgitation is trivial. Aortic valve sclerosis is present, with no evidence of aortic valve stenosis.  5. The inferior vena cava is normal in size with greater than 50% respiratory variability, suggesting right atrial pressure of 3 mmHg. FINDINGS  Left Ventricle: Left ventricular ejection fraction, by estimation, is 60 to 65%. The left ventricle has normal function. The left ventricle has no regional wall motion abnormalities. Definity contrast agent was given IV to delineate the left ventricular   endocardial borders. Strain was performed and the global longitudinal strain is indeterminate. The left ventricular internal cavity size was normal in size. There is no left ventricular hypertrophy. Left ventricular diastolic parameters were normal. Right Ventricle: The right ventricular size is normal. No increase in right ventricular wall thickness. Right ventricular systolic function is normal. There is normal pulmonary artery systolic pressure. The tricuspid regurgitant velocity is 2.11 m/s, and  with an assumed right atrial pressure of 3 mmHg, the estimated right ventricular systolic pressure is 20.8 mmHg. Left Atrium: Left atrial size was normal in size. Right Atrium: Right atrial size was normal in size. Pericardium: There is no evidence of pericardial effusion. Mitral Valve: The mitral valve is normal in structure. No evidence of mitral valve regurgitation. No evidence of mitral valve stenosis. Tricuspid Valve: The tricuspid valve is normal in structure. Tricuspid valve regurgitation is mild . No evidence of tricuspid stenosis. Aortic Valve: The aortic valve is tricuspid. There is mild calcification of the aortic valve. Aortic valve regurgitation is trivial. Aortic valve sclerosis is present, with no evidence of aortic valve stenosis. Aortic valve mean gradient measures 2.0 mmHg. Aortic valve peak gradient measures 3.5 mmHg. Aortic valve area, by VTI measures 2.24 cm. Pulmonic Valve: The pulmonic valve was normal in structure. Pulmonic valve regurgitation is trivial. No evidence of pulmonic stenosis. Aorta: The aortic root is normal in size and structure. Venous: The inferior vena cava is normal in size with greater than 50% respiratory variability, suggesting right atrial pressure of 3 mmHg. IAS/Shunts: The interatrial septum was not  well visualized. Additional Comments: 3D was performed not requiring image post processing on an independent workstation and was indeterminate.  LEFT VENTRICLE PLAX 2D LVIDd:          4.00 cm   Diastology LVIDs:         2.60 cm   LV e' medial:    10.40 cm/s LV PW:         1.10 cm   LV E/e' medial:  8.1 LV IVS:        1.00 cm   LV e' lateral:   9.79 cm/s LVOT diam:     1.90 cm   LV E/e' lateral: 8.6 LV SV:         36 LV SV Index:   21 LVOT Area:     2.84 cm  RIGHT VENTRICLE             IVC RV Basal diam:  3.50 cm     IVC diam: 1.20 cm RV S prime:     19.60 cm/s TAPSE (M-mode): 2.2 cm LEFT ATRIUM           Index        RIGHT ATRIUM           Index LA diam:      3.30 cm 1.95 cm/m   RA Area:     11.50 cm LA Vol (A4C): 57.7 ml 34.10 ml/m  RA Volume:   24.90 ml  14.71 ml/m  AORTIC VALVE AV Area (Vmax):    2.39 cm AV Area (Vmean):   2.13 cm AV Area (VTI):     2.24 cm AV Vmax:           94.10 cm/s AV Vmean:          61.500 cm/s AV VTI:            0.162 m AV Peak Grad:      3.5 mmHg AV Mean Grad:      2.0 mmHg LVOT Vmax:         79.40 cm/s LVOT Vmean:        46.100 cm/s LVOT VTI:          0.128 m LVOT/AV VTI ratio: 0.79  AORTA Ao Root diam: 3.40 cm MITRAL VALVE               TRICUSPID VALVE MV Area (PHT): 5.42 cm    TR Peak grad:   17.8 mmHg MV Decel Time: 140 msec    TR Vmax:        211.00 cm/s MV E velocity: 83.90 cm/s MV A velocity: 65.00 cm/s  SHUNTS MV E/A ratio:  1.29        Systemic VTI:  0.13 m                            Systemic Diam: 1.90 cm Charlton Haws MD Electronically signed by Charlton Haws MD Signature Date/Time: 06/20/2023/12:40:34 PM    Final    CT Chest Wo Contrast Result Date: 06/19/2023 CLINICAL DATA:  Chest trauma fall with chest pain EXAM: CT CHEST WITHOUT CONTRAST TECHNIQUE: Multidetector CT imaging of the chest was performed following the standard protocol without IV contrast. RADIATION DOSE REDUCTION: This exam was performed according to the departmental dose-optimization program which includes automated exposure control, adjustment of the mA and/or kV according to patient size and/or use of iterative reconstruction technique. COMPARISON:  Chest x-ray 06/19/2023,  CT chest 06/22/2019  FINDINGS: Cardiovascular: Limited evaluation without intravenous contrast. Moderate aortic atherosclerosis. No aneurysm. Coronary vascular calcifications. Normal cardiac size. No pericardial effusion Mediastinum/Nodes: Patent trachea. No thyroid mass. Borderline right paratracheal node measuring 9 mm. Left paratracheal node measuring 12 mm. Subcarinal lymph node measuring up to 11 mm. Esophagus within normal limits. Lungs/Pleura: No pleural effusion or pneumothorax. Heterogeneous airspace disease and consolidation with peribronchovascular thickening in the right middle lobe and right lower lobe. Upper Abdomen: No acute finding Musculoskeletal: Acute displaced lower sternal body fracture with surrounding stranding. Mild superior endplate deformity at T4 is age indeterminate but new since 2021 comparison CT. Mild gynecomastia IMPRESSION: 1. Acute displaced lower sternal body fracture with surrounding edema 2. Right middle and lower lobe consolidative airspace disease concerning for pneumonia, imaging follow-up to resolution recommended to exclude underlying mass particularly at right lung base. 3. Age indeterminate mild superior endplate deformity at T4, correlate for point tenderness 4. Mild mediastinal adenopathy is likely reactive Aortic Atherosclerosis (ICD10-I70.0). Electronically Signed   By: Jasmine Pang M.D.   On: 06/19/2023 15:57   DG Chest 2 View Result Date: 06/19/2023 CLINICAL DATA:  Shortness of breath, hypoxia, fall, chest pain EXAM: CHEST - 2 VIEW COMPARISON:  01/29/2017 FINDINGS: Heart and mediastinal contours within normal limits. Aortic atherosclerosis. Left lung clear. Right basilar airspace opacity. No effusions or pneumothorax. No acute bony abnormality. No visualized displaced rib fracture. IMPRESSION: Right basilar airspace opacity could reflect atelectasis or pneumonia. Electronically Signed   By: Charlett Nose M.D.   On: 06/19/2023 11:53    Assessment/Plan  Sternal  fracture Displaced fracture on the lower sternum with surrounding swelling. No pain reported in the sternum area or during respiration. The fracture is expected to heal spontaneously. Smoking cessation is advised to promote healing as smoking can impede the process. - Continue Lidoderm patch for pain management - Encourage use of incentive spirometer to promote lung expansion and healing - Advise smoking cessation to enhance healing - Encourage physical therapy exercises to maintain mobility and prevent complications  Chronic Obstructive Pulmonary Disease (COPD) COPD with previously low oxygen levels, now improved to 98%. Treated with robafen and doxycycline, which appears effective. - Encourage use of incentive spirometer to maintain lung function  Acute Kidney Injury Acute kidney injury with elevated creatinine levels improved from 1.7 to 1.0 with hydration.  Hepatitis C Hepatitis C with continued alcohol use, which can exacerbate liver damage. Advised to reduce alcohol consumption to prevent further liver damage. - Continue thiamine, folic acid, and multivitamins to prevent deficiencies due to alcohol use - Advise reduction of alcohol consumption to prevent further liver damage  Anemia Low hemoglobin levels indicating anemia, improved to 12.2. No signs of bruising or bleeding reported. - Order CBC to recheck hemoglobin levels  Hypertension Elevated blood pressure upon admission, normalized to 123/61 by discharge.  General Health Maintenance Due for a COVID-19 vaccine. Smoking and alcohol consumption are risk factors for existing conditions. Discussed smoking cessation options, including nicotine patches, gum, and Wellbutrin. - Advise obtaining COVID-19 vaccine at a pharmacy - Discuss smoking cessation options, including nicotine patches, gum, and Wellbutrin  Follow-up Scheduled follow-up appointments for Medicare and regular visits. - Follow up on May 30 for Medicare visit -  Follow up on August 18 for regular six-month visit   Family/ staff Communication: Reviewed plan of care with patient verbalized understand  Labs/tests ordered:  BMP CBC/diff   Next Appointment: Return if symptoms worsen or fail to improve.  Total time:30 minutes.Greater than 50% of total  time spent doing patient education regarding chronic medical condition,health maintenance including symptom/medication management.   Caesar Bookman, NP

## 2023-07-03 NOTE — Patient Outreach (Signed)
 Care Management  Transitions of Care Program Transitions of Care Post-discharge week 2  07/03/2023 Name: Ryan Ortega MRN: [id number] DOB: Jan 07, 1946  Subjective: Ryan Ortega is a 78 y.o. year old male who is a primary care patient of Ngetich, Dinah C, NP. The Care Management team was unable to reach the patient by phone to assess and address transitions of care needs.     Goals Addressed             This Visit's Progress    COMPLETED: TOC Care Plan - Patient will report no readmissions in the next 30 days       Current Barriers: Unable to reach x 3 - will close [facility] program 07/03/23 Medication management Knowledge deficit - Patient reports he does not read or write, wife attempted medication review and was unable to clearly state medications and said she will have son call this [facility] RN to review and states son is the one that reviewed the medication list from discharge summary - 06/24/23 Call from son who reported the patient has all of the prescription medications and he will pick up the OTC medications from the discharge list that was reviewed with son, Amir Fick  RNCM Clinical Goal(s): Unable to reach x 3 - will close [facility] program 07/03/23 - Goals not met Patient will work with the Care Management team over the next 30 days to address Transition of Care Barriers: Medication Management Patient will take all medications exactly as prescribed and will call provider for medication related questions  Wife/son will demonstrate understanding of rationale for each prescribed medication as evidenced by reported patient has medications in the home and is taking as per discharge medication list Patient will attend all scheduled medical appointments: PCP appointment is 07/03/23 - Per 06/24/23 Follow up call with son, he will call PCP office and let them know this appointment is a hospital follow up Interventions: Evaluation of current treatment plan related to  self management and patient's  adherence to plan as established by provider  Transitions of Care:  Goal Not met. Doctor Visits  - discussed the importance of doctor visits Contacted Health RN/OT/PT - Conference call was placed to [facility] who confirmed they received referral and PT will be scheduling initial visit - Per quick follow up call with son (who was working), Kelly Splinter on 06/24/23 He will follow up with [facility] to make sure they have his correct number (patient's wife gave the incorrect number) and son will see when they plan to do the start of care   Falls Interventions:  (Status:  Goal Not Met.) Short Term Goal Reviewed medications and discussed potential side effects of medications such as dizziness and frequent urination Advised patient of importance of notifying provider of falls Assessed patients knowledge of fall risk prevention secondary to previously provided education  Patient Goals/Self-Care Activities:Unable to reach x 3 - will close [facility] program 07/03/23 Participate in Transition of Care Program/Attend [facility] scheduled calls Notify RN Care Manager of TOC call rescheduling needs Take all medications as prescribed Attend all scheduled provider appointments  Follow Up Plan:  Unable to reach x 3 - will close [facility] program 07/03/23 The patient has been provided with contact information for the care management team and has been advised to call with any health related questions or concerns.             Plan: No further outreach attempts will be made at this time.  We have been unable to reach  the patient.  Hilbert Odor RN, CCM [place]  VBC[facility]-Population Health RN Care Manager [phone]

## 2023-07-04 LAB — CBC WITH DIFFERENTIAL/PLATELET
Absolute Lymphocytes: 1968 {cells}/uL (ref 850–3900)
Absolute Monocytes: 403 {cells}/uL (ref 200–950)
Basophils Absolute: 29 {cells}/uL (ref 0–200)
Basophils Relative: 0.6 %
Eosinophils Absolute: 91 {cells}/uL (ref 15–500)
Eosinophils Relative: 1.9 %
HCT: 31.1 % — ABNORMAL LOW (ref 38.5–50.0)
Hemoglobin: 9.8 g/dL — ABNORMAL LOW (ref 13.2–17.1)
MCH: 27.8 pg (ref 27.0–33.0)
MCHC: 31.5 g/dL — ABNORMAL LOW (ref 32.0–36.0)
MCV: 88.4 fL (ref 80.0–100.0)
MPV: 9.6 fL (ref 7.5–12.5)
Monocytes Relative: 8.4 %
Neutro Abs: 2309 {cells}/uL (ref 1500–7800)
Neutrophils Relative %: 48.1 %
Platelets: 386 10*3/uL (ref 140–400)
RBC: 3.52 10*6/uL — ABNORMAL LOW (ref 4.20–5.80)
RDW: 15.8 % — ABNORMAL HIGH (ref 11.0–15.0)
Total Lymphocyte: 41 %
WBC: 4.8 10*3/uL (ref 3.8–10.8)

## 2023-07-04 LAB — BASIC METABOLIC PANEL
BUN: 9 mg/dL (ref 7–25)
CO2: 24 mmol/L (ref 20–32)
Calcium: 9.8 mg/dL (ref 8.6–10.3)
Chloride: 106 mmol/L (ref 98–110)
Creat: 0.85 mg/dL (ref 0.70–1.28)
Glucose, Bld: 102 mg/dL — ABNORMAL HIGH (ref 65–99)
Potassium: 4.2 mmol/L (ref 3.5–5.3)
Sodium: 138 mmol/L (ref 135–146)
eGFR: 89 mL/min/{1.73_m2} (ref >=60)

## 2023-08-09 ENCOUNTER — Other Ambulatory Visit: Payer: Self-pay

## 2023-08-09 ENCOUNTER — Emergency Department (HOSPITAL_COMMUNITY)

## 2023-08-09 ENCOUNTER — Emergency Department (HOSPITAL_COMMUNITY)
Admission: EM | Admit: 2023-08-09 | Discharge: 2023-08-09 | Disposition: A | Discharge status: Home / Self Care | Attending: Emergency Medicine | Admitting: Emergency Medicine

## 2023-08-09 DIAGNOSIS — G319 Degenerative disease of nervous system, unspecified: Secondary | ICD-10-CM | POA: Diagnosis not present

## 2023-08-09 DIAGNOSIS — R001 Bradycardia, unspecified: Secondary | ICD-10-CM | POA: Diagnosis not present

## 2023-08-09 DIAGNOSIS — R231 Pallor: Secondary | ICD-10-CM | POA: Diagnosis not present

## 2023-08-09 DIAGNOSIS — S0990XA Unspecified injury of head, initial encounter: Secondary | ICD-10-CM | POA: Diagnosis not present

## 2023-08-09 DIAGNOSIS — I959 Hypotension, unspecified: Secondary | ICD-10-CM | POA: Diagnosis not present

## 2023-08-09 DIAGNOSIS — S199XXA Unspecified injury of neck, initial encounter: Secondary | ICD-10-CM | POA: Diagnosis not present

## 2023-08-09 DIAGNOSIS — W19XXXA Unspecified fall, initial encounter: Secondary | ICD-10-CM | POA: Insufficient documentation

## 2023-08-09 DIAGNOSIS — J439 Emphysema, unspecified: Secondary | ICD-10-CM | POA: Diagnosis not present

## 2023-08-09 DIAGNOSIS — Z043 Encounter for examination and observation following other accident: Secondary | ICD-10-CM | POA: Insufficient documentation

## 2023-08-09 NOTE — ED Triage Notes (Signed)
 Patient BIB EMS coming from home. Patient had fall going to the bathroom. Not on thinners, No LOC. Denies any pain at this time. EMS states patient was clammy and diaphoretic at scene.

## 2023-08-09 NOTE — ED Notes (Signed)
 Pt asked EMT-P to call son in reguards to a ride home. Pt son contacted and advised he would come pick up the pt in 15-30 minutes.

## 2023-08-09 NOTE — ED Provider Notes (Signed)
 [place] [facility] DEPARTMENT AT [facility] Provider Note   CSN: [id number] Arrival date & time: 08/09/23  0142     History  Chief Complaint  Patient presents with   Ryan Ortega    Ryan Ortega is a 78 y.o. male.  The history is provided by the patient.  Fall This is a new problem. The current episode started less than 1 hour ago. The problem occurs rarely. The problem has been resolved. Pertinent negatives include no chest pain, no abdominal pain, no headaches and no shortness of breath. Nothing aggravates the symptoms. Nothing relieves the symptoms. He has tried nothing for the symptoms. The treatment provided no relief.       Home Medications Prior to Admission medications   Medication Sig Start Date End Date Taking? Authorizing Provider  albuterol  (VENTOLIN  HFA) 108 (90 Base) MCG/ACT inhaler Inhale 2 puffs into the lungs every 6 (six) hours as needed for wheezing or shortness of breath. 06/21/23   Pokhrel, Amador Bad, MD  cyanocobalamin  (VITAMIN B12) 1000 MCG tablet Take 1 tablet (1,000 mcg total) by mouth daily. 06/02/23   Ngetich, Dinah C, NP  folic acid  (FOLVITE ) 400 MCG tablet Take 1 tablet (400 mcg total) by mouth daily. 06/21/23   Pokhrel, Laxman, MD  HYDROcodone -acetaminophen  (NORCO/VICODIN) 5-325 MG tablet Take 1 tablet by mouth every 4 (four) hours as needed for moderate pain (pain score 4-6). 06/24/23   Ngetich, Dinah C, NP  Multiple Vitamin (MULTIVITAMIN) TABS Take 1 tablet by mouth daily. 06/02/23   Ngetich, Dinah C, NP  thiamine  (VITAMIN B-1) 100 MG tablet Take 1 tablet (100 mg total) by mouth daily. Patient not taking: Reported on 07/03/2023 06/21/23   Pokhrel, Laxman, MD      Allergies    Patient has no known allergies.    Review of Systems   Review of Systems  Constitutional:  Negative for fever.  Eyes:  Negative for photophobia.  Respiratory:  Negative for shortness of breath.   Cardiovascular:  Negative for chest pain.  Gastrointestinal:  Negative for  abdominal pain.  Genitourinary:  Negative for penile pain.  Neurological:  Negative for headaches.  Psychiatric/Behavioral:  Negative for agitation.   All other systems reviewed and are negative.   Physical Exam Updated Vital Signs BP (!) 110/48 (BP Location: Left Arm)   Pulse 64   Temp (!) 97.4 F (36.3 C) (Oral)   Resp 19   Ht 5\' 6"  (1.676 m)   Wt 63.5 kg   SpO2 96%   BMI 22.60 kg/m  Physical Exam Vitals and nursing note reviewed.  Constitutional:      General: He is not in acute distress.    Appearance: Normal appearance. He is well-developed. He is not diaphoretic.  HENT:     Head: Normocephalic and atraumatic.     Nose: Nose normal.  Eyes:     Conjunctiva/sclera: Conjunctivae normal.     Pupils: Pupils are equal, round, and reactive to light.  Cardiovascular:     Rate and Rhythm: Normal rate and regular rhythm.     Pulses: Normal pulses.     Heart sounds: Normal heart sounds.  Pulmonary:     Effort: Pulmonary effort is normal.     Breath sounds: Normal breath sounds. No wheezing or rales.  Abdominal:     General: Bowel sounds are normal.     Palpations: Abdomen is soft.     Tenderness: There is no abdominal tenderness. There is no guarding or rebound.  Musculoskeletal:  General: Normal range of motion.     Cervical back: Normal range of motion and neck supple.  Skin:    General: Skin is warm and dry.     Capillary Refill: Capillary refill takes less than 2 seconds.  Neurological:     General: No focal deficit present.     Mental Status: He is alert and oriented to person, place, and time.     Deep Tendon Reflexes: Reflexes normal.  Psychiatric:        Mood and Affect: Mood normal.        Behavior: Behavior normal.     ED Results / Procedures / Treatments   Labs (all labs ordered are listed, but only abnormal results are displayed) Labs Reviewed - No data to display  EKG None  Radiology CT Head Wo Contrast Result Date: 08/09/2023 CLINICAL  DATA:  Blunt poly trauma EXAM: CT HEAD WITHOUT CONTRAST CT CERVICAL SPINE WITHOUT CONTRAST TECHNIQUE: Multidetector CT imaging of the head and cervical spine was performed following the standard protocol without intravenous contrast. Multiplanar CT image reconstructions of the cervical spine were also generated. RADIATION DOSE REDUCTION: This exam was performed according to the departmental dose-optimization program which includes automated exposure control, adjustment of the mA and/or kV according to patient size and/or use of iterative reconstruction technique. COMPARISON:  03/06/2018 FINDINGS: CT HEAD FINDINGS Brain: No evidence of acute infarction, hemorrhage, hydrocephalus, extra-axial collection or mass lesion/mass effect. Generalized brain atrophy Vascular: No hyperdense vessel or unexpected calcification. Skull: Normal. Negative for fracture or focal lesion. Sinuses/Orbits: Remote blowout fracture of the bilateral medial orbit. CT CERVICAL SPINE FINDINGS Alignment: Hyperlordosis and mild scoliosis. No traumatic malalignment Skull base and vertebrae: No acute fracture. No primary bone lesion or focal pathologic process. Soft tissues and spinal canal: No prevertebral fluid or swelling. No visible canal hematoma. Disc levels: Prominent generalized disc degeneration with endplate irregularity and sclerosis. Upper chest: Emphysematous change.  No acute finding IMPRESSION: No evidence of acute intracranial or cervical spine injury. Electronically Signed   By: Ronnette Coke M.D.   On: 08/09/2023 05:49   CT Cervical Spine Wo Contrast Result Date: 08/09/2023 CLINICAL DATA:  Blunt poly trauma EXAM: CT HEAD WITHOUT CONTRAST CT CERVICAL SPINE WITHOUT CONTRAST TECHNIQUE: Multidetector CT imaging of the head and cervical spine was performed following the standard protocol without intravenous contrast. Multiplanar CT image reconstructions of the cervical spine were also generated. RADIATION DOSE REDUCTION: This exam  was performed according to the departmental dose-optimization program which includes automated exposure control, adjustment of the mA and/or kV according to patient size and/or use of iterative reconstruction technique. COMPARISON:  03/06/2018 FINDINGS: CT HEAD FINDINGS Brain: No evidence of acute infarction, hemorrhage, hydrocephalus, extra-axial collection or mass lesion/mass effect. Generalized brain atrophy Vascular: No hyperdense vessel or unexpected calcification. Skull: Normal. Negative for fracture or focal lesion. Sinuses/Orbits: Remote blowout fracture of the bilateral medial orbit. CT CERVICAL SPINE FINDINGS Alignment: Hyperlordosis and mild scoliosis. No traumatic malalignment Skull base and vertebrae: No acute fracture. No primary bone lesion or focal pathologic process. Soft tissues and spinal canal: No prevertebral fluid or swelling. No visible canal hematoma. Disc levels: Prominent generalized disc degeneration with endplate irregularity and sclerosis. Upper chest: Emphysematous change.  No acute finding IMPRESSION: No evidence of acute intracranial or cervical spine injury. Electronically Signed   By: Ronnette Coke M.D.   On: 08/09/2023 05:49    Procedures Procedures    Medications Ordered in ED Medications - No data to display  ED Course/ Medical Decision Making/ A&P                                 Medical Decision Making Fall on way to the bathroom   Amount and/or Complexity of Data Reviewed External Data Reviewed: notes.    Details: Previous notes reviewed  Radiology: ordered and independent interpretation performed.    Details: Negative head CT   Risk Risk Details: No pain or injuries.      Final Clinical Impression(s) / ED Diagnoses Final diagnoses:  Fall, initial encounter   No signs of systemic illness or infection. The patient is nontoxic-appearing on exam and vital signs are within normal limits.  I have reviewed the triage vital signs and the nursing  notes. Pertinent labs & imaging results that were available during my care of the patient were reviewed by me and considered in my medical decision making (see chart for details). After history, exam, and medical workup I feel the patient has been appropriately medically screened and is safe for discharge home. Pertinent diagnoses were discussed with the patient. Patient was given return precautions.      Rx / DC Orders ED Discharge Orders     None         Eliam Snapp, MD 08/09/23 1610

## 2023-09-12 ENCOUNTER — Encounter: Payer: Medicare Other | Admitting: Family

## 2023-10-27 ENCOUNTER — Encounter: Payer: Self-pay | Admitting: Podiatry

## 2023-10-27 ENCOUNTER — Ambulatory Visit: Admitting: Podiatry

## 2023-10-27 DIAGNOSIS — B351 Tinea unguium: Secondary | ICD-10-CM | POA: Diagnosis not present

## 2023-10-27 DIAGNOSIS — M79675 Pain in left toe(s): Secondary | ICD-10-CM | POA: Diagnosis not present

## 2023-10-27 DIAGNOSIS — M79674 Pain in right toe(s): Secondary | ICD-10-CM | POA: Diagnosis not present

## 2023-10-27 NOTE — Progress Notes (Signed)
 This patient presents to the office with chief complaint of long thick painful nails.  Patient says the nails are painful walking and wearing shoes.  This patient is unable to self treat.  This patient is unable to trim her nails since she is unable to reach her nails.  She presents to the office for preventative foot care services. Patient has not been seen in about  10 months .  General Appearance  Alert, conversant and in no acute stress.  Vascular  Dorsalis pedis and posterior tibial  pulses are palpable  bilaterally.  Capillary return is within normal limits  bilaterally. Temperature is within normal limits  bilaterally.  Neurologic  Senn-Weinstein monofilament wire test within normal limits  bilaterally. Muscle power within normal limits bilaterally.  Nails Thick disfigured discolored nails with subungual debris  from hallux to fifth toes bilaterally. No evidence of bacterial infection or drainage bilaterally.  Orthopedic  No limitations of motion  feet .  No crepitus or effusions noted.  No bony pathology or digital deformities noted.  Skin  normotropic skin with no porokeratosis noted bilaterally.  No signs of infections or ulcers noted.     Onychomycosis  Nails  B/L.  Pain in right toes  Pain in left toes  Debridement of nails both feet followed trimming the nails with dremel tool.    RTC  3 months    Cordella Bold DPM

## 2023-11-12 ENCOUNTER — Telehealth: Payer: Self-pay

## 2023-11-12 MED ORDER — ALBUTEROL SULFATE HFA 108 (90 BASE) MCG/ACT IN AERS: 2.0000 | INHALATION_SPRAY | Freq: Four times a day (QID) | RESPIRATORY_TRACT | 0 refills | Status: AC | PRN

## 2023-11-12 NOTE — Telephone Encounter (Signed)
 Copied from CRM [id number]. Topic: Clinical - Medication Question >> Nov 12, 2023  3:01 PM Cherylann S wrote: Reason for CRM: Patient's wife called and stated that patient seems like he is breathing too hard when he sleeps and would like to know if he could get a refill on the albuterol  (VENTOLIN  HFA) 108 (90 Base) MCG/ACT inhaler as she believes this will help him. Mrs. Cartlidge is requesting a callback regarding the refill and next steps.   I have attempted to contact this patient by telephone, but there is no answer repeatedly. I will continue to try later. Medication has been sent to the pharmacy. FYI Ngetich, Roxan BROCKS, NP has been notified  Message sent to [employer], Roxan BROCKS, NP

## 2023-11-12 NOTE — Telephone Encounter (Signed)
 Okay to refill Albuterol  as requested.schedule office visit appointment if symptoms not resolve with albuterol .

## 2023-12-01 ENCOUNTER — Encounter: Payer: Self-pay | Admitting: Family

## 2023-12-01 ENCOUNTER — Ambulatory Visit (INDEPENDENT_AMBULATORY_CARE_PROVIDER_SITE_OTHER): Payer: Medicare Other | Admitting: Family

## 2023-12-01 VITALS — BP 112/60 | HR 85 | Temp 97.7°F | Resp 18 | Ht 66.0 in | Wt 122.2 lb

## 2023-12-01 DIAGNOSIS — Z13220 Encounter for screening for lipoid disorders: Secondary | ICD-10-CM | POA: Diagnosis not present

## 2023-12-01 DIAGNOSIS — J432 Centrilobular emphysema: Secondary | ICD-10-CM

## 2023-12-01 DIAGNOSIS — R634 Abnormal weight loss: Secondary | ICD-10-CM

## 2023-12-01 DIAGNOSIS — I2510 Atherosclerotic heart disease of native coronary artery without angina pectoris: Secondary | ICD-10-CM

## 2023-12-01 DIAGNOSIS — D509 Iron deficiency anemia, unspecified: Secondary | ICD-10-CM | POA: Diagnosis not present

## 2023-12-01 DIAGNOSIS — F1010 Alcohol abuse, uncomplicated: Secondary | ICD-10-CM

## 2023-12-01 DIAGNOSIS — K219 Gastro-esophageal reflux disease without esophagitis: Secondary | ICD-10-CM

## 2023-12-01 MED ORDER — ENSURE ACTIVE HIGH PROTEIN PO LIQD: 1.0000 | Freq: Every day | ORAL | 5 refills | Status: AC

## 2023-12-01 MED ORDER — MIRTAZAPINE 15 MG PO TABS: 15.0000 mg | ORAL_TABLET | Freq: Every day | ORAL | 1 refills | Status: DC

## 2023-12-01 MED ORDER — FOLIC ACID 400 MCG PO TABS
400.0000 ug | ORAL_TABLET | Freq: Every day | ORAL | 1 refills | Status: AC
Start: 2023-12-01 — End: ?

## 2023-12-01 NOTE — Progress Notes (Signed)
 Provider: Roxan Plough FNP-C   Ryan Ortega, Roxan BROCKS, NP  Patient Care Team: Koryn Charlot, Roxan BROCKS, NP as PCP - General (Family Medicine) Cloria Annabella CROME, DO (Geriatric Medicine)  Extended Emergency Contact Information Primary Emergency Contact: [name],[name] Address: [address]          [place] [address] [place] Home Phone: [phone] Relation: Spouse Secondary Emergency Contact: Moore,Romelo Mobile Phone: [phone] Relation: Son  Code Status:  Full Code  Goals of care: Advanced Directive information    12/01/2023   10:45 AM  Advanced Directives  Does Patient Have a Medical Advance Directive? No  Would patient like information on creating a medical advance directive? No - Patient declined     Chief Complaint  Patient presents with   Medical Management of Chronic Issues    6 month follow up    Discussed the use of AI scribe software for clinical note transcription with the patient, who gave verbal consent to proceed.  History of Present Illness   Ryan Ortega is a 78 year old male who presents for a six-month follow-up.  He has experienced weight loss, currently weighing 122.2 pounds, down from 130 pounds at the last visit. He denies any changes in diet and states he eats about two meals a day, confirming he is eating well and getting enough protein.  He continues to smoke at the same rate as before and drinks beer. He has not experienced any falls, anxiety, or depression. He reports sleeping well at night.  He is currently taking B12, a daily multivitamin, mirtazapine  at bedtime, and ibuprofen. He has stopped taking folic acid  and requires a refill. He has an albuterol  inhaler at home, which was refilled a month ago. No shortness of breath or cough.  No pain from a previous sternum fracture. No issues with constipation, diarrhea, or blood in urine or stool. No pain or tenderness in the abdomen.  He mentions that his skin on the legs is dry and  uses Vaseline, although he does not prefer it. No issues with chewing food despite not having dentures.   Past Medical History:  Diagnosis Date   Allergy    Chronic hepatitis C without mention of hepatic coma    GERD (gastroesophageal reflux disease)    H. pylori infection    Hearing loss of aging    Internal hemorrhoids without mention of complication    Lipoma of unspecified site    Loss of weight    Mild cognitive impairment, so stated    Osteoarthrosis, unspecified whether generalized or localized, unspecified site    Pain in joint, forearm    Personal history of colonic adenoma 06/25/2012   Rash and other nonspecific skin eruption    Routine general medical examination at a health care facility    Substance abuse (HCC)    Tinnitus of both ears    Tobacco use disorder    Unspecified visual loss    Past Surgical History:  Procedure Laterality Date   COLONOSCOPY     left shoulder  1990   POLYPECTOMY      No Known Allergies  Allergies as of 12/01/2023   No Known Allergies      Medication List        Accurate as of December 01, 2023 11:20 AM. If you have any questions, ask your nurse or doctor.          STOP taking these medications    HYDROcodone -acetaminophen  5-325 MG tablet Commonly known  as: NORCO/VICODIN Stopped by: Laramie Gelles C Maddalena Linarez       TAKE these medications    albuterol  108 (90 Base) MCG/ACT inhaler Commonly known as: VENTOLIN  HFA Inhale 2 puffs into the lungs every 6 (six) hours as needed for wheezing or shortness of breath.   B-12 1000 MCG Tabs Take 1 tablet (1,000 mcg total) by mouth daily.   Daily-Vite Multivitamin Tabs Take 1 tablet by mouth daily.   Ensure Active High Protein Liqd Take 1 Can by mouth daily after breakfast. Started by: Jonathyn Carothers C Amely Voorheis   folic acid  400 MCG tablet Commonly known as: FOLVITE  Take 1 tablet (400 mcg total) by mouth daily.   ibuprofen 200 MG tablet Commonly known as: ADVIL Take 200 mg by mouth as  needed.   mirtazapine  15 MG tablet Commonly known as: REMERON  Take 1 tablet (15 mg total) by mouth at bedtime.   thiamine  100 MG tablet Commonly known as: Vitamin B-1 Take 1 tablet (100 mg total) by mouth daily.        Review of Systems  Constitutional:  Positive for unexpected weight change. Negative for appetite change, chills, fatigue and fever.  HENT:  Negative for congestion, dental problem, ear discharge, ear pain, facial swelling, hearing loss, nosebleeds, postnasal drip, rhinorrhea, sinus pressure, sinus pain, sneezing, sore throat, tinnitus and trouble swallowing.        No dentures  Eyes:  Negative for pain, discharge, redness, itching and visual disturbance.  Respiratory:  Negative for cough, chest tightness, shortness of breath and wheezing.   Cardiovascular:  Negative for chest pain, palpitations and leg swelling.  Gastrointestinal:  Negative for abdominal distention, abdominal pain, blood in stool, constipation, diarrhea, nausea and vomiting.  Endocrine: Negative for cold intolerance, heat intolerance, polydipsia, polyphagia and polyuria.  Genitourinary:  Negative for difficulty urinating, dysuria, flank pain, frequency and urgency.  Musculoskeletal:  Negative for arthralgias, back pain, gait problem, joint swelling, myalgias, neck pain and neck stiffness.  Skin:  Negative for color change, pallor, rash and wound.  Neurological:  Negative for dizziness, syncope, speech difficulty, weakness, light-headedness, numbness and headaches.  Hematological:  Does not bruise/bleed easily.  Psychiatric/Behavioral:  Negative for agitation, behavioral problems, confusion, hallucinations, self-injury, sleep disturbance and suicidal ideas. The patient is not nervous/anxious.     Immunization History  Administered Date(s) Administered   DTaP 07/08/1956   Fluad Quad(high Dose 65+) 03/19/2019, 03/23/2020, 05/25/2021   Fluad Trivalent(High Dose 65+) 06/02/2023   Hep A / Hep B  09/05/2014, 10/10/2014, 03/20/2015   Hepatitis B, ADULT 09/03/2017, 02/11/2018   Hepatitis B, PED/ADOLESCENT 07/29/2017   Influenza, High Dose Seasonal PF 02/17/2017, 01/22/2018   Influenza,inj,Quad PF,6+ Mos 03/24/2014, 03/17/2015, 12/21/2015   Moderna SARS-COV2 Booster Vaccination 04/24/2020   Moderna Sars-Covid-2 Vaccination 06/25/2019, 07/26/2019   Pneumococcal Conjugate-13 06/24/2014   Pneumococcal Polysaccharide-23 07/07/2009, 08/03/2012   Td 07/08/1952   Tdap 05/17/2013, 03/06/2018   Varicella 07/07/1957   Zoster, Live 07/07/2004   Pertinent  Health Maintenance Due  Topic Date Due   INFLUENZA VACCINE  01/01/2024 (Originally 11/14/2023)      09/10/2022   10:38 AM 11/27/2022   10:10 AM 06/02/2023   10:39 AM 07/03/2023   10:09 AM 12/01/2023   10:45 AM  Fall Risk  Falls in the past year? 0 0 1 1 0  Was there an injury with Fall? 0 0 0 1 0  Fall Risk Category Calculator 0 0 1 2 0  Patient at Risk for Falls Due to No Fall Risks No Fall  Risks  History of fall(s) No Fall Risks  Fall risk Follow up Falls evaluation completed Falls evaluation completed  Falls evaluation completed Falls evaluation completed   Functional Status Survey:    Vitals:   12/01/23 1048  BP: 112/60  Pulse: 85  Resp: 18  Temp: 97.7 F (36.5 C)  SpO2: 98%  Weight: 122 lb 3.2 oz (55.4 kg)  Height: 5' 6 (1.676 m)   Body mass index is 19.72 kg/m. Physical Exam  VITALS: T- 97.7, P- 85, BP- 112/60, SaO2- 98% MEASUREMENTS: Weight- 122.2. GENERAL: Alert, cooperative, well developed, no acute distress HEENT: Normocephalic, normal oropharynx, moist mucous membranes, tympanic membranes normal bilaterally, pupils equal, round, and reactive to light, no sinus tenderness CHEST: Clear to auscultation bilaterally, no wheezes, rhonchi, or crackles CARDIOVASCULAR: Normal heart rate and rhythm, S1 and S2 normal without murmurs ABDOMEN: Soft, non-tender, non-distended, without organomegaly, normal bowel  sounds EXTREMITIES: No cyanosis or edema NEUROLOGICAL: Cranial nerves grossly intact, moves all extremities without gross motor or sensory deficit SKIN: Dry skin on legs  No rash,no lesion or erythema  PSYCHIATRY/BEHAVIORAL: Mood stable    Labs reviewed: Recent Labs    06/20/23 0638 06/21/23 0656 07/03/23 1044  NA 135 136 138  K 3.4* 4.0 4.2  CL 106 110 106  CO2 23 18* 24  GLUCOSE 94 71 102*  BUN 19 12 9   CREATININE 1.14 1.03 0.85  CALCIUM 8.2* 8.1* 9.8  MG 1.8  --   --   PHOS 2.8  --   --    Recent Labs    06/02/23 1121 06/19/23 1642  AST 14 40  ALT 8* 19  ALKPHOS  --  68  BILITOT 1.4* 1.7*  PROT 8.2* 8.0  ALBUMIN  --  3.6   Recent Labs    06/02/23 1121 06/19/23 1148 06/20/23 0638 06/21/23 0656 07/03/23 1044  WBC 8.6   < > 7.2 6.5 4.8  NEUTROABS 6,803  --   --   --  2,309  HGB 11.2*   < > 9.3* 8.9* 9.8*  HCT 33.8*   < > 28.2* 27.5* 31.1*  MCV 85.1   < > 84.4 85.1 88.4  PLT 187   < > 190 185 386   < > = values in this interval not displayed.   Lab Results  Component Value Date   TSH 1.19 06/02/2023   Lab Results  Component Value Date   HGBA1C 4.6 07/27/2020   Lab Results  Component Value Date   CHOL 133 06/02/2023   HDL 79 06/02/2023   LDLCALC 41 06/02/2023   TRIG 53 06/02/2023   CHOLHDL 1.7 06/02/2023    Significant Diagnostic Results in last 30 days:  No results found.  Assessment/Plan  Unintentional weight loss with anorexia Unintentional weight loss with current weight at 122.2 lbs, down from 130 lbs. Eating two meals a day and maintaining adequate protein intake. Mirtazapine  is being used to help with appetite and weight gain. - Continue mirtazapine  at bedtime to aid in appetite and weight gain. - Encourage intake of protein supplements like Ensure to stabilize weight. - Send prescription for protein supplement to [facility] to check insurance coverage. - Recheck CBC to assess hemoglobin levels and rule out anemia.  Emphysema  Albuterol   inhaler was refilled on July 30th and is available for use.  Vitamin B12 deficiency Vitamin B12 supplementation is ongoing with no new issues reported.  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 Currently using  Vaseline but advised to consider cream for better moisture retention. - Recommend using cream for better moisture retention. - Encourage increased water intake to improve skin hydration.  Tobacco use Continues to smoke at the same rate. Declined assistance to quit smoking but advised to try and cut down.  Alcohol use Continues to consume beer. Liver function and other related parameters to be monitored due to alcohol use.Not currently taking folic acid . Requires refill for continued supplementation. - Refill folic acid  prescription - Recheck CMP to assess kidney, liver function, and electrolytes.  Family/ staff Communication: Reviewed plan of care with patient verbalized understanding   Labs/tests ordered: - CBC with Differential/Platelet - CMP with eGFR([facility]) - TSH - Lipid panel   Next Appointment : Return in about 6 months (around 06/02/2024) for medical mangement of chronic issues., Annual wellness visit soon.   Spent 30 minutes of Face to face and non-face to face with patient  >50% time spent counseling; reviewing medical record; tests; labs; documentation and developing future plan of care.   Roxan JAYSON Plough, NP

## 2023-12-01 NOTE — Patient Instructions (Signed)
 1.Patient will go to local pharmacy and receive Covid vaccine. 2. Stop by check out and schedule Annual Wellness Visit.

## 2023-12-02 LAB — CBC WITH DIFFERENTIAL/PLATELET
Absolute Lymphocytes: 1778 {cells}/uL (ref 850–3900)
Absolute Monocytes: 410 {cells}/uL (ref 200–950)
Basophils Absolute: 32 {cells}/uL (ref 0–200)
Basophils Relative: 0.7 %
Eosinophils Absolute: 180 {cells}/uL (ref 15–500)
Eosinophils Relative: 4 %
HCT: 30.1 % — ABNORMAL LOW (ref 38.5–50.0)
Hemoglobin: 9.1 g/dL — ABNORMAL LOW (ref 13.2–17.1)
MCH: 26.3 pg — ABNORMAL LOW (ref 27.0–33.0)
MCHC: 30.2 g/dL — ABNORMAL LOW (ref 32.0–36.0)
MCV: 87 fL (ref 80.0–100.0)
MPV: 10.7 fL (ref 7.5–12.5)
Monocytes Relative: 9.1 %
Neutro Abs: 2102 {cells}/uL (ref 1500–7800)
Neutrophils Relative %: 46.7 %
Platelets: 256 Thousand/uL (ref 140–400)
RBC: 3.46 Million/uL — ABNORMAL LOW (ref 4.20–5.80)
RDW: 16.4 % — ABNORMAL HIGH (ref 11.0–15.0)
Total Lymphocyte: 39.5 %
WBC: 4.5 Thousand/uL (ref 3.8–10.8)

## 2023-12-02 LAB — LIPID PANEL
Cholesterol: 138 mg/dL (ref <200)
HDL: 69 mg/dL (ref >=40)
LDL Cholesterol (Calc): 56 mg/dL
Non-HDL Cholesterol (Calc): 69 mg/dL (ref <130)
Total CHOL/HDL Ratio: 2 (calc) (ref <5.0)
Triglycerides: 52 mg/dL (ref <150)

## 2023-12-02 LAB — COMPREHENSIVE METABOLIC PANEL WITH GFR
AG Ratio: 1.4 (calc) (ref 1.0–2.5)
ALT: 10 U/L (ref 9–46)
AST: 17 U/L (ref 10–35)
Albumin: 4.1 g/dL (ref 3.6–5.1)
Alkaline phosphatase (APISO): 64 U/L (ref 35–144)
BUN/Creatinine Ratio: 10 (calc) (ref 6–22)
BUN: 7 mg/dL (ref 7–25)
CO2: 24 mmol/L (ref 20–32)
Calcium: 9.3 mg/dL (ref 8.6–10.3)
Chloride: 107 mmol/L (ref 98–110)
Creat: 0.67 mg/dL — ABNORMAL LOW (ref 0.70–1.28)
Globulin: 3 g/dL (ref 1.9–3.7)
Glucose, Bld: 96 mg/dL (ref 65–99)
Potassium: 3.9 mmol/L (ref 3.5–5.3)
Sodium: 141 mmol/L (ref 135–146)
Total Bilirubin: 0.7 mg/dL (ref 0.2–1.2)
Total Protein: 7.1 g/dL (ref 6.1–8.1)
eGFR: 95 mL/min/1.73m2 (ref >=60)

## 2023-12-02 LAB — TSH: TSH: 0.96 m[IU]/L (ref 0.40–4.50)

## 2023-12-11 ENCOUNTER — Encounter: Payer: Self-pay | Admitting: Family

## 2023-12-11 ENCOUNTER — Ambulatory Visit: Payer: Self-pay | Admitting: Family

## 2023-12-11 ENCOUNTER — Ambulatory Visit (INDEPENDENT_AMBULATORY_CARE_PROVIDER_SITE_OTHER): Admitting: Family

## 2023-12-11 VITALS — BP 116/70 | HR 80 | Temp 97.7°F | Resp 18 | Ht 66.0 in | Wt 121.2 lb

## 2023-12-11 DIAGNOSIS — Z0000 Encounter for general adult medical examination without abnormal findings: Secondary | ICD-10-CM

## 2023-12-11 NOTE — Progress Notes (Signed)
 Subjective:   Ryan Ortega is a 78 y.o. male who presents for Medicare Annual/Subsequent preventive examination.  Visit Complete: In person  Patient Medicare AWV questionnaire was completed by the patient on 12/11/2023; I have confirmed that all information answered by patient is correct and no changes since this date.  Cardiac Risk Factors include: advanced age (>48men, >17 women);male gender;smoking/ tobacco exposure     Objective:    Today's Vitals   12/11/23 1037  BP: 116/70  Pulse: 80  Resp: 18  Temp: 97.7 F (36.5 C)  SpO2: 99%  Weight: 121 lb 3.2 oz (55 kg)  Height: 5' 6 (1.676 m)   Body mass index is 19.56 kg/m.     12/11/2023   10:27 AM 12/01/2023   10:45 AM 08/09/2023    2:16 AM 07/03/2023   10:09 AM 06/19/2023   12:03 PM 06/02/2023   10:39 AM 11/27/2022    8:54 AM  Advanced Directives  Does Patient Have a Medical Advance Directive? No No No No No No No  Would patient like information on creating a medical advance directive? No - Patient declined No - Patient declined No - Patient declined No - Patient declined No - Patient declined No - Patient declined No - Patient declined    Current Medications (verified) Outpatient Encounter Medications as of 12/11/2023  Medication Sig   albuterol  (VENTOLIN  HFA) 108 (90 Base) MCG/ACT inhaler Inhale 2 puffs into the lungs every 6 (six) hours as needed for wheezing or shortness of breath.   cyanocobalamin  (VITAMIN B12) 1000 MCG tablet Take 1 tablet (1,000 mcg total) by mouth daily.   folic acid  (FOLVITE ) 400 MCG tablet Take 1 tablet (400 mcg total) by mouth daily.   ibuprofen (ADVIL) 200 MG tablet Take 200 mg by mouth as needed.   mirtazapine  (REMERON ) 15 MG tablet Take 1 tablet (15 mg total) by mouth at bedtime.   Multiple Vitamin (MULTIVITAMIN) TABS Take 1 tablet by mouth daily.   Nutritional Supplements (ENSURE ACTIVE HIGH PROTEIN) LIQD Take 1 Can by mouth daily after breakfast.   thiamine  (VITAMIN B-1) 100 MG tablet  Take 1 tablet (100 mg total) by mouth daily.   No facility-administered encounter medications on file as of 12/11/2023.    Allergies (verified) Patient has no known allergies.   History: Past Medical History:  Diagnosis Date   Allergy    Chronic hepatitis C without mention of hepatic coma    GERD (gastroesophageal reflux disease)    H. pylori infection    Hearing loss of aging    Internal hemorrhoids without mention of complication    Lipoma of unspecified site    Loss of weight    Mild cognitive impairment, so stated    Osteoarthrosis, unspecified whether generalized or localized, unspecified site    Pain in joint, forearm    Personal history of colonic adenoma 06/25/2012   Rash and other nonspecific skin eruption    Routine general medical examination at a health care facility    Substance abuse (HCC)    Tinnitus of both ears    Tobacco use disorder    Unspecified visual loss    Past Surgical History:  Procedure Laterality Date   COLONOSCOPY     left shoulder  1990   POLYPECTOMY     Family History  Problem Relation Age of Onset   Cancer Mother    Stroke Sister    Breast cancer Sister    Colitis Neg Hx    Esophageal  cancer Neg Hx    Stomach cancer Neg Hx    Rectal cancer Neg Hx    Colon cancer Neg Hx    Social History   Socioeconomic History   Marital status: Married    Spouse name: Not on file   Number of children: 4   Years of education: Not on file   Highest education level: Not on file  Occupational History   Occupation: retired    Associate Professor: [employer]  Tobacco Use   Smoking status: Some Days    Current packs/day: 0.25    Average packs/day: 0.3 packs/day for 40.0 years (10.0 ttl pk-yrs)    Types: Cigarettes   Smokeless tobacco: Never  Vaping Use   Vaping status: Never Used  Substance and Sexual Activity   Alcohol use: Yes    Alcohol/week: 8.0 standard drinks of alcohol    Types: 8 Cans of beer per week    Comment: weekly.   Drug use: Yes     Frequency: 5.0 times per week    Types: Marijuana    Comment: smokes marijuna every now and then during the week   Sexual activity: Yes  Other Topics Concern   Not on file  Social History Narrative   He is married with 4 children   He is retired from concrete work   Drinks about 18 cans of beer a week   Admits to using some marijuana   He is an intermittent smoker   04/07/2017   Social Drivers of Corporate investment banker Strain: Low Risk  (04/14/2017)   Overall Financial Resource Strain (CARDIA)    Difficulty of Paying Living Expenses: Not hard at all  Food Insecurity: No Food Insecurity (12/11/2023)   Hunger Vital Sign    Worried About Running Out of Food in the Last Year: Never true    Ran Out of Food in the Last Year: Never true  Transportation Needs: No Transportation Needs (12/11/2023)   PRAPARE - Administrator, Civil Service (Medical): No    Lack of Transportation (Non-Medical): No  Physical Activity: Inactive (04/14/2017)   Exercise Vital Sign    Days of Exercise per Week: 0 days    Minutes of Exercise per Session: 0 min  Stress: No Stress Concern Present (04/14/2017)   [employer] of Occupational Health - Occupational Stress Questionnaire    Feeling of Stress : Not at all  Social Connections: Unknown (06/19/2023)   Social Connection and Isolation Panel    Frequency of Communication with Friends and Family: Once a week    Frequency of Social Gatherings with Friends and Family: Once a week    Attends Religious Services: Never    Database administrator or Organizations: Patient declined    Attends Engineer, structural: Patient declined    Marital Status: Married    Tobacco Counseling Ready to quit: Not Answered Counseling given: Not Answered   Clinical Intake:  Pre-visit preparation completed: No  Pain : No/denies pain     BMI - recorded: 19.56 Nutritional Status: BMI of 19-24  Normal Nutritional Risks: None Diabetes:  No  How often do you need to have someone help you when you read instructions, pamphlets, or other written materials from your doctor or pharmacy?: 5 - Always What is the last grade level you completed in school?: 7 grade         Activities of Daily Living    12/11/2023   11:03 AM 06/20/2023  2:00 AM  In your present state of health, do you have any difficulty performing the following activities:  Hearing? 0 0  Vision? 0 0  Difficulty concentrating or making decisions? 0 0  Walking or climbing stairs? 0   Dressing or bathing? 0   Doing errands, shopping? 0   Preparing Food and eating ? N   Using the Toilet? N   In the past six months, have you accidently leaked urine? N   Do you have problems with loss of bowel control? N   Managing your Medications? N   Managing your Finances? N   Housekeeping or managing your Housekeeping? N     Patient Care Team: Cai Anfinson, Roxan BROCKS, NP as PCP - General (Family Medicine) Cloria Annabella CROME, DO (Geriatric Medicine)  Indicate any recent Medical Services you may have received from other than [facility] providers in the past year (date may be approximate).     Assessment:   This is a routine wellness examination for Devereaux.  Hearing/Vision screen Hearing Screening - Comments:: No hearing issues. Vision Screening - Comments:: Last eye exam 3 years ago pt can't read to do in office eye exam.   Goals Addressed             This Visit's Progress    DIET - INCREASE WATER INTAKE   Not on track    Patient will increase water to 3-4 botles a day      Quit Smoking   Not on track    Would like to cut down to 1 pack of cigarettes to last 2 weeks.        Depression Screen    12/11/2023   10:28 AM 12/01/2023   10:45 AM 06/02/2023   10:39 AM 11/27/2022   10:10 AM 09/10/2022   10:38 AM 05/16/2022   10:15 AM 05/25/2021    8:54 AM  PHQ 2/9 Scores  PHQ - 2 Score 0 0 0 0 0 0 0  PHQ- 9 Score    0       Fall Risk    12/11/2023   10:28 AM 12/01/2023    10:45 AM 07/03/2023   10:09 AM 06/02/2023   10:39 AM 11/27/2022   10:10 AM  Fall Risk   Falls in the past year? 0 0 1 1 0  Number falls in past yr: 0 0 0 0 0  Injury with Fall? 0 0 1 0 0  Risk for fall due to : No Fall Risks No Fall Risks History of fall(s)  No Fall Risks  Follow up Falls evaluation completed Falls evaluation completed Falls evaluation completed  Falls evaluation completed    MEDICARE RISK AT HOME: Medicare Risk at Home Any stairs in or around the home?: No If so, are there any without handrails?: No Home free of loose throw rugs in walkways, pet beds, electrical cords, etc?: No Adequate lighting in your home to reduce risk of falls?: Yes Life alert?: No Use of a cane, walker or w/c?: No Grab bars in the bathroom?: No Shower chair or bench in shower?: No Elevated toilet seat or a handicapped toilet?: No  TIMED UP AND GO:  Was the test performed?  Yes  Length of time to ambulate 10 feet: 15 sec Gait slow and steady without use of assistive device    Cognitive Function:    09/10/2022   10:43 AM 04/17/2018   11:31 AM 04/17/2018   11:22 AM 04/14/2017    8:39 AM 04/12/2016  8:46 AM  MMSE - Mini Mental State Exam  Not completed:     --  Orientation to time 5 4 4 5  5    Orientation to Place 5 4 4 4  4    Registration 3 3 3 3  3    Attention/ Calculation 0 0  0  0   Attention/Calculation-comments  patient can not read     Recall 3 1 1  0  3   Language- name 2 objects 2 2 2 2  2    Language- repeat 1 0 0 1 1  Language- follow 3 step command 3 3 3 2  2    Language- read & follow direction 0 0  0  0   Language-read & follow direction-comments  patient can not read or write     Write a sentence 0 0  0  0   Write a sentence-comments  patient can not read or write     Copy design 0 1 1 0  0   Total score 22 18  17  20       Data saved with a previous flowsheet row definition        12/11/2023   10:28 AM 05/16/2022   10:16 AM 05/15/2021    9:41 AM 04/24/2020     3:40 PM  6CIT Screen  What Year? 0 points 0 points 0 points 0 points  What month? 0 points 0 points 0 points 0 points  What time? 0 points 0 points 0 points 0 points  Count back from 20 2 points 4 points 4 points 4 points  Months in reverse 4 points 4 points 4 points 4 points  Repeat phrase 10 points 10 points 10 points 10 points  Total Score 16 points 18 points 18 points 18 points    Immunizations Immunization History  Administered Date(s) Administered   DTaP 07/08/1956   Fluad Quad(high Dose 65+) 03/19/2019, 03/23/2020, 05/25/2021   Fluad Trivalent(High Dose 65+) 06/02/2023   Hep A / Hep B 09/05/2014, 10/10/2014, 03/20/2015   Hepatitis B, ADULT 09/03/2017, 02/11/2018   Hepatitis B, PED/ADOLESCENT 07/29/2017   INFLUENZA, HIGH DOSE SEASONAL PF 02/17/2017, 01/22/2018   Influenza,inj,Quad PF,6+ Mos 03/24/2014, 03/17/2015, 12/21/2015   Moderna SARS-COV2 Booster Vaccination 04/24/2020   Moderna Sars-Covid-2 Vaccination 06/25/2019, 07/26/2019   Pneumococcal Conjugate-13 06/24/2014   Pneumococcal Polysaccharide-23 07/07/2009, 08/03/2012   Td 07/08/1952   Tdap 05/17/2013, 03/06/2018   Varicella 07/07/1957   Zoster, Live 07/07/2004    TDAP status: Up to date  Flu Vaccine status: Due, Education has been provided regarding the importance of this vaccine. Advised may receive this vaccine at local pharmacy or Health Dept. Aware to provide a copy of the vaccination record if obtained from local pharmacy or Health Dept. Verbalized acceptance and understanding.  Pneumococcal vaccine status: Due, Education has been provided regarding the importance of this vaccine. Advised may receive this vaccine at local pharmacy or Health Dept. Aware to provide a copy of the vaccination record if obtained from local pharmacy or Health Dept. Verbalized acceptance and understanding.  Covid-19 vaccine status: Information provided on how to obtain vaccines.   Qualifies for Shingles Vaccine? Yes   Zostavax  completed No   Shingrix Completed?: No.    Education has been provided regarding the importance of this vaccine. Patient has been advised to call insurance company to determine out of pocket expense if they have not yet received this vaccine. Advised may also receive vaccine at local pharmacy or Health Dept. Verbalized acceptance  and understanding.  Screening Tests Health Maintenance  Topic Date Due   INFLUENZA VACCINE  01/01/2024 (Originally 11/14/2023)   COVID-19 Vaccine (4 - 2024-25 season) 01/01/2024 (Originally 12/15/2022)   Medicare Annual Wellness (AWV)  12/10/2024   DTaP/Tdap/Td (5 - Td or Tdap) 03/06/2028   Pneumococcal Vaccine: 50+ Years  Completed   Hepatitis C Screening  Completed   HPV VACCINES  Aged Out   Meningococcal B Vaccine  Aged Out   Hepatitis B Vaccines 19-59 Average Risk  Discontinued   Zoster Vaccines- Shingrix  Discontinued    Health Maintenance  There are no preventive care reminders to display for this patient.   Colorectal cancer screening: No longer required.   Lung Cancer Screening: (Low Dose CT Chest recommended if Age 48-80 years, 20 pack-year currently smoking OR have quit w/in 15years.) does qualify.   Lung Cancer Screening Referral: Had CT scan 06/19/2023  Additional Screening:  Hepatitis C Screening: does qualify; Completed yes  Vision Screening: Recommended annual ophthalmology exams for early detection of glaucoma and other disorders of the eye. Is the patient up to date with their annual eye exam?  No  Who is the provider or what is the name of the office in which the patient attends annual eye exams? Wife will book appointment  If pt is not established with a provider, would they like to be referred to a provider to establish care? No .   Dental Screening: Recommended annual dental exams for proper oral hygiene  Diabetic Foot Exam: Diabetic Foot Exam: Completed N/A   Community Resource Referral / Chronic Care Management: CRR required this  visit?  No   CCM required this visit?  No     Plan:     I have personally reviewed and noted the following in the patient's chart:   Medical and social history Use of alcohol, tobacco or illicit drugs  Current medications and supplements including opioid prescriptions. Patient is not currently taking opioid prescriptions. Functional ability and status Nutritional status Physical activity Advanced directives List of other physicians Hospitalizations, surgeries, and ER visits in previous 12 months Vitals Screenings to include cognitive, depression, and falls Referrals and appointments  In addition, I have reviewed and discussed with patient certain preventive protocols, quality metrics, and best practice recommendations. A written personalized care plan for preventive services as well as general preventive health recommendations were provided to patient.     Roxan JAYSON Plough, NP   12/11/2023   After Visit Summary: (In Person-Printed) AVS printed and given to the patient  Nurse Notes: Advised to get Influenza vaccine and COVID-19 vaccine at the Pharmacy

## 2023-12-11 NOTE — Patient Instructions (Signed)
 Ryan Ortega , Thank you for taking time to come for your Medicare Wellness Visit. I appreciate your ongoing commitment to your health goals. Please review the following plan we discussed and let me know if I can assist you in the future.   Screening recommendations/referrals: Colonoscopy : N/A  Recommended yearly ophthalmology/optometry visit for glaucoma screening and checkup Recommended yearly dental visit for hygiene and checkup  Vaccinations: Influenza vaccine due annually in September/October Pneumococcal vaccine : Up to date  Tdap vaccine  : Up to date  Shingles vaccine :     Advanced directives: No   Conditions/risks identified: advanced age (>41men, >57 women);male gender;smoking/ tobacco exposure  Next appointment: 1 year   Preventive Care 27 Years and Older, Male Preventive care refers to lifestyle choices and visits with your health care provider that can promote health and wellness. What does preventive care include? A yearly physical exam. This is also called an annual well check. Dental exams once or twice a year. Routine eye exams. Ask your health care provider how often you should have your eyes checked. Personal lifestyle choices, including: Daily care of your teeth and gums. Regular physical activity. Eating a healthy diet. Avoiding tobacco and drug use. Limiting alcohol use. Practicing safe sex. Taking low doses of aspirin every day. Taking vitamin and mineral supplements as recommended by your health care provider. What happens during an annual well check? The services and screenings done by your health care provider during your annual well check will depend on your age, overall health, lifestyle risk factors, and family history of disease. Counseling  Your health care provider may ask you questions about your: Alcohol use. Tobacco use. Drug use. Emotional well-being. Home and relationship well-being. Sexual activity. Eating habits. History of  falls. Memory and ability to understand (cognition). Work and work Astronomer. Screening  You may have the following tests or measurements: Height, weight, and BMI. Blood pressure. Lipid and cholesterol levels. These may be checked every 5 years, or more frequently if you are over 35 years old. Skin check. Lung cancer screening. You may have this screening every year starting at age 63 if you have a 30-pack-year history of smoking and currently smoke or have quit within the past 15 years. Fecal occult blood test (FOBT) of the stool. You may have this test every year starting at age 54. Flexible sigmoidoscopy or colonoscopy. You may have a sigmoidoscopy every 5 years or a colonoscopy every 10 years starting at age 36. Prostate cancer screening. Recommendations will vary depending on your family history and other risks. Hepatitis C blood test. Hepatitis B blood test. Sexually transmitted disease (STD) testing. Diabetes screening. This is done by checking your blood sugar (glucose) after you have not eaten for a while (fasting). You may have this done every 1-3 years. Abdominal aortic aneurysm (AAA) screening. You may need this if you are a current or former smoker. Osteoporosis. You may be screened starting at age 53 if you are at high risk. Talk with your health care provider about your test results, treatment options, and if necessary, the need for more tests. Vaccines  Your health care provider may recommend certain vaccines, such as: Influenza vaccine. This is recommended every year. Tetanus, diphtheria, and acellular pertussis (Tdap, Td) vaccine. You may need a Td booster every 10 years. Zoster vaccine. You may need this after age 69. Pneumococcal 13-valent conjugate (PCV13) vaccine. One dose is recommended after age 107. Pneumococcal polysaccharide (PPSV23) vaccine. One dose is recommended after age  65. Talk to your health care provider about which screenings and vaccines you need and  how often you need them. This information is not intended to replace advice given to you by your health care provider. Make sure you discuss any questions you have with your health care provider. Document Released: 04/28/2015 Document Revised: 12/20/2015 Document Reviewed: 01/31/2015 Elsevier Interactive Patient Education  2017 [employer].  Fall Prevention in the Home Falls can cause injuries. They can happen to people of all ages. There are many things you can do to make your home safe and to help prevent falls. What can I do on the outside of my home? Regularly fix the edges of walkways and driveways and fix any cracks. Remove anything that might make you trip as you walk through a door, such as a raised step or threshold. Trim any bushes or trees on the path to your home. Use bright outdoor lighting. Clear any walking paths of anything that might make someone trip, such as rocks or tools. Regularly check to see if handrails are loose or broken. Make sure that both sides of any steps have handrails. Any raised decks and porches should have guardrails on the edges. Have any leaves, snow, or ice cleared regularly. Use sand or salt on walking paths during winter. Clean up any spills in your garage right away. This includes oil or grease spills. What can I do in the bathroom? Use night lights. Install grab bars by the toilet and in the tub and shower. Do not use towel bars as grab bars. Use non-skid mats or decals in the tub or shower. If you need to sit down in the shower, use a plastic, non-slip stool. Keep the floor dry. Clean up any water that spills on the floor as soon as it happens. Remove soap buildup in the tub or shower regularly. Attach bath mats securely with double-sided non-slip rug tape. Do not have throw rugs and other things on the floor that can make you trip. What can I do in the bedroom? Use night lights. Make sure that you have a light by your bed that is easy to  reach. Do not use any sheets or blankets that are too big for your bed. They should not hang down onto the floor. Have a firm chair that has side arms. You can use this for support while you get dressed. Do not have throw rugs and other things on the floor that can make you trip. What can I do in the kitchen? Clean up any spills right away. Avoid walking on wet floors. Keep items that you use a lot in easy-to-reach places. If you need to reach something above you, use a strong step stool that has a grab bar. Keep electrical cords out of the way. Do not use floor polish or wax that makes floors slippery. If you must use wax, use non-skid floor wax. Do not have throw rugs and other things on the floor that can make you trip. What can I do with my stairs? Do not leave any items on the stairs. Make sure that there are handrails on both sides of the stairs and use them. Fix handrails that are broken or loose. Make sure that handrails are as long as the stairways. Check any carpeting to make sure that it is firmly attached to the stairs. Fix any carpet that is loose or worn. Avoid having throw rugs at the top or bottom of the stairs. If you do have  throw rugs, attach them to the floor with carpet tape. Make sure that you have a light switch at the top of the stairs and the bottom of the stairs. If you do not have them, ask someone to add them for you. What else can I do to help prevent falls? Wear shoes that: Do not have high heels. Have rubber bottoms. Are comfortable and fit you well. Are closed at the toe. Do not wear sandals. If you use a stepladder: Make sure that it is fully opened. Do not climb a closed stepladder. Make sure that both sides of the stepladder are locked into place. Ask someone to hold it for you, if possible. Clearly mark and make sure that you can see: Any grab bars or handrails. First and last steps. Where the edge of each step is. Use tools that help you move  around (mobility aids) if they are needed. These include: Canes. Walkers. Scooters. Crutches. Turn on the lights when you go into a dark area. Replace any light bulbs as soon as they burn out. Set up your furniture so you have a clear path. Avoid moving your furniture around. If any of your floors are uneven, fix them. If there are any pets around you, be aware of where they are. Review your medicines with your doctor. Some medicines can make you feel dizzy. This can increase your chance of falling. Ask your doctor what other things that you can do to help prevent falls. This information is not intended to replace advice given to you by your health care provider. Make sure you discuss any questions you have with your health care provider. Document Released: 01/26/2009 Document Revised: 09/07/2015 Document Reviewed: 05/06/2014 Elsevier Interactive Patient Education  2017 [employer].

## 2024-01-12 ENCOUNTER — Other Ambulatory Visit: Payer: Self-pay | Admitting: Family

## 2024-01-12 ENCOUNTER — Other Ambulatory Visit

## 2024-01-12 DIAGNOSIS — R634 Abnormal weight loss: Secondary | ICD-10-CM | POA: Diagnosis not present

## 2024-01-12 DIAGNOSIS — Z13220 Encounter for screening for lipoid disorders: Secondary | ICD-10-CM | POA: Diagnosis not present

## 2024-01-12 DIAGNOSIS — J432 Centrilobular emphysema: Secondary | ICD-10-CM | POA: Diagnosis not present

## 2024-01-12 DIAGNOSIS — K219 Gastro-esophageal reflux disease without esophagitis: Secondary | ICD-10-CM | POA: Diagnosis not present

## 2024-01-13 LAB — CBC WITH DIFFERENTIAL/PLATELET
Absolute Lymphocytes: 1385 {cells}/uL (ref 850–3900)
Absolute Monocytes: 317 {cells}/uL (ref 200–950)
Basophils Absolute: 28 {cells}/uL (ref 0–200)
Basophils Relative: 0.6 %
Eosinophils Absolute: 110 {cells}/uL (ref 15–500)
Eosinophils Relative: 2.4 %
HCT: 33.9 % — ABNORMAL LOW (ref 38.5–50.0)
Hemoglobin: 10.2 g/dL — ABNORMAL LOW (ref 13.2–17.1)
MCH: 26.2 pg — ABNORMAL LOW (ref 27.0–33.0)
MCHC: 30.1 g/dL — ABNORMAL LOW (ref 32.0–36.0)
MCV: 86.9 fL (ref 80.0–100.0)
MPV: 10.3 fL (ref 7.5–12.5)
Monocytes Relative: 6.9 %
Neutro Abs: 2760 {cells}/uL (ref 1500–7800)
Neutrophils Relative %: 60 %
Platelets: 324 Thousand/uL (ref 140–400)
RBC: 3.9 Million/uL — ABNORMAL LOW (ref 4.20–5.80)
RDW: 15.8 % — ABNORMAL HIGH (ref 11.0–15.0)
Total Lymphocyte: 30.1 %
WBC: 4.6 Thousand/uL (ref 3.8–10.8)

## 2024-01-13 LAB — COMPREHENSIVE METABOLIC PANEL WITH GFR
AG Ratio: 1.2 (calc) (ref 1.0–2.5)
ALT: 11 U/L (ref 9–46)
AST: 14 U/L (ref 10–35)
Albumin: 4.2 g/dL (ref 3.6–5.1)
Alkaline phosphatase (APISO): 67 U/L (ref 35–144)
BUN: 12 mg/dL (ref 7–25)
CO2: 25 mmol/L (ref 20–32)
Calcium: 9.8 mg/dL (ref 8.6–10.3)
Chloride: 102 mmol/L (ref 98–110)
Creat: 0.79 mg/dL (ref 0.70–1.28)
Globulin: 3.4 g/dL (ref 1.9–3.7)
Glucose, Bld: 108 mg/dL (ref 65–139)
Potassium: 4 mmol/L (ref 3.5–5.3)
Sodium: 136 mmol/L (ref 135–146)
Total Bilirubin: 0.6 mg/dL (ref 0.2–1.2)
Total Protein: 7.6 g/dL (ref 6.1–8.1)
eGFR: 90 mL/min/1.73m2 (ref >=60)

## 2024-01-13 LAB — LIPID PANEL
Cholesterol: 116 mg/dL (ref <200)
HDL: 48 mg/dL (ref >=40)
LDL Cholesterol (Calc): 53 mg/dL
Non-HDL Cholesterol (Calc): 68 mg/dL (ref <130)
Total CHOL/HDL Ratio: 2.4 (calc) (ref <5.0)
Triglycerides: 70 mg/dL (ref <150)

## 2024-01-13 LAB — TSH: TSH: 0.69 m[IU]/L (ref 0.40–4.50)

## 2024-01-16 ENCOUNTER — Ambulatory Visit: Payer: Self-pay | Admitting: Family

## 2024-01-19 ENCOUNTER — Telehealth: Payer: Self-pay

## 2024-01-19 NOTE — Telephone Encounter (Signed)
 Copied from CRM [id number]. Topic: Clinical - Lab/Test Results >> Jan 16, 2024  3:10 PM Susanna ORN wrote: Reason for CRM: Patient's wife, [name], called to get lab results. States patient is acting like he's not feeling too well. Advised that nurse did call and didn't receive any answer & a voicemail was left. Read the lab results per the chart note. Asked her if patient was experiencing any symptoms and offered nurse triage & she stated no. Just stated that he's half eating but she figured if it's an emergency, then she would call 911. Please give her a call back at [phone] or she stated that if she can't be reached to contact patient's son, Phinehas at [phone].

## 2024-01-19 NOTE — Telephone Encounter (Signed)
 Spoke with patients spouse and re-discussed labs, spouse verbalized understanding. I asked about the portion of message stating patient doesn't feel well, [name] stated patient is feeling better now, and she really could not explain how he had felt other than not well, declined appointment at this time.

## 2024-05-18 ENCOUNTER — Telehealth: Payer: Self-pay

## 2024-05-18 DIAGNOSIS — R634 Abnormal weight loss: Secondary | ICD-10-CM

## 2024-05-18 MED ORDER — MIRTAZAPINE 15 MG PO TABS: 15.0000 mg | ORAL_TABLET | Freq: Every day | ORAL | 1 refills | Status: AC

## 2024-05-18 NOTE — Telephone Encounter (Signed)
 Copied from CRM [id number]. Topic: Clinical - Medication Refill >> May 17, 2024  2:13 PM Carrielelia G wrote: Medication: mirtazapine  (REMERON ) 15 MG tablet  Has the patient contacted their pharmacy? No (Agent: If no, request that the patient contact the pharmacy for the refill. If patient does not wish to contact the pharmacy document the reason why and proceed with request.) (Agent: If yes, when and what did the pharmacy advise?)  This is the patient's preferred pharmacy:  [id number] [address], [place] - [address] [place] [address] Phone: [phone] Fax: [phone]  WIs this the correct pharmacy for this prescription? Yes If no, delete pharmacy and type the correct one.   HIs the patient out of the medication? Yes  Has the patient been seen for an appointment in the last year OR does the patient have an upcoming appointment? Yes  Can we respond through MyChart? No  Agent: Please be advised that Rx refills may take up to 3 business days. We ask that you follow-up with your pharmacy.

## 2024-05-18 NOTE — Telephone Encounter (Signed)
 This encounter was created in error - please disregard.

## 2024-06-02 ENCOUNTER — Ambulatory Visit: Payer: Self-pay | Admitting: Family

## 2024-12-13 ENCOUNTER — Ambulatory Visit: Payer: Self-pay | Admitting: Family
